# Patient Record
Sex: Female | Born: 1935 | Race: White | Hispanic: No | Smoking: Former smoker
Health system: Southern US, Community
[De-identification: ages and names within clinical notes are randomized; demographics above are authoritative.]

## PROBLEM LIST (undated history)

## (undated) DIAGNOSIS — I5032 Chronic diastolic (congestive) heart failure: Secondary | ICD-10-CM

## (undated) DIAGNOSIS — I10 Essential (primary) hypertension: Secondary | ICD-10-CM

## (undated) DIAGNOSIS — I6529 Occlusion and stenosis of unspecified carotid artery: Secondary | ICD-10-CM

## (undated) DIAGNOSIS — N200 Calculus of kidney: Secondary | ICD-10-CM

## (undated) DIAGNOSIS — E119 Type 2 diabetes mellitus without complications: Secondary | ICD-10-CM

## (undated) DIAGNOSIS — E785 Hyperlipidemia, unspecified: Secondary | ICD-10-CM

## (undated) DIAGNOSIS — T7840XA Allergy, unspecified, initial encounter: Secondary | ICD-10-CM

## (undated) DIAGNOSIS — Z8619 Personal history of other infectious and parasitic diseases: Secondary | ICD-10-CM

## (undated) DIAGNOSIS — D179 Benign lipomatous neoplasm, unspecified: Secondary | ICD-10-CM

## (undated) DIAGNOSIS — M47812 Spondylosis without myelopathy or radiculopathy, cervical region: Secondary | ICD-10-CM

## (undated) DIAGNOSIS — I639 Cerebral infarction, unspecified: Secondary | ICD-10-CM

## (undated) HISTORY — PX: LUMBAR EPIDURAL INJECTION: SHX1980

## (undated) HISTORY — DX: Personal history of other infectious and parasitic diseases: Z86.19

## (undated) HISTORY — DX: Cerebral infarction, unspecified: I63.9

## (undated) HISTORY — DX: Allergy, unspecified, initial encounter: T78.40XA

## (undated) HISTORY — PX: OTHER SURGICAL HISTORY: SHX169

## (undated) HISTORY — DX: Benign lipomatous neoplasm, unspecified: D17.9

## (undated) HISTORY — DX: Spondylosis without myelopathy or radiculopathy, cervical region: M47.812

## (undated) HISTORY — DX: Occlusion and stenosis of unspecified carotid artery: I65.29

## (undated) HISTORY — DX: Essential (primary) hypertension: I10

## (undated) HISTORY — DX: Hyperlipidemia, unspecified: E78.5

## (undated) HISTORY — DX: Calculus of kidney: N20.0

---
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
## 1961-12-27 HISTORY — PX: HERNIA REPAIR: SHX51

## 1971-12-28 HISTORY — PX: ABDOMINAL HYSTERECTOMY: SHX81

## 1973-12-27 HISTORY — PX: LUMBAR DISC SURGERY: SHX700

## 1986-12-27 HISTORY — PX: CHOLECYSTECTOMY: SHX55

## 1986-12-27 HISTORY — PX: GALLBLADDER SURGERY: SHX652

## 1988-12-27 HISTORY — PX: ANKLE RECONSTRUCTION: SHX1151

## 1992-12-27 HISTORY — PX: ROTATOR CUFF REPAIR: SHX139

## 1998-06-12 ENCOUNTER — Encounter: Admission: RE | Admit: 1998-06-12 | Discharge: 1998-09-10 | Payer: Self-pay | Admitting: Family Medicine

## 1998-12-27 HISTORY — PX: OTHER SURGICAL HISTORY: SHX169

## 1998-12-27 HISTORY — PX: SPINE SURGERY: SHX786

## 1999-10-14 ENCOUNTER — Encounter: Admission: RE | Admit: 1999-10-14 | Discharge: 1999-10-14 | Payer: Self-pay | Admitting: Family Medicine

## 1999-10-14 ENCOUNTER — Encounter: Payer: Self-pay | Admitting: Neurological Surgery

## 1999-11-06 ENCOUNTER — Encounter: Payer: Self-pay | Admitting: Neurological Surgery

## 1999-11-10 ENCOUNTER — Inpatient Hospital Stay (HOSPITAL_COMMUNITY): Admission: RE | Admit: 1999-11-10 | Discharge: 1999-11-11 | Payer: Self-pay | Admitting: Neurological Surgery

## 1999-11-10 ENCOUNTER — Encounter: Payer: Self-pay | Admitting: Neurological Surgery

## 1999-12-03 ENCOUNTER — Encounter: Admission: RE | Admit: 1999-12-03 | Discharge: 1999-12-03 | Payer: Self-pay | Admitting: Neurological Surgery

## 1999-12-03 ENCOUNTER — Encounter: Payer: Self-pay | Admitting: Neurological Surgery

## 2000-01-11 ENCOUNTER — Encounter: Admission: RE | Admit: 2000-01-11 | Discharge: 2000-01-11 | Payer: Self-pay | Admitting: Neurological Surgery

## 2000-01-11 ENCOUNTER — Encounter: Payer: Self-pay | Admitting: Neurological Surgery

## 2000-08-11 ENCOUNTER — Encounter: Payer: Self-pay | Admitting: Orthopedic Surgery

## 2000-08-11 ENCOUNTER — Encounter: Admission: RE | Admit: 2000-08-11 | Discharge: 2000-08-11 | Payer: Self-pay | Admitting: Orthopedic Surgery

## 2000-12-27 HISTORY — PX: STOMACH SURGERY: SHX791

## 2001-08-18 ENCOUNTER — Encounter: Admission: RE | Admit: 2001-08-18 | Discharge: 2001-08-18 | Payer: Self-pay | Admitting: Orthopedic Surgery

## 2001-08-18 ENCOUNTER — Encounter: Payer: Self-pay | Admitting: Orthopedic Surgery

## 2001-09-27 ENCOUNTER — Ambulatory Visit: Admission: RE | Admit: 2001-09-27 | Discharge: 2001-09-27 | Payer: Self-pay | Admitting: Gynecology

## 2001-10-11 ENCOUNTER — Encounter: Payer: Self-pay | Admitting: Gynecology

## 2001-10-17 ENCOUNTER — Encounter (INDEPENDENT_AMBULATORY_CARE_PROVIDER_SITE_OTHER): Payer: Self-pay

## 2001-10-17 ENCOUNTER — Inpatient Hospital Stay (HOSPITAL_COMMUNITY): Admission: RE | Admit: 2001-10-17 | Discharge: 2001-10-20 | Payer: Self-pay | Admitting: Gynecology

## 2001-10-17 ENCOUNTER — Encounter (INDEPENDENT_AMBULATORY_CARE_PROVIDER_SITE_OTHER): Payer: Self-pay | Admitting: Specialist

## 2001-12-27 HISTORY — PX: SPINE SURGERY: SHX786

## 2002-05-07 ENCOUNTER — Inpatient Hospital Stay (HOSPITAL_COMMUNITY): Admission: RE | Admit: 2002-05-07 | Discharge: 2002-05-08 | Payer: Self-pay | Admitting: Neurological Surgery

## 2002-05-07 ENCOUNTER — Encounter: Payer: Self-pay | Admitting: Neurological Surgery

## 2002-05-18 ENCOUNTER — Ambulatory Visit (HOSPITAL_COMMUNITY): Admission: RE | Admit: 2002-05-18 | Discharge: 2002-05-18 | Payer: Self-pay | Admitting: Family Medicine

## 2002-05-18 ENCOUNTER — Encounter: Payer: Self-pay | Admitting: Family Medicine

## 2003-10-28 ENCOUNTER — Ambulatory Visit (HOSPITAL_COMMUNITY): Admission: RE | Admit: 2003-10-28 | Discharge: 2003-10-28 | Payer: Self-pay | Admitting: Gastroenterology

## 2003-12-28 HISTORY — PX: FOOT FUSION: SHX956

## 2004-01-14 ENCOUNTER — Ambulatory Visit (HOSPITAL_BASED_OUTPATIENT_CLINIC_OR_DEPARTMENT_OTHER): Admission: RE | Admit: 2004-01-14 | Discharge: 2004-01-14 | Payer: Self-pay | Admitting: Orthopedic Surgery

## 2004-01-14 ENCOUNTER — Ambulatory Visit (HOSPITAL_COMMUNITY): Admission: RE | Admit: 2004-01-14 | Discharge: 2004-01-14 | Payer: Self-pay | Admitting: Orthopedic Surgery

## 2004-11-29 ENCOUNTER — Inpatient Hospital Stay (HOSPITAL_COMMUNITY): Admission: EM | Admit: 2004-11-29 | Discharge: 2004-11-30 | Payer: Self-pay | Admitting: Emergency Medicine

## 2004-12-14 ENCOUNTER — Ambulatory Visit: Payer: Self-pay

## 2004-12-14 ENCOUNTER — Ambulatory Visit: Payer: Self-pay | Admitting: Cardiology

## 2004-12-24 ENCOUNTER — Ambulatory Visit: Payer: Self-pay

## 2006-06-02 ENCOUNTER — Encounter: Admission: RE | Admit: 2006-06-02 | Discharge: 2006-06-02 | Payer: Self-pay | Admitting: Neurological Surgery

## 2007-02-03 ENCOUNTER — Ambulatory Visit: Payer: Self-pay | Admitting: Cardiology

## 2007-02-03 ENCOUNTER — Ambulatory Visit: Payer: Self-pay

## 2007-06-21 ENCOUNTER — Encounter (INDEPENDENT_AMBULATORY_CARE_PROVIDER_SITE_OTHER): Payer: Self-pay | Admitting: Family Medicine

## 2007-06-21 ENCOUNTER — Ambulatory Visit: Payer: Self-pay | Admitting: Vascular Surgery

## 2007-06-21 ENCOUNTER — Ambulatory Visit (HOSPITAL_COMMUNITY): Admission: RE | Admit: 2007-06-21 | Discharge: 2007-06-22 | Payer: Self-pay | Admitting: Family Medicine

## 2007-10-26 ENCOUNTER — Ambulatory Visit: Payer: Self-pay

## 2008-02-21 ENCOUNTER — Ambulatory Visit: Payer: Self-pay | Admitting: Cardiology

## 2008-10-16 ENCOUNTER — Encounter: Admission: RE | Admit: 2008-10-16 | Discharge: 2008-10-16 | Payer: Self-pay | Admitting: Orthopedic Surgery

## 2008-10-29 ENCOUNTER — Ambulatory Visit: Payer: Self-pay

## 2008-11-26 DIAGNOSIS — Z8619 Personal history of other infectious and parasitic diseases: Secondary | ICD-10-CM

## 2008-11-26 HISTORY — DX: Personal history of other infectious and parasitic diseases: Z86.19

## 2008-12-11 ENCOUNTER — Ambulatory Visit: Payer: Self-pay

## 2008-12-11 ENCOUNTER — Encounter (INDEPENDENT_AMBULATORY_CARE_PROVIDER_SITE_OTHER): Payer: Self-pay | Admitting: Family Medicine

## 2008-12-27 HISTORY — PX: SPINE SURGERY: SHX786

## 2009-02-21 ENCOUNTER — Ambulatory Visit: Payer: Self-pay | Admitting: Cardiology

## 2009-02-28 ENCOUNTER — Ambulatory Visit: Payer: Self-pay | Admitting: Cardiology

## 2009-02-28 LAB — CONVERTED CEMR LAB
Calcium: 10.1 mg/dL (ref 8.4–10.5)
GFR calc Af Amer: 63 mL/min
GFR calc non Af Amer: 52 mL/min
Potassium: 3.6 meq/L (ref 3.5–5.1)
Sodium: 143 meq/L (ref 135–145)

## 2009-06-11 ENCOUNTER — Encounter: Admission: RE | Admit: 2009-06-11 | Discharge: 2009-06-11 | Payer: Self-pay | Admitting: Neurological Surgery

## 2009-08-21 ENCOUNTER — Inpatient Hospital Stay (HOSPITAL_COMMUNITY): Admission: AD | Admit: 2009-08-21 | Discharge: 2009-08-22 | Payer: Self-pay | Admitting: Neurological Surgery

## 2009-11-05 ENCOUNTER — Ambulatory Visit: Payer: Self-pay

## 2009-11-05 ENCOUNTER — Encounter: Payer: Self-pay | Admitting: Cardiology

## 2009-12-03 ENCOUNTER — Encounter: Admission: RE | Admit: 2009-12-03 | Discharge: 2009-12-03 | Payer: Self-pay | Admitting: Family Medicine

## 2009-12-04 ENCOUNTER — Encounter: Admission: RE | Admit: 2009-12-04 | Discharge: 2009-12-04 | Payer: Self-pay | Admitting: Family Medicine

## 2009-12-27 DIAGNOSIS — I639 Cerebral infarction, unspecified: Secondary | ICD-10-CM

## 2009-12-27 DIAGNOSIS — I5032 Chronic diastolic (congestive) heart failure: Secondary | ICD-10-CM

## 2009-12-27 HISTORY — DX: Cerebral infarction, unspecified: I63.9

## 2009-12-27 HISTORY — DX: Chronic diastolic (congestive) heart failure: I50.32

## 2010-01-08 ENCOUNTER — Inpatient Hospital Stay (HOSPITAL_COMMUNITY): Admission: EM | Admit: 2010-01-08 | Discharge: 2010-01-16 | Payer: Self-pay | Admitting: Emergency Medicine

## 2010-01-08 ENCOUNTER — Ambulatory Visit: Payer: Self-pay | Admitting: Cardiovascular Disease

## 2010-01-09 ENCOUNTER — Ambulatory Visit: Payer: Self-pay | Admitting: Infectious Disease

## 2010-01-14 ENCOUNTER — Ambulatory Visit: Payer: Self-pay | Admitting: Vascular Surgery

## 2010-01-15 ENCOUNTER — Encounter: Payer: Self-pay | Admitting: Infectious Disease

## 2010-01-15 ENCOUNTER — Ambulatory Visit: Payer: Self-pay | Admitting: Internal Medicine

## 2010-01-15 ENCOUNTER — Encounter (INDEPENDENT_AMBULATORY_CARE_PROVIDER_SITE_OTHER): Payer: Self-pay | Admitting: Pediatrics

## 2010-01-15 ENCOUNTER — Ambulatory Visit: Payer: Self-pay | Admitting: Physical Medicine & Rehabilitation

## 2010-01-16 ENCOUNTER — Ambulatory Visit: Payer: Self-pay | Admitting: Physical Medicine & Rehabilitation

## 2010-01-16 ENCOUNTER — Inpatient Hospital Stay (HOSPITAL_COMMUNITY)
Admission: EM | Admit: 2010-01-16 | Discharge: 2010-02-06 | Payer: Self-pay | Admitting: Physical Medicine & Rehabilitation

## 2010-01-19 ENCOUNTER — Encounter: Payer: Self-pay | Admitting: Physical Medicine & Rehabilitation

## 2010-01-31 ENCOUNTER — Encounter: Payer: Self-pay | Admitting: Internal Medicine

## 2010-02-03 ENCOUNTER — Ambulatory Visit: Payer: Self-pay | Admitting: Infectious Disease

## 2010-02-08 ENCOUNTER — Telehealth: Payer: Self-pay | Admitting: Infectious Disease

## 2010-02-08 DIAGNOSIS — M519 Unspecified thoracic, thoracolumbar and lumbosacral intervertebral disc disorder: Secondary | ICD-10-CM

## 2010-02-08 DIAGNOSIS — B029 Zoster without complications: Secondary | ICD-10-CM | POA: Insufficient documentation

## 2010-02-18 ENCOUNTER — Encounter: Payer: Self-pay | Admitting: Infectious Disease

## 2010-02-23 ENCOUNTER — Encounter: Payer: Self-pay | Admitting: Infectious Diseases

## 2010-02-26 ENCOUNTER — Encounter: Payer: Self-pay | Admitting: Infectious Disease

## 2010-03-02 ENCOUNTER — Ambulatory Visit: Payer: Self-pay | Admitting: Infectious Disease

## 2010-03-02 DIAGNOSIS — M5030 Other cervical disc degeneration, unspecified cervical region: Secondary | ICD-10-CM | POA: Insufficient documentation

## 2010-03-02 DIAGNOSIS — I6350 Cerebral infarction due to unspecified occlusion or stenosis of unspecified cerebral artery: Secondary | ICD-10-CM | POA: Insufficient documentation

## 2010-03-02 DIAGNOSIS — I82409 Acute embolism and thrombosis of unspecified deep veins of unspecified lower extremity: Secondary | ICD-10-CM | POA: Insufficient documentation

## 2010-03-02 DIAGNOSIS — M5137 Other intervertebral disc degeneration, lumbosacral region: Secondary | ICD-10-CM | POA: Insufficient documentation

## 2010-03-20 ENCOUNTER — Encounter
Admission: RE | Admit: 2010-03-20 | Discharge: 2010-03-25 | Payer: Self-pay | Admitting: Physical Medicine & Rehabilitation

## 2010-03-25 ENCOUNTER — Ambulatory Visit: Payer: Self-pay | Admitting: Physical Medicine & Rehabilitation

## 2010-05-27 ENCOUNTER — Ambulatory Visit (HOSPITAL_BASED_OUTPATIENT_CLINIC_OR_DEPARTMENT_OTHER): Admission: RE | Admit: 2010-05-27 | Discharge: 2010-05-27 | Payer: Self-pay | Admitting: Urology

## 2010-06-05 ENCOUNTER — Encounter: Payer: Self-pay | Admitting: Cardiology

## 2010-06-15 ENCOUNTER — Ambulatory Visit (HOSPITAL_COMMUNITY): Admission: RE | Admit: 2010-06-15 | Discharge: 2010-06-15 | Payer: Self-pay | Admitting: Urology

## 2010-08-18 ENCOUNTER — Encounter: Payer: Self-pay | Admitting: Cardiology

## 2010-08-20 ENCOUNTER — Ambulatory Visit: Payer: Self-pay

## 2010-08-20 ENCOUNTER — Encounter: Payer: Self-pay | Admitting: Cardiovascular Disease

## 2010-09-01 ENCOUNTER — Encounter: Admission: RE | Admit: 2010-09-01 | Discharge: 2010-09-01 | Payer: Self-pay | Admitting: Emergency Medicine

## 2010-10-29 ENCOUNTER — Encounter: Admission: RE | Admit: 2010-10-29 | Discharge: 2010-10-29 | Payer: Self-pay | Admitting: Family Medicine

## 2010-12-27 DIAGNOSIS — N200 Calculus of kidney: Secondary | ICD-10-CM

## 2010-12-27 HISTORY — DX: Calculus of kidney: N20.0

## 2011-01-26 NOTE — Assessment & Plan Note (Signed)
Summary: [facility] need chart inf in spine/kam   Primary Provider:  Blomgren  CC:  follow up from hospital infection in spine.  History of Present Illness: 75 year old lady with PMHX  significant for carotid stenosis, cerebrovascular  disease, hypertension, hyperlipidemia, cervical spondylosis,  radiculopathy, and myelopathy who developed a zoster rash in mid  December.  She was treated with Depo-Medrol injection, Valtrex, Lidoderm   patches and prednisone.  Neuropathic pain worsened.  In addition to  this, she developed progressive weakness and was unable to walk. She was admitted to the hospitalist service and given IV acyclovir as welll as systemic unasyn for what was believed to be superinfection of her VZV. I  took her offf antibacterial therapy and continued acyclovir and obtained MRI of spine to exclude evidence of VZV related myelitis. Her MRI instead was suggestive of L3-4 diskitis wit " a change in appearance of the degenerated L3-4 disc which now has a fluidsignal intensity.  The irregularity of the surrounding endplate  does not appear to have progressed since prior exam however,  diskitis is of concern given the additional finding of new edema within the medial aspect of the adjacent left psoas muscle. SHe underwent aspiration of this disk space twice off antibiotics and both aspirates failed to yiedl an organims on bacterial culture. Her AFB and fungal cultures from the 2nd aspirate also faield to yield an organism. I ultimately decided to treate her for possible diskitis with cirpofloxacin and vancomycin (there had been concern for beta lactams possibly having caused thrombocytopenia, though with retrospect I thimnk ie may have been her acyclovir. She also completed at 21 day course of acyclovir and valtrex for possible VZV myelitis though on MRI there really was no good evidence for this. She went to the rehab floor and was ultimately dc to home.  IN the interim she was also found to have DVT and  is on coumadin for this at present. She has regained majority of her strenght and can actually walk witout a walker now though she does not actually do this regularly --she demonstrates this for me. She has  had no fevers, chills, nauasea or maliase. She still does have back pain with prolonged standing and inquires if she could ask Dr. Danielle Dess to inject her back with steroids again. I explained that this would entail an increased risk of infection due to insertion of needle and injection of immunosuppressant but that Iwould pass on my note to him and would welcome his review of this case as well.  Preventive Screening-Counseling & Management  Alcohol-Tobacco     Alcohol drinks/day: 0     Smoking Status: quit > 6 months  Caffeine-Diet-Exercise     Caffeine use/day: coffee one day  Past History:  Past Medical History:  1. Bilateral carotid stenosis.   2. Cerebrovascular disease.   3. Hypertension.   4. Hyperlipidemia.   5. DVT.   6. Cervical spondylosis with radiculopathy and myelopathy.  Follows up       with Dr. Danielle Dess.   7. Endometriosis with endometrioma.   8. Migraine headaches.   9. History of vasovagal syncope 10. Zoster 11. Possible diskitis as described above.      Past Surgical History:  1. Back surgery.   2. History of cholecystectomy  Family History: noncontributory  Social History: no alcohol, quite smoking 6 months ago  Review of Systems       The patient complains of difficulty walking.  The patient denies anorexia, fever, weight loss,  weight gain, vision loss, decreased hearing, hoarseness, chest pain, syncope, dyspnea on exertion, peripheral edema, prolonged cough, headaches, hemoptysis, abdominal pain, melena, hematochezia, severe indigestion/heartburn, hematuria, incontinence, genital sores, muscle weakness, suspicious skin lesions, transient blindness, depression, unusual weight change, abnormal bleeding, and enlarged lymph nodes.    Vital  Signs:  Patient profile:   74 year old female Height:      59.5 inches (151.13 cm) Weight:      146.1 pounds (66.41 kg) BMI:     29.12 Temp:     96.8 degrees F (36 degrees C) oral Pulse rate:   72 / minute BP sitting:   126 / 62  (left arm)  Vitals Entered By: Wendall Mola CMA Duncan Dull) (March 02, 2010 3:54 PM) CC: follow up from hospital infection in spine Is Patient Diabetic? Yes Pain Assessment Patient in pain? no      Nutritional Status BMI of 25 - 29 = overweight Nutritional Status Detail appetite pretty good  Does patient need assistance? Functional Status Cook/clean, Shopping, Social activities Ambulation Impaired:Risk for fall Comments pt uses walker   Physical Exam  General:  alert, well-developed, well-nourished, and well-hydrated.   Head:  normocephalic, atraumatic, and no abnormalities observed.   Eyes:  vision grossly intact, pupils equal, pupils round, and pupils reactive to light.   Ears:  no external deformities and ear piercing(s) noted.   Nose:  no external deformity and no external erythema.   Mouth:  pharynx pink and moist, no erythema, and no exudates.   Neck:  supple and full ROM.   Lungs:  normal respiratory effort, no crackles, and no wheezes.   Heart:  normal rate, regular rhythm, no murmur, and no gallop.   Abdomen:  soft, non-tender, no masses, and no splenomegaly.   Extremities:  bilateral 2+ pretibial edema worse on the left Neurologic:  alert & oriented X3 and strength normal in all extremities.  abe to walk without the walker Skin:  she has darker pigmented areas in region of healed VZV Psych:  Oriented X3, memory intact for recent and remote, normally interactive, and good eye contact.     Impression & Recommendations:  Problem # 1:  DISCITIS (ICD-722.90)  Diagnosis made based on MRI in context of her new onset weakness but complicated byt the fact that she had painful zoster involving her left  thigh the pain of which may have  contributed to her disuse weakenss and difficulty walking. Aspirates never yileded an organims x2 Will recheck her esr an crp. NO further antibiotics  Orders: Est. Patient Level IV (04540)  Problem # 2:  SHINGLES (ICD-053.9)  resolved. No MRI evidence for myelitis but she received a protracted course of IV acyclovir changed to valtrex  Orders: Est. Patient Level IV (98119)  Problem # 3:  DEGENERATIVE DISC DISEASE, CERVICAL SPINE (ICD-722.4)  She is wishing for corticosteroid injection by Dr, Danielle Dess. I did explain that there is risk to corticosteroid injection from an ID standpoint but said I would pass on her request to Dr. Danielle Dess  Orders: Est. Patient Level IV (14782)  Problem # 4:  DEGENERATIVE DISC DISEASE, LUMBAR SPINE (ICD-722.52)  see above discussion. She would also benefit from review of her films by Dr. Danielle Dess  Orders: Est. Patient Level IV (95621)  Problem # 5:  CVA (ICD-434.91)  MRI of brain showed some new lesions, I did not suspect VZV per se of being involved with these mR detected lesions Her updated medication list for this problem  includes:    Warfarin Sodium 1 Mg Tabs (Warfarin sodium) .Marland Kitchen... Per md  Orders: Est. Patient Level IV (16109)  Problem # 6:  DEEP VENOUS THROMBOPHLEBITIS, LEG, LEFT (ICD-453.40)  she is on coumadin for this and her PCP is going to need to assume care of this from Dr. Hermelinda Medicus and consider hypercoagulable workup  Orders: Est. Patient Level IV (60454)  Medications Added to Medication List This Visit: 1)  Warfarin Sodium 1 Mg Tabs (Warfarin sodium) .... Per md 2)  Atenolol 50 Mg Tabs (Atenolol) .... Take 1 tablet by mouth once a day per pcp 3)  Cozaar 100 Mg Tabs (Losartan potassium) .... Take 1 tablet by mouth once a day 4)  Lipitor 80 Mg Tabs (Atorvastatin calcium) .... .qdtab 5)  Diltiazem Hcl Cr 240 Mg Xr24h-cap (Diltiazem hcl) .... Take 1 tablet by mouth once a day 6)  Omeprazole 20 Mg Tbec (Omeprazole) .... Take 1 tablet by  mouth once a day 7)  Lorazepam 1 Mg Tabs (Lorazepam) .... Take 1 tablet by mouth at bedtime 8)  Gabapentin 600 Mg Tabs (Gabapentin) .... Take 1/2 tablet by mouth at bedtime

## 2011-01-26 NOTE — Progress Notes (Signed)
Summary: [facility]  [facility]   Imported By: Debby Freiberg 06/20/2010 11:02:13  _____________________________________________________________________  External Attachment:    Type:   Image     Comment:   External Document

## 2011-01-26 NOTE — Progress Notes (Signed)
Summary: Care Plan Oversight  Phone Note Outgoing Call   Call placed by: Acey Lav MD,  February 08, 2010 11:13 PM Details for Reason: Care Plan Oversight Summary of Call: [phone] mins) [phone] (30 or more mins) [phone](> 60 mins) I have supervised home care and/or infusion therapy for this pt, including providing orders for care, review of labs and/or home health care plans, communicating with the home health care professionals and/or patient/caregivers to integrate current information into the medical treatment plan and/or adjust the medical therapy. This supervision has been provided for 32__minutes during the calendar month. Dates for this oversight  02/03/10 thru 3/8/11__.   Initial call taken by: Acey Lav MD,  February 08, 2010 11:14 PM  New Problems: SHINGLES (ICD-053.9) DISCITIS (ICD-722.90)   New Problems: SHINGLES (ICD-053.9) DISCITIS (ICD-722.90) New/Updated Medications: VANCOMYCIN HCL IN DEXTROSE 1 GM/200ML SOLN (VANCOMYCIN HCL IN DEXTROSE) dosed per pharmaacy CIPRO 500 MG TABS (CIPROFLOXACIN HCL) Take 1 tablet by mouth two times a day

## 2011-01-26 NOTE — Miscellaneous (Signed)
Summary: [facility]: Orders  [facility]: Orders   Imported By: Florinda Marker 03/17/2010 14:30:57  _____________________________________________________________________  External Attachment:    Type:   Image     Comment:   External Document

## 2011-01-26 NOTE — Letter (Signed)
Summary: [facility] Order  [facility] Order   Imported By: Roderic Ovens 09/18/2010 15:24:44  _____________________________________________________________________  External Attachment:    Type:   Image     Comment:   External Document

## 2011-02-19 ENCOUNTER — Encounter (INDEPENDENT_AMBULATORY_CARE_PROVIDER_SITE_OTHER): Payer: Medicare Other

## 2011-02-19 ENCOUNTER — Encounter: Payer: Self-pay | Admitting: Cardiovascular Disease

## 2011-02-19 DIAGNOSIS — I6529 Occlusion and stenosis of unspecified carotid artery: Secondary | ICD-10-CM

## 2011-02-23 NOTE — Miscellaneous (Signed)
Summary: Orders Update  Clinical Lists Changes  Problems: Added new problem of CAROTID ARTERY DISEASE (ICD-433.10) Orders: Added new Test order of Carotid Duplex (Carotid Duplex) - Signed 

## 2011-03-14 LAB — COMPREHENSIVE METABOLIC PANEL
ALT: 24 U/L (ref 0–35)
ALT: 32 U/L (ref 0–35)
ALT: 33 U/L (ref 0–35)
ALT: 36 U/L — ABNORMAL HIGH (ref 0–35)
AST: 20 U/L (ref 0–37)
Albumin: 2.2 g/dL — ABNORMAL LOW (ref 3.5–5.2)
Albumin: 2.3 g/dL — ABNORMAL LOW (ref 3.5–5.2)
Alkaline Phosphatase: 49 U/L (ref 39–117)
Alkaline Phosphatase: 51 U/L (ref 39–117)
BUN: 40 mg/dL — ABNORMAL HIGH (ref 6–23)
BUN: 14 mg/dL (ref 6–23)
BUN: 20 mg/dL (ref 6–23)
BUN: 8 mg/dL (ref 6–23)
CO2: 23 mEq/L (ref 19–32)
CO2: 26 mEq/L (ref 19–32)
CO2: 31 mEq/L (ref 19–32)
Calcium: 7.4 mg/dL — ABNORMAL LOW (ref 8.4–10.5)
Calcium: 8.2 mg/dL — ABNORMAL LOW (ref 8.4–10.5)
Calcium: 7.3 mg/dL — ABNORMAL LOW (ref 8.4–10.5)
Calcium: 8 mg/dL — ABNORMAL LOW (ref 8.4–10.5)
Chloride: 103 mEq/L (ref 96–112)
Chloride: 99 mEq/L (ref 96–112)
Creatinine, Ser: 1.36 mg/dL — ABNORMAL HIGH (ref 0.4–1.2)
Creatinine, Ser: 1.78 mg/dL — ABNORMAL HIGH (ref 0.4–1.2)
Creatinine, Ser: 1.02 mg/dL (ref 0.4–1.2)
GFR calc Af Amer: 34 mL/min — ABNORMAL LOW (ref >60)
GFR calc non Af Amer: 28 mL/min — ABNORMAL LOW (ref >60)
GFR calc non Af Amer: 38 mL/min — ABNORMAL LOW (ref >60)
Glucose, Bld: 100 mg/dL — ABNORMAL HIGH (ref 70–99)
Glucose, Bld: 113 mg/dL — ABNORMAL HIGH (ref 70–99)
Glucose, Bld: 127 mg/dL — ABNORMAL HIGH (ref 70–99)
Glucose, Bld: 88 mg/dL (ref 70–99)
Glucose, Bld: 97 mg/dL (ref 70–99)
Potassium: 3.2 mEq/L — ABNORMAL LOW (ref 3.5–5.1)
Potassium: 3.4 mEq/L — ABNORMAL LOW (ref 3.5–5.1)
Potassium: 4 mEq/L (ref 3.5–5.1)
Sodium: 133 mEq/L — ABNORMAL LOW (ref 135–145)
Sodium: 134 mEq/L — ABNORMAL LOW (ref 135–145)
Sodium: 136 mEq/L (ref 135–145)
Sodium: 140 mEq/L (ref 135–145)
Total Bilirubin: 0.7 mg/dL (ref 0.3–1.2)
Total Bilirubin: 0.7 mg/dL (ref 0.3–1.2)
Total Protein: 4.3 g/dL — ABNORMAL LOW (ref 6.0–8.3)
Total Protein: 4.5 g/dL — ABNORMAL LOW (ref 6.0–8.3)
Total Protein: 4 g/dL — ABNORMAL LOW (ref 6.0–8.3)
Total Protein: 4.2 g/dL — ABNORMAL LOW (ref 6.0–8.3)
Total Protein: 4.6 g/dL — ABNORMAL LOW (ref 6.0–8.3)

## 2011-03-14 LAB — BASIC METABOLIC PANEL
BUN: 11 mg/dL (ref 6–23)
BUN: 7 mg/dL (ref 6–23)
BUN: 8 mg/dL (ref 6–23)
BUN: 9 mg/dL (ref 6–23)
CO2: 23 mEq/L (ref 19–32)
CO2: 24 mEq/L (ref 19–32)
CO2: 26 mEq/L (ref 19–32)
CO2: 27 mEq/L (ref 19–32)
CO2: 27 mEq/L (ref 19–32)
CO2: 28 mEq/L (ref 19–32)
CO2: 29 mEq/L (ref 19–32)
Calcium: 9.1 mg/dL (ref 8.4–10.5)
Calcium: 7.3 mg/dL — ABNORMAL LOW (ref 8.4–10.5)
Calcium: 7.8 mg/dL — ABNORMAL LOW (ref 8.4–10.5)
Calcium: 8 mg/dL — ABNORMAL LOW (ref 8.4–10.5)
Calcium: 8 mg/dL — ABNORMAL LOW (ref 8.4–10.5)
Calcium: 8.1 mg/dL — ABNORMAL LOW (ref 8.4–10.5)
Calcium: 8.3 mg/dL — ABNORMAL LOW (ref 8.4–10.5)
Calcium: 8.3 mg/dL — ABNORMAL LOW (ref 8.4–10.5)
Chloride: 101 mEq/L (ref 96–112)
Chloride: 102 mEq/L (ref 96–112)
Chloride: 108 mEq/L (ref 96–112)
Chloride: 111 mEq/L (ref 96–112)
Chloride: 111 mEq/L (ref 96–112)
Chloride: 114 mEq/L — ABNORMAL HIGH (ref 96–112)
Creatinine, Ser: 0.69 mg/dL (ref 0.4–1.2)
Creatinine, Ser: 0.71 mg/dL (ref 0.4–1.2)
Creatinine, Ser: 0.72 mg/dL (ref 0.4–1.2)
Creatinine, Ser: 0.84 mg/dL (ref 0.4–1.2)
Creatinine, Ser: 0.84 mg/dL (ref 0.4–1.2)
Creatinine, Ser: 0.97 mg/dL (ref 0.4–1.2)
GFR calc Af Amer: >60 mL/min (ref >60)
GFR calc Af Amer: >60 mL/min (ref >60)
GFR calc Af Amer: >60 mL/min (ref >60)
GFR calc Af Amer: >60 mL/min (ref >60)
GFR calc Af Amer: >60 mL/min (ref >60)
GFR calc Af Amer: >60 mL/min (ref >60)
GFR calc non Af Amer: 32 mL/min — ABNORMAL LOW (ref >60)
GFR calc non Af Amer: 53 mL/min — ABNORMAL LOW (ref >60)
GFR calc non Af Amer: >60 mL/min (ref >60)
GFR calc non Af Amer: >60 mL/min (ref >60)
GFR calc non Af Amer: >60 mL/min (ref >60)
GFR calc non Af Amer: >60 mL/min (ref >60)
GFR calc non Af Amer: >60 mL/min (ref >60)
Glucose, Bld: 395 mg/dL — ABNORMAL HIGH (ref 70–99)
Glucose, Bld: 100 mg/dL — ABNORMAL HIGH (ref 70–99)
Glucose, Bld: 101 mg/dL — ABNORMAL HIGH (ref 70–99)
Glucose, Bld: 111 mg/dL — ABNORMAL HIGH (ref 70–99)
Glucose, Bld: 95 mg/dL (ref 70–99)
Glucose, Bld: 98 mg/dL (ref 70–99)
Glucose, Bld: 99 mg/dL (ref 70–99)
Potassium: 3.6 mEq/L (ref 3.5–5.1)
Potassium: 3.2 mEq/L — ABNORMAL LOW (ref 3.5–5.1)
Potassium: 3.7 mEq/L (ref 3.5–5.1)
Potassium: 3.9 mEq/L (ref 3.5–5.1)
Potassium: 4 mEq/L (ref 3.5–5.1)
Potassium: 4.2 mEq/L (ref 3.5–5.1)
Potassium: 4.5 mEq/L (ref 3.5–5.1)
Sodium: 131 mEq/L — ABNORMAL LOW (ref 135–145)
Sodium: 134 mEq/L — ABNORMAL LOW (ref 135–145)
Sodium: 135 mEq/L (ref 135–145)
Sodium: 140 mEq/L (ref 135–145)
Sodium: 140 mEq/L (ref 135–145)
Sodium: 140 mEq/L (ref 135–145)
Sodium: 142 mEq/L (ref 135–145)

## 2011-03-14 LAB — CBC
HCT: 34.4 % — ABNORMAL LOW (ref 36.0–46.0)
HCT: 43.4 % (ref 36.0–46.0)
HCT: 25.7 % — ABNORMAL LOW (ref 36.0–46.0)
HCT: 27.4 % — ABNORMAL LOW (ref 36.0–46.0)
HCT: 28 % — ABNORMAL LOW (ref 36.0–46.0)
HCT: 28.4 % — ABNORMAL LOW (ref 36.0–46.0)
HCT: 30.7 % — ABNORMAL LOW (ref 36.0–46.0)
HCT: 31.6 % — ABNORMAL LOW (ref 36.0–46.0)
HCT: 33.2 % — ABNORMAL LOW (ref 36.0–46.0)
HCT: 34.2 % — ABNORMAL LOW (ref 36.0–46.0)
HCT: 34.9 % — ABNORMAL LOW (ref 36.0–46.0)
Hemoglobin: 12.1 g/dL (ref 12.0–15.0)
Hemoglobin: 15 g/dL (ref 12.0–15.0)
Hemoglobin: 10.6 g/dL — ABNORMAL LOW (ref 12.0–15.0)
Hemoglobin: 10.7 g/dL — ABNORMAL LOW (ref 12.0–15.0)
Hemoglobin: 10.8 g/dL — ABNORMAL LOW (ref 12.0–15.0)
Hemoglobin: 11.5 g/dL — ABNORMAL LOW (ref 12.0–15.0)
Hemoglobin: 11.6 g/dL — ABNORMAL LOW (ref 12.0–15.0)
Hemoglobin: 11.8 g/dL — ABNORMAL LOW (ref 12.0–15.0)
Hemoglobin: 11.9 g/dL — ABNORMAL LOW (ref 12.0–15.0)
Hemoglobin: 9.4 g/dL — ABNORMAL LOW (ref 12.0–15.0)
Hemoglobin: 9.5 g/dL — ABNORMAL LOW (ref 12.0–15.0)
Hemoglobin: 9.9 g/dL — ABNORMAL LOW (ref 12.0–15.0)
MCHC: 35.1 g/dL (ref 30.0–36.0)
MCHC: 35.3 g/dL (ref 30.0–36.0)
MCHC: 33.9 g/dL (ref 30.0–36.0)
MCHC: 34 g/dL (ref 30.0–36.0)
MCHC: 34.6 g/dL (ref 30.0–36.0)
MCHC: 34.6 g/dL (ref 30.0–36.0)
MCHC: 34.7 g/dL (ref 30.0–36.0)
MCHC: 34.7 g/dL (ref 30.0–36.0)
MCHC: 34.8 g/dL (ref 30.0–36.0)
MCHC: 35 g/dL (ref 30.0–36.0)
MCHC: 35.1 g/dL (ref 30.0–36.0)
MCHC: 35.5 g/dL (ref 30.0–36.0)
MCV: 94.2 fL (ref 78.0–100.0)
MCV: 95 fL (ref 78.0–100.0)
MCV: 100.4 fL — ABNORMAL HIGH (ref 78.0–100.0)
MCV: 93.8 fL (ref 78.0–100.0)
MCV: 95.4 fL (ref 78.0–100.0)
MCV: 95.4 fL (ref 78.0–100.0)
MCV: 96.7 fL (ref 78.0–100.0)
MCV: 96.8 fL (ref 78.0–100.0)
MCV: 98.4 fL (ref 78.0–100.0)
MCV: 99.1 fL (ref 78.0–100.0)
Platelets: 117 10*3/uL — ABNORMAL LOW (ref 150–400)
Platelets: 108 10*3/uL — ABNORMAL LOW (ref 150–400)
Platelets: 128 10*3/uL — ABNORMAL LOW (ref 150–400)
Platelets: 183 10*3/uL (ref 150–400)
Platelets: 71 10*3/uL — ABNORMAL LOW (ref 150–400)
Platelets: 79 10*3/uL — ABNORMAL LOW (ref 150–400)
Platelets: 85 10*3/uL — ABNORMAL LOW (ref 150–400)
RBC: 3.62 MIL/uL — ABNORMAL LOW (ref 3.87–5.11)
RBC: 3.62 MIL/uL — ABNORMAL LOW (ref 3.87–5.11)
RBC: 2.44 MIL/uL — ABNORMAL LOW (ref 3.87–5.11)
RBC: 2.74 MIL/uL — ABNORMAL LOW (ref 3.87–5.11)
RBC: 2.79 MIL/uL — ABNORMAL LOW (ref 3.87–5.11)
RBC: 3.2 MIL/uL — ABNORMAL LOW (ref 3.87–5.11)
RBC: 3.26 MIL/uL — ABNORMAL LOW (ref 3.87–5.11)
RBC: 3.29 MIL/uL — ABNORMAL LOW (ref 3.87–5.11)
RBC: 3.58 MIL/uL — ABNORMAL LOW (ref 3.87–5.11)
RBC: 3.63 MIL/uL — ABNORMAL LOW (ref 3.87–5.11)
RDW: 14.7 % (ref 11.5–15.5)
RDW: 15.1 % (ref 11.5–15.5)
RDW: 15.5 % (ref 11.5–15.5)
RDW: 15.6 % — ABNORMAL HIGH (ref 11.5–15.5)
RDW: 15.8 % — ABNORMAL HIGH (ref 11.5–15.5)
RDW: 15.8 % — ABNORMAL HIGH (ref 11.5–15.5)
RDW: 16.1 % — ABNORMAL HIGH (ref 11.5–15.5)
RDW: 16.2 % — ABNORMAL HIGH (ref 11.5–15.5)
RDW: 16.4 % — ABNORMAL HIGH (ref 11.5–15.5)
RDW: 16.6 % — ABNORMAL HIGH (ref 11.5–15.5)
RDW: 16.6 % — ABNORMAL HIGH (ref 11.5–15.5)
RDW: 18.5 % — ABNORMAL HIGH (ref 11.5–15.5)
RDW: 20.3 % — ABNORMAL HIGH (ref 11.5–15.5)
WBC: 14.9 10*3/uL — ABNORMAL HIGH (ref 4.0–10.5)
WBC: 11.3 10*3/uL — ABNORMAL HIGH (ref 4.0–10.5)
WBC: 4.6 10*3/uL (ref 4.0–10.5)
WBC: 5.2 10*3/uL (ref 4.0–10.5)
WBC: 5.9 10*3/uL (ref 4.0–10.5)
WBC: 8 10*3/uL (ref 4.0–10.5)
WBC: 9 10*3/uL (ref 4.0–10.5)
WBC: 9.2 10*3/uL (ref 4.0–10.5)

## 2011-03-14 LAB — AFB CULTURE WITH SMEAR (NOT AT ARMC): Acid Fast Smear: NONE SEEN

## 2011-03-14 LAB — GLUCOSE, CAPILLARY
Glucose-Capillary: 163 mg/dL — ABNORMAL HIGH (ref 70–99)
Glucose-Capillary: 185 mg/dL — ABNORMAL HIGH (ref 70–99)
Glucose-Capillary: 193 mg/dL — ABNORMAL HIGH (ref 70–99)
Glucose-Capillary: 201 mg/dL — ABNORMAL HIGH (ref 70–99)
Glucose-Capillary: 202 mg/dL — ABNORMAL HIGH (ref 70–99)
Glucose-Capillary: 234 mg/dL — ABNORMAL HIGH (ref 70–99)
Glucose-Capillary: 263 mg/dL — ABNORMAL HIGH (ref 70–99)
Glucose-Capillary: 279 mg/dL — ABNORMAL HIGH (ref 70–99)
Glucose-Capillary: 429 mg/dL — ABNORMAL HIGH (ref 70–99)
Glucose-Capillary: 69 mg/dL — ABNORMAL LOW (ref 70–99)
Glucose-Capillary: 87 mg/dL (ref 70–99)
Glucose-Capillary: 103 mg/dL — ABNORMAL HIGH (ref 70–99)
Glucose-Capillary: 106 mg/dL — ABNORMAL HIGH (ref 70–99)
Glucose-Capillary: 106 mg/dL — ABNORMAL HIGH (ref 70–99)
Glucose-Capillary: 106 mg/dL — ABNORMAL HIGH (ref 70–99)
Glucose-Capillary: 107 mg/dL — ABNORMAL HIGH (ref 70–99)
Glucose-Capillary: 107 mg/dL — ABNORMAL HIGH (ref 70–99)
Glucose-Capillary: 110 mg/dL — ABNORMAL HIGH (ref 70–99)
Glucose-Capillary: 114 mg/dL — ABNORMAL HIGH (ref 70–99)
Glucose-Capillary: 114 mg/dL — ABNORMAL HIGH (ref 70–99)
Glucose-Capillary: 114 mg/dL — ABNORMAL HIGH (ref 70–99)
Glucose-Capillary: 114 mg/dL — ABNORMAL HIGH (ref 70–99)
Glucose-Capillary: 114 mg/dL — ABNORMAL HIGH (ref 70–99)
Glucose-Capillary: 115 mg/dL — ABNORMAL HIGH (ref 70–99)
Glucose-Capillary: 115 mg/dL — ABNORMAL HIGH (ref 70–99)
Glucose-Capillary: 120 mg/dL — ABNORMAL HIGH (ref 70–99)
Glucose-Capillary: 125 mg/dL — ABNORMAL HIGH (ref 70–99)
Glucose-Capillary: 127 mg/dL — ABNORMAL HIGH (ref 70–99)
Glucose-Capillary: 129 mg/dL — ABNORMAL HIGH (ref 70–99)
Glucose-Capillary: 131 mg/dL — ABNORMAL HIGH (ref 70–99)
Glucose-Capillary: 133 mg/dL — ABNORMAL HIGH (ref 70–99)
Glucose-Capillary: 135 mg/dL — ABNORMAL HIGH (ref 70–99)
Glucose-Capillary: 136 mg/dL — ABNORMAL HIGH (ref 70–99)
Glucose-Capillary: 140 mg/dL — ABNORMAL HIGH (ref 70–99)
Glucose-Capillary: 145 mg/dL — ABNORMAL HIGH (ref 70–99)
Glucose-Capillary: 147 mg/dL — ABNORMAL HIGH (ref 70–99)
Glucose-Capillary: 148 mg/dL — ABNORMAL HIGH (ref 70–99)
Glucose-Capillary: 150 mg/dL — ABNORMAL HIGH (ref 70–99)
Glucose-Capillary: 150 mg/dL — ABNORMAL HIGH (ref 70–99)
Glucose-Capillary: 155 mg/dL — ABNORMAL HIGH (ref 70–99)
Glucose-Capillary: 158 mg/dL — ABNORMAL HIGH (ref 70–99)
Glucose-Capillary: 164 mg/dL — ABNORMAL HIGH (ref 70–99)
Glucose-Capillary: 167 mg/dL — ABNORMAL HIGH (ref 70–99)
Glucose-Capillary: 170 mg/dL — ABNORMAL HIGH (ref 70–99)
Glucose-Capillary: 176 mg/dL — ABNORMAL HIGH (ref 70–99)
Glucose-Capillary: 179 mg/dL — ABNORMAL HIGH (ref 70–99)
Glucose-Capillary: 82 mg/dL (ref 70–99)
Glucose-Capillary: 83 mg/dL (ref 70–99)
Glucose-Capillary: 90 mg/dL (ref 70–99)
Glucose-Capillary: 96 mg/dL (ref 70–99)
Glucose-Capillary: 99 mg/dL (ref 70–99)
Glucose-Capillary: 99 mg/dL (ref 70–99)
Glucose-Capillary: 124 mg/dL — ABNORMAL HIGH (ref 70–99)

## 2011-03-14 LAB — CULTURE, ROUTINE-ABSCESS: Culture: NO GROWTH

## 2011-03-14 LAB — DIFFERENTIAL
Basophils Absolute: 0 10*3/uL (ref 0.0–0.1)
Basophils Absolute: 0 10*3/uL (ref 0.0–0.1)
Basophils Relative: 0 % (ref 0–1)
Basophils Relative: 0 % (ref 0–1)
Eosinophils Absolute: 0.1 10*3/uL (ref 0.0–0.7)
Eosinophils Absolute: 0.1 10*3/uL (ref 0.0–0.7)
Eosinophils Relative: 0 % (ref 0–5)
Eosinophils Relative: 0 % (ref 0–5)
Eosinophils Relative: 1 % (ref 0–5)
Eosinophils Relative: 2 % (ref 0–5)
Lymphocytes Relative: 6 % — ABNORMAL LOW (ref 12–46)
Lymphocytes Relative: 12 % (ref 12–46)
Lymphocytes Relative: 9 % — ABNORMAL LOW (ref 12–46)
Lymphs Abs: 0.8 10*3/uL (ref 0.7–4.0)
Lymphs Abs: 1.2 10*3/uL (ref 0.7–4.0)
Lymphs Abs: 1 10*3/uL (ref 0.7–4.0)
Lymphs Abs: 1.1 10*3/uL (ref 0.7–4.0)
Lymphs Abs: 1.6 10*3/uL (ref 0.7–4.0)
Monocytes Absolute: 0.3 10*3/uL (ref 0.1–1.0)
Monocytes Absolute: 0.5 10*3/uL (ref 0.1–1.0)
Monocytes Absolute: 0.5 10*3/uL (ref 0.1–1.0)
Monocytes Relative: 5 % (ref 3–12)
Monocytes Relative: 5 % (ref 3–12)
Monocytes Relative: 7 % (ref 3–12)
Neutro Abs: 13.5 10*3/uL — ABNORMAL HIGH (ref 1.7–7.7)
Neutro Abs: 7.2 10*3/uL (ref 1.7–7.7)
Neutro Abs: 5.4 10*3/uL (ref 1.7–7.7)
Neutro Abs: 8.1 10*3/uL — ABNORMAL HIGH (ref 1.7–7.7)
Neutrophils Relative %: 82 % — ABNORMAL HIGH (ref 43–77)
Neutrophils Relative %: 70 % (ref 43–77)
Neutrophils Relative %: 78 % — ABNORMAL HIGH (ref 43–77)
Neutrophils Relative %: 85 % — ABNORMAL HIGH (ref 43–77)

## 2011-03-14 LAB — URINE CULTURE
Colony Count: >=100000
Colony Count: NO GROWTH

## 2011-03-14 LAB — PHOSPHORUS: Phosphorus: 1.9 mg/dL — ABNORMAL LOW (ref 2.3–4.6)

## 2011-03-14 LAB — URINALYSIS, ROUTINE W REFLEX MICROSCOPIC
Nitrite: NEGATIVE
Protein, ur: NEGATIVE mg/dL
Specific Gravity, Urine: 1.018 (ref 1.005–1.030)
Urobilinogen, UA: 0.2 mg/dL (ref 0.0–1.0)

## 2011-03-14 LAB — HEPATIC FUNCTION PANEL
ALT: 27 U/L (ref 0–35)
AST: 26 U/L (ref 0–37)
Alkaline Phosphatase: 53 U/L (ref 39–117)
Total Protein: 5.8 g/dL — ABNORMAL LOW (ref 6.0–8.3)

## 2011-03-14 LAB — FUNGUS CULTURE W SMEAR

## 2011-03-14 LAB — PROTIME-INR
INR: 1.02 (ref 0.00–1.49)
Prothrombin Time: 13.2 seconds (ref 11.6–15.2)

## 2011-03-14 LAB — MAGNESIUM
Magnesium: 2.1 mg/dL (ref 1.5–2.5)
Magnesium: 2 mg/dL (ref 1.5–2.5)

## 2011-03-14 LAB — LIPID PANEL
LDL Cholesterol: 69 mg/dL (ref 0–99)
Triglycerides: 192 mg/dL — ABNORMAL HIGH (ref <150)

## 2011-03-14 LAB — C-REACTIVE PROTEIN: CRP: 0.6 mg/dL — ABNORMAL HIGH (ref <0.6)

## 2011-03-14 LAB — LACTIC ACID, PLASMA: Lactic Acid, Venous: 2.7 mmol/L — ABNORMAL HIGH (ref 0.5–2.2)

## 2011-03-14 LAB — DIC (DISSEMINATED INTRAVASCULAR COAGULATION)PANEL
INR: 1 (ref 0.00–1.49)
Platelets: 73 10*3/uL — ABNORMAL LOW (ref 150–400)
Smear Review: NONE SEEN

## 2011-03-14 LAB — HEMOGLOBIN A1C
Hgb A1c MFr Bld: 9.8 % — ABNORMAL HIGH (ref 4.6–6.1)
Hgb A1c MFr Bld: 10.5 % — ABNORMAL HIGH (ref 4.6–6.1)
Mean Plasma Glucose: 255 mg/dL

## 2011-03-14 LAB — URINALYSIS, MICROSCOPIC ONLY
Bilirubin Urine: NEGATIVE
Ketones, ur: NEGATIVE mg/dL
Nitrite: NEGATIVE
Specific Gravity, Urine: 1.016 (ref 1.005–1.030)
Urobilinogen, UA: 0.2 mg/dL (ref 0.0–1.0)

## 2011-03-14 LAB — SODIUM, URINE, RANDOM: Sodium, Ur: 60 mEq/L

## 2011-03-14 LAB — POCT CARDIAC MARKERS
CKMB, poc: 2.7 ng/mL (ref 1.0–8.0)
Myoglobin, poc: >500 ng/mL (ref 12–200)
Troponin i, poc: <0.05 ng/mL (ref 0.00–0.09)

## 2011-03-14 LAB — HIV ANTIBODY (ROUTINE TESTING W REFLEX)
HIV: NONREACTIVE
HIV: NONREACTIVE

## 2011-03-14 LAB — CARBAMAZEPINE LEVEL, TOTAL: Carbamazepine Lvl: 15.3 ug/mL (ref 4.0–12.0)

## 2011-03-14 LAB — VANCOMYCIN, TROUGH: Vancomycin Tr: 21.9 ug/mL — ABNORMAL HIGH (ref 10.0–20.0)

## 2011-03-14 LAB — URINE MICROSCOPIC-ADD ON

## 2011-03-14 LAB — WOUND CULTURE
Culture: NO GROWTH
Gram Stain: NONE SEEN

## 2011-03-14 LAB — CREATININE, URINE, RANDOM: Creatinine, Urine: 27 mg/dL

## 2011-03-15 LAB — BASIC METABOLIC PANEL
Chloride: 109 mEq/L (ref 96–112)
GFR calc non Af Amer: 41 mL/min — ABNORMAL LOW (ref >60)
Potassium: 3.8 mEq/L (ref 3.5–5.1)
Sodium: 144 mEq/L (ref 135–145)

## 2011-03-15 LAB — APTT: aPTT: 70 seconds — ABNORMAL HIGH (ref 24–37)

## 2011-03-15 LAB — POCT HEMOGLOBIN-HEMACUE: Hemoglobin: 13.8 g/dL (ref 12.0–15.0)

## 2011-03-15 LAB — PROTIME-INR
INR: 2.44 — ABNORMAL HIGH (ref 0.00–1.49)
Prothrombin Time: 26.3 seconds — ABNORMAL HIGH (ref 11.6–15.2)

## 2011-03-15 LAB — GLUCOSE, CAPILLARY: Glucose-Capillary: 125 mg/dL — ABNORMAL HIGH (ref 70–99)

## 2011-03-17 LAB — GLUCOSE, CAPILLARY
Glucose-Capillary: 104 mg/dL — ABNORMAL HIGH (ref 70–99)
Glucose-Capillary: 105 mg/dL — ABNORMAL HIGH (ref 70–99)
Glucose-Capillary: 106 mg/dL — ABNORMAL HIGH (ref 70–99)
Glucose-Capillary: 107 mg/dL — ABNORMAL HIGH (ref 70–99)
Glucose-Capillary: 107 mg/dL — ABNORMAL HIGH (ref 70–99)
Glucose-Capillary: 109 mg/dL — ABNORMAL HIGH (ref 70–99)
Glucose-Capillary: 110 mg/dL — ABNORMAL HIGH (ref 70–99)
Glucose-Capillary: 111 mg/dL — ABNORMAL HIGH (ref 70–99)
Glucose-Capillary: 112 mg/dL — ABNORMAL HIGH (ref 70–99)
Glucose-Capillary: 117 mg/dL — ABNORMAL HIGH (ref 70–99)
Glucose-Capillary: 117 mg/dL — ABNORMAL HIGH (ref 70–99)
Glucose-Capillary: 118 mg/dL — ABNORMAL HIGH (ref 70–99)
Glucose-Capillary: 121 mg/dL — ABNORMAL HIGH (ref 70–99)
Glucose-Capillary: 124 mg/dL — ABNORMAL HIGH (ref 70–99)
Glucose-Capillary: 127 mg/dL — ABNORMAL HIGH (ref 70–99)
Glucose-Capillary: 130 mg/dL — ABNORMAL HIGH (ref 70–99)
Glucose-Capillary: 137 mg/dL — ABNORMAL HIGH (ref 70–99)
Glucose-Capillary: 170 mg/dL — ABNORMAL HIGH (ref 70–99)
Glucose-Capillary: 177 mg/dL — ABNORMAL HIGH (ref 70–99)
Glucose-Capillary: 89 mg/dL (ref 70–99)
Glucose-Capillary: 90 mg/dL (ref 70–99)

## 2011-03-17 LAB — CBC
HCT: 23.8 % — ABNORMAL LOW (ref 36.0–46.0)
HCT: 24.1 % — ABNORMAL LOW (ref 36.0–46.0)
HCT: 24.2 % — ABNORMAL LOW (ref 36.0–46.0)
HCT: 24.3 % — ABNORMAL LOW (ref 36.0–46.0)
HCT: 24.7 % — ABNORMAL LOW (ref 36.0–46.0)
HCT: 25.1 % — ABNORMAL LOW (ref 36.0–46.0)
Hemoglobin: 8.2 g/dL — ABNORMAL LOW (ref 12.0–15.0)
Hemoglobin: 8.3 g/dL — ABNORMAL LOW (ref 12.0–15.0)
Hemoglobin: 8.4 g/dL — ABNORMAL LOW (ref 12.0–15.0)
Hemoglobin: 8.4 g/dL — ABNORMAL LOW (ref 12.0–15.0)
Hemoglobin: 8.5 g/dL — ABNORMAL LOW (ref 12.0–15.0)
Hemoglobin: 8.6 g/dL — ABNORMAL LOW (ref 12.0–15.0)
MCHC: 33.8 g/dL (ref 30.0–36.0)
MCHC: 34 g/dL (ref 30.0–36.0)
MCHC: 34.4 g/dL (ref 30.0–36.0)
MCHC: 34.5 g/dL (ref 30.0–36.0)
MCHC: 34.8 g/dL (ref 30.0–36.0)
MCHC: 34.9 g/dL (ref 30.0–36.0)
MCV: 100.1 fL — ABNORMAL HIGH (ref 78.0–100.0)
MCV: 100.2 fL — ABNORMAL HIGH (ref 78.0–100.0)
MCV: 101.7 fL — ABNORMAL HIGH (ref 78.0–100.0)
MCV: 102.4 fL — ABNORMAL HIGH (ref 78.0–100.0)
Platelets: 214 10*3/uL (ref 150–400)
Platelets: 217 10*3/uL (ref 150–400)
Platelets: 225 10*3/uL (ref 150–400)
Platelets: 248 10*3/uL (ref 150–400)
Platelets: 265 10*3/uL (ref 150–400)
RBC: 2.39 MIL/uL — ABNORMAL LOW (ref 3.87–5.11)
RBC: 2.39 MIL/uL — ABNORMAL LOW (ref 3.87–5.11)
RBC: 2.45 MIL/uL — ABNORMAL LOW (ref 3.87–5.11)
RBC: 2.51 MIL/uL — ABNORMAL LOW (ref 3.87–5.11)
RDW: 22.2 % — ABNORMAL HIGH (ref 11.5–15.5)
RDW: 23.3 % — ABNORMAL HIGH (ref 11.5–15.5)
RDW: 24 % — ABNORMAL HIGH (ref 11.5–15.5)
RDW: 24.3 % — ABNORMAL HIGH (ref 11.5–15.5)
RDW: 24.5 % — ABNORMAL HIGH (ref 11.5–15.5)
RDW: 24.7 % — ABNORMAL HIGH (ref 11.5–15.5)
RDW: 24.8 % — ABNORMAL HIGH (ref 11.5–15.5)
WBC: 5.2 10*3/uL (ref 4.0–10.5)
WBC: 5.2 10*3/uL (ref 4.0–10.5)
WBC: 5.4 10*3/uL (ref 4.0–10.5)
WBC: 6.1 10*3/uL (ref 4.0–10.5)
WBC: 6.6 10*3/uL (ref 4.0–10.5)

## 2011-03-17 LAB — BASIC METABOLIC PANEL
BUN: 10 mg/dL (ref 6–23)
BUN: 10 mg/dL (ref 6–23)
BUN: 11 mg/dL (ref 6–23)
BUN: 12 mg/dL (ref 6–23)
BUN: 12 mg/dL (ref 6–23)
BUN: 12 mg/dL (ref 6–23)
CO2: 20 mEq/L (ref 19–32)
CO2: 22 mEq/L (ref 19–32)
CO2: 23 mEq/L (ref 19–32)
CO2: 23 mEq/L (ref 19–32)
CO2: 23 mEq/L (ref 19–32)
Calcium: 8.1 mg/dL — ABNORMAL LOW (ref 8.4–10.5)
Calcium: 8.2 mg/dL — ABNORMAL LOW (ref 8.4–10.5)
Calcium: 8.2 mg/dL — ABNORMAL LOW (ref 8.4–10.5)
Calcium: 8.4 mg/dL (ref 8.4–10.5)
Calcium: 8.6 mg/dL (ref 8.4–10.5)
Calcium: 8.7 mg/dL (ref 8.4–10.5)
Chloride: 113 mEq/L — ABNORMAL HIGH (ref 96–112)
Chloride: 114 mEq/L — ABNORMAL HIGH (ref 96–112)
Chloride: 117 mEq/L — ABNORMAL HIGH (ref 96–112)
Creatinine, Ser: 1.01 mg/dL (ref 0.4–1.2)
Creatinine, Ser: 1.04 mg/dL (ref 0.4–1.2)
Creatinine, Ser: 1.07 mg/dL (ref 0.4–1.2)
Creatinine, Ser: 1.11 mg/dL (ref 0.4–1.2)
Creatinine, Ser: 1.17 mg/dL (ref 0.4–1.2)
Creatinine, Ser: 1.18 mg/dL (ref 0.4–1.2)
GFR calc Af Amer: 59 mL/min — ABNORMAL LOW (ref >60)
GFR calc Af Amer: 60 mL/min — ABNORMAL LOW (ref >60)
GFR calc Af Amer: >60 mL/min (ref >60)
GFR calc Af Amer: >60 mL/min (ref >60)
GFR calc Af Amer: >60 mL/min (ref >60)
GFR calc non Af Amer: 48 mL/min — ABNORMAL LOW (ref >60)
GFR calc non Af Amer: 49 mL/min — ABNORMAL LOW (ref >60)
GFR calc non Af Amer: 51 mL/min — ABNORMAL LOW (ref >60)
GFR calc non Af Amer: 51 mL/min — ABNORMAL LOW (ref >60)
GFR calc non Af Amer: 52 mL/min — ABNORMAL LOW (ref >60)
GFR calc non Af Amer: 54 mL/min — ABNORMAL LOW (ref >60)
Glucose, Bld: 101 mg/dL — ABNORMAL HIGH (ref 70–99)
Glucose, Bld: 101 mg/dL — ABNORMAL HIGH (ref 70–99)
Glucose, Bld: 106 mg/dL — ABNORMAL HIGH (ref 70–99)
Glucose, Bld: 108 mg/dL — ABNORMAL HIGH (ref 70–99)
Glucose, Bld: 108 mg/dL — ABNORMAL HIGH (ref 70–99)
Glucose, Bld: 98 mg/dL (ref 70–99)
Glucose, Bld: 99 mg/dL (ref 70–99)
Potassium: 3.9 mEq/L (ref 3.5–5.1)
Potassium: 3.9 mEq/L (ref 3.5–5.1)
Potassium: 4 mEq/L (ref 3.5–5.1)
Potassium: 4.1 mEq/L (ref 3.5–5.1)
Potassium: 4.2 mEq/L (ref 3.5–5.1)
Sodium: 139 mEq/L (ref 135–145)
Sodium: 142 mEq/L (ref 135–145)
Sodium: 142 mEq/L (ref 135–145)
Sodium: 142 mEq/L (ref 135–145)
Sodium: 145 mEq/L (ref 135–145)
Sodium: 145 mEq/L (ref 135–145)

## 2011-03-17 LAB — PROTIME-INR
INR: 2.02 — ABNORMAL HIGH (ref 0.00–1.49)
INR: 2.11 — ABNORMAL HIGH (ref 0.00–1.49)
INR: 3.17 — ABNORMAL HIGH (ref 0.00–1.49)
INR: 4.74 — ABNORMAL HIGH (ref 0.00–1.49)
Prothrombin Time: 18 seconds — ABNORMAL HIGH (ref 11.6–15.2)
Prothrombin Time: 36 seconds — ABNORMAL HIGH (ref 11.6–15.2)

## 2011-03-17 LAB — VANCOMYCIN, TROUGH
Vancomycin Tr: 19.7 ug/mL (ref 10.0–20.0)
Vancomycin Tr: 24.2 ug/mL — ABNORMAL HIGH (ref 10.0–20.0)

## 2011-04-03 LAB — BASIC METABOLIC PANEL
BUN: 20 mg/dL (ref 6–23)
CO2: 26 mEq/L (ref 19–32)
Chloride: 107 mEq/L (ref 96–112)
Creatinine, Ser: 1.14 mg/dL (ref 0.4–1.2)
Glucose, Bld: 94 mg/dL (ref 70–99)
Potassium: 3.9 mEq/L (ref 3.5–5.1)

## 2011-04-03 LAB — CBC
HCT: 42.5 % (ref 36.0–46.0)
MCHC: 34.3 g/dL (ref 30.0–36.0)
MCV: 96.6 fL (ref 78.0–100.0)
Platelets: 254 10*3/uL (ref 150–400)
WBC: 8.2 10*3/uL (ref 4.0–10.5)

## 2011-04-05 IMAGING — US US RENAL
1 series · 1 of 1 positions shown · non-contrast
Comparison: None

CLINICAL DATA: Elevated BUN and creatinine.

RENAL/URINARY TRACT ULTRASOUND COMPLETE

[Series 1: us renal · 0.30mm/px · 1 of 1 slices shown]
[im 1/1]
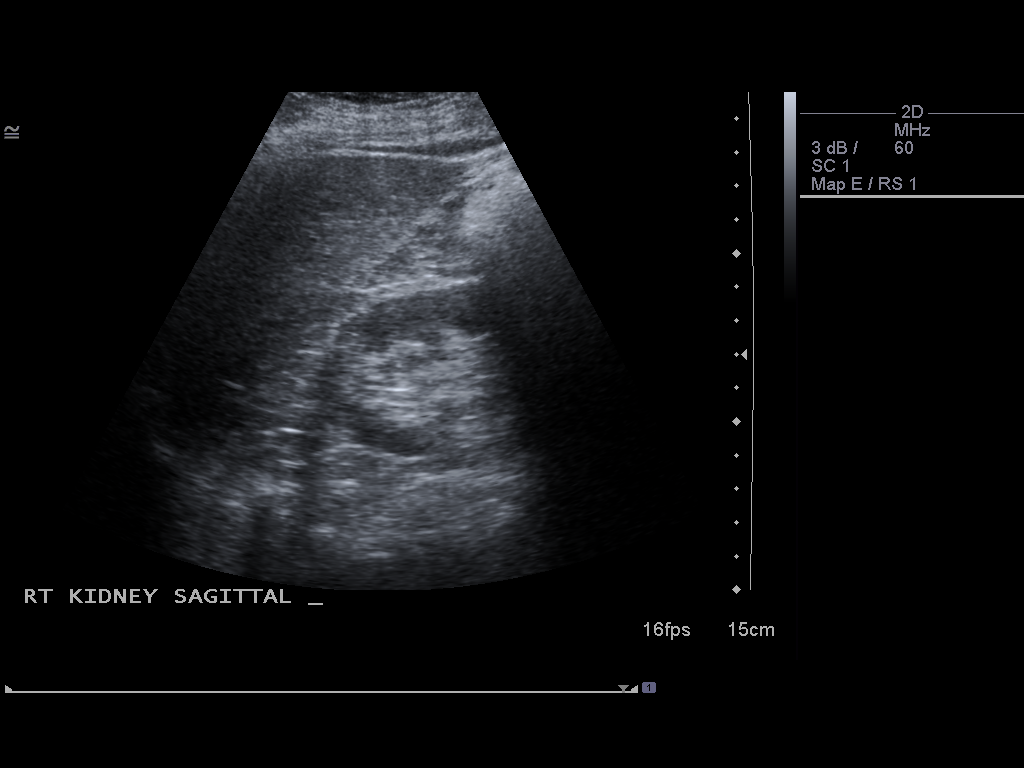

[1 of 1 positions shown; findings below may reference images not displayed]

FINDINGS: Right Kidney:  10.79 cm in length.  Normal renal cortical thickness
and echogenicity without hydronephrosis.  There is a 1.6 cm lower
pole renal calculus.

Left Kidney:  9.4 cm in length.  Normal renal cortical thickness
and echogenicity without hydronephrosis.  There is a 1.2 cm lower
pole renal calculus and a 8 mm lower pole cyst.

Bladder:  Layering debris is noted the bladder which is well
distended.
IMPRESSION: 1.  Bilateral renal calculi.
2.  No hydronephrosis.
3.  Layering debris in the bladder may be due to hemorrhage or
infection.

## 2011-04-06 IMAGING — XA IR FLUORO GUIDE SPINAL/SI JT INJ*L*
1 series · 4 of 4 positions shown · non-contrast
Comparison: MRI scan of the lumbosacral spine of 01/10/2010.

CLINICAL DATA: Severe low back pain.  Abnormal MRI of the
lumbosacral spine at L3-L4 suggestive of discitis/osteomyelitis.

SPINAL INJECTION UNDER FLUOROSCOPY (LEFT)

[Series 300: sp us guide bx asp/drain · 4 of 4 slices shown]
[im 1/4]
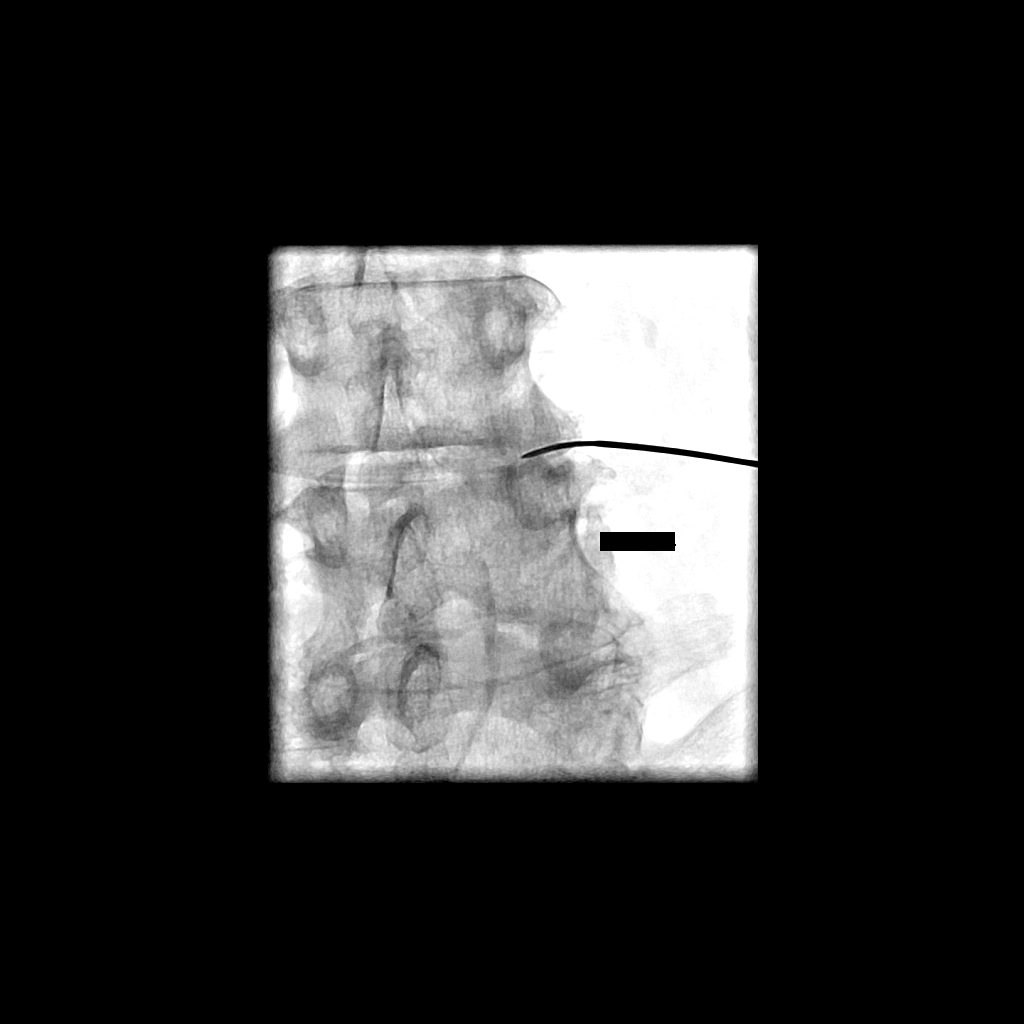
[im 2/4]
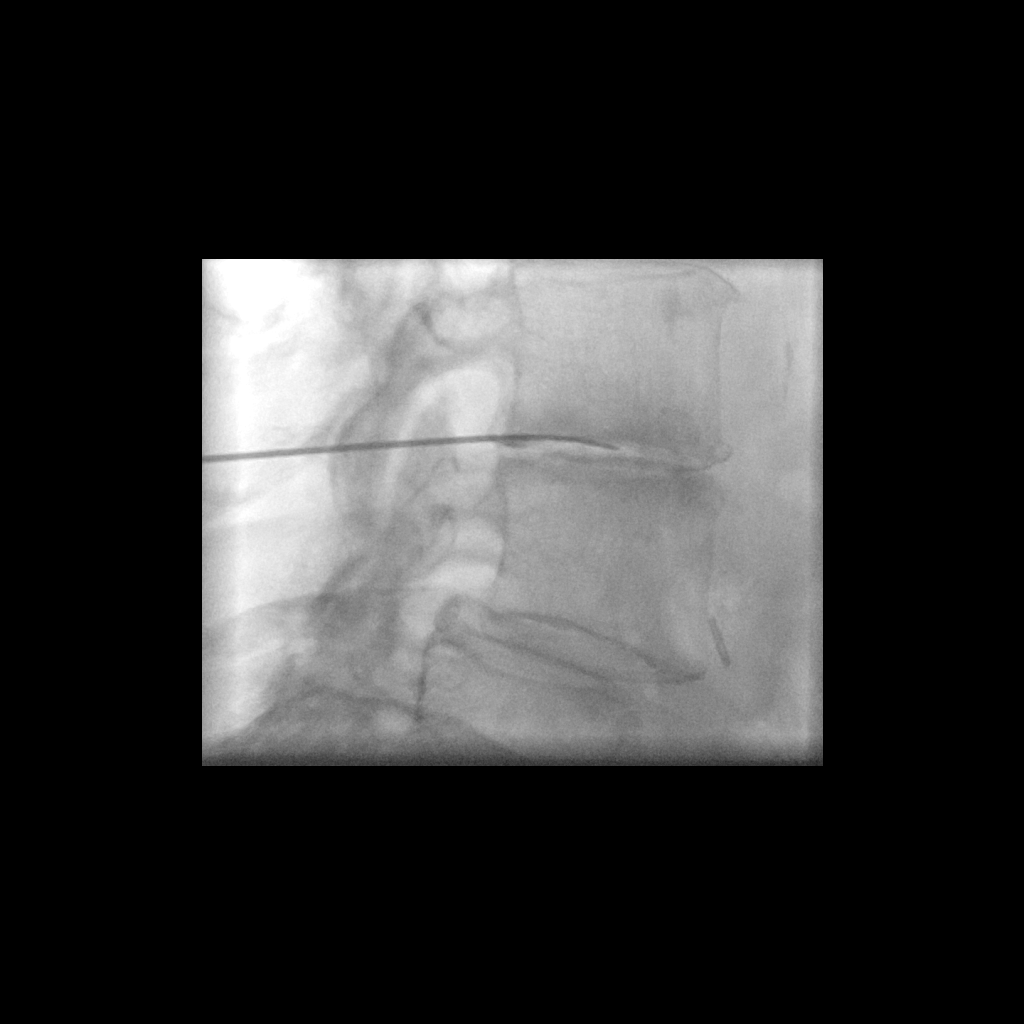
[im 3/4]
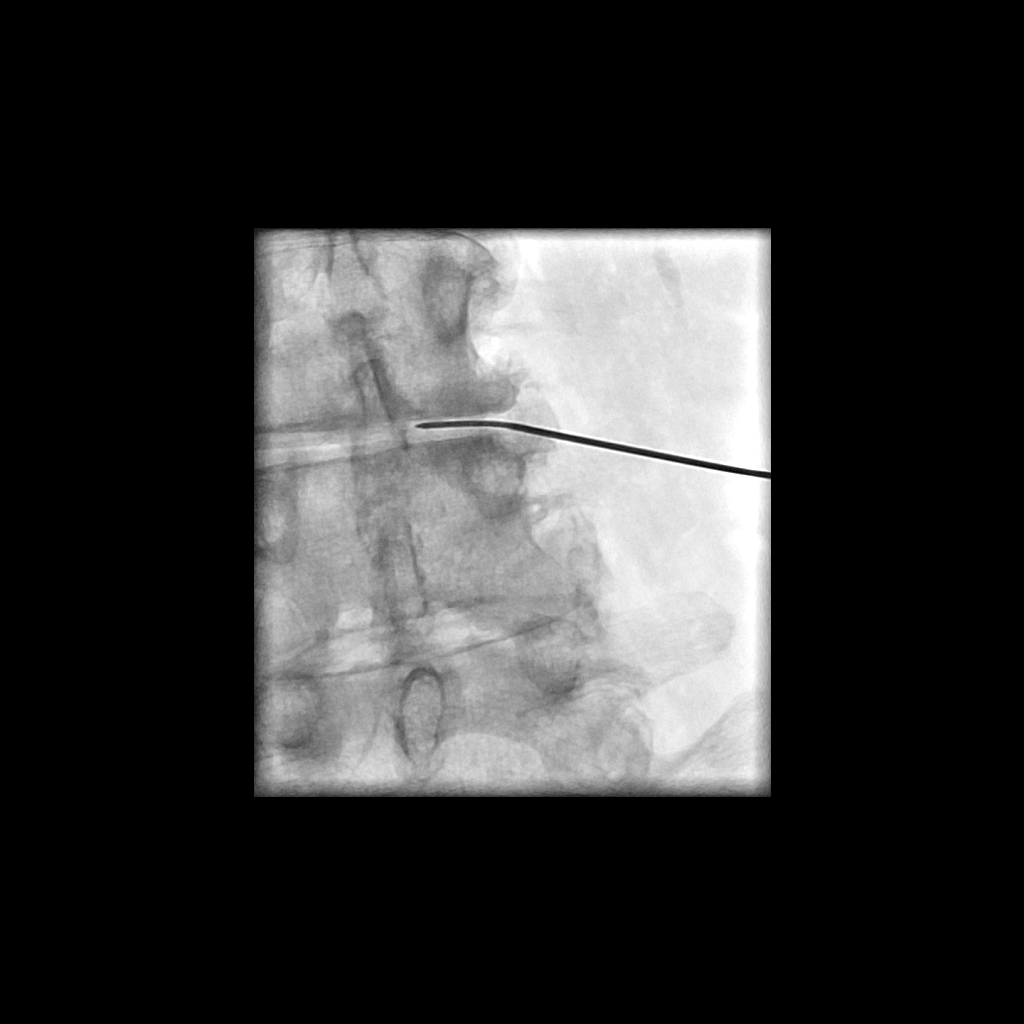
[im 4/4]
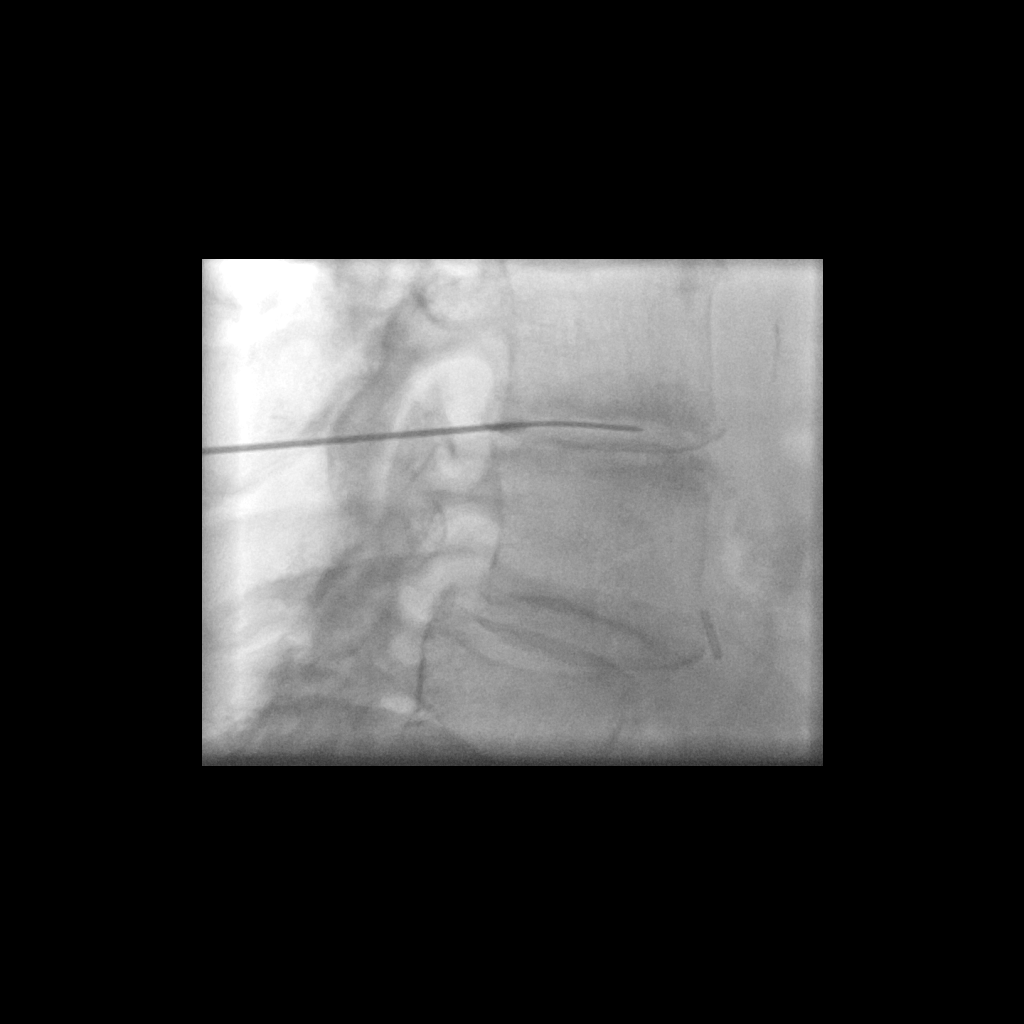

[4 of 4 positions shown; findings below may reference images not displayed]

Following a full explanation of the procedure along with the
potential associated complications, an informed witnessed consent
obtained.

The patient was placed prone on the fluoroscopic table.  The skin
overlying the lumbosacral region was then prepped and draped in the
usual sterile fashion.

The skin entry site at L3-L4 was then infiltrated with 0.25%
bupivacaine and extended into the right paraspinous soft tissues.

Using biplane intermittent fluoroscopy, an 18-gauge cannula/a 20-
gauge Rimbertas coaxial biopsy system, three passes were made using
a 20-gauge Rimbertas needle into the L3-L4 disc space.  A total of
approximately 2.5 ml of bloody stained fluid was aspirated and sent
for microbiological analysis.

The combination was then removed.  Hemostasis was achieved at the
skin entry site.

The patient tolerated the procedure well.

Medications utilized: Versed 2 mg IV.  Fentanyl 50 mcg IV.
IMPRESSION: 1.  Status post fluoroscopic-guided needle placement for aspiration
of the L3-L4 disc space via the right posterolateral paraspinal
approach, for suspected discitis .

## 2011-04-07 IMAGING — CT CT HEAD W/O CM
1 of 2 series · 13 of 30 positions shown, 17 images · non-contrast
Comparison: Head CT 01/08/2010.

CLINICAL DATA: Altered level of consciousness and hyperglycemia.
Acute shingles.

CT HEAD WITHOUT CONTRAST
TECHNIQUE: Contiguous axial images were obtained from the base of
the skull through the vertex without contrast.

[Series 2: brain · axial · 0.47mm/px · z∈[-90,+42]mm · 13 of 28 slices shown, 17 images]
[im 2/28  brain]
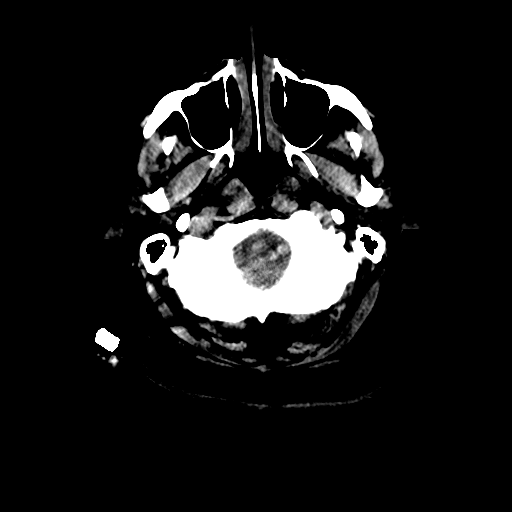
[im 2/28  bone]
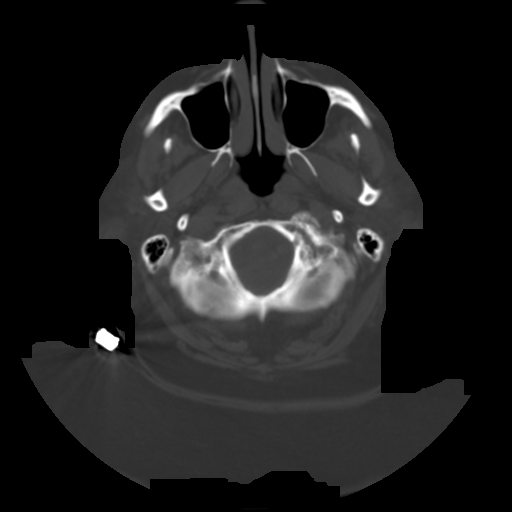
[im 4/28  brain]
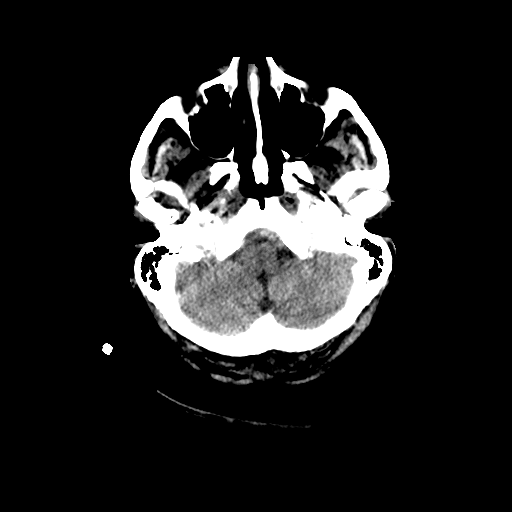
[im 6/28  brain]
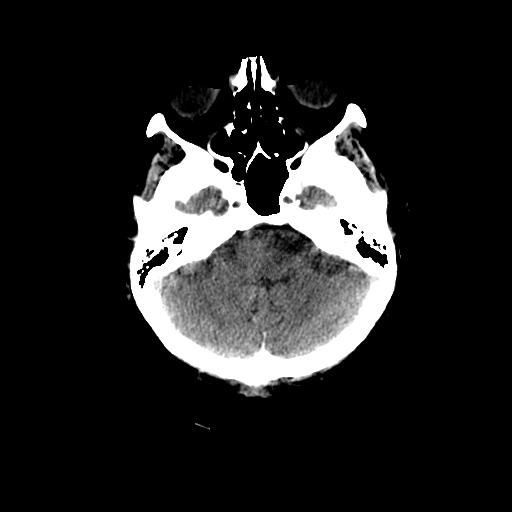
[im 8/28  brain]
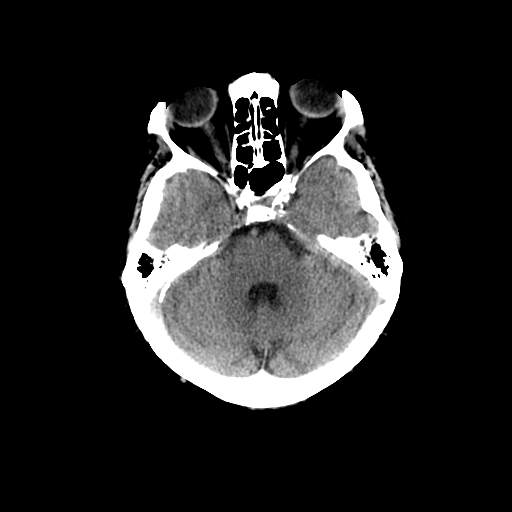
[im 10/28  brain]
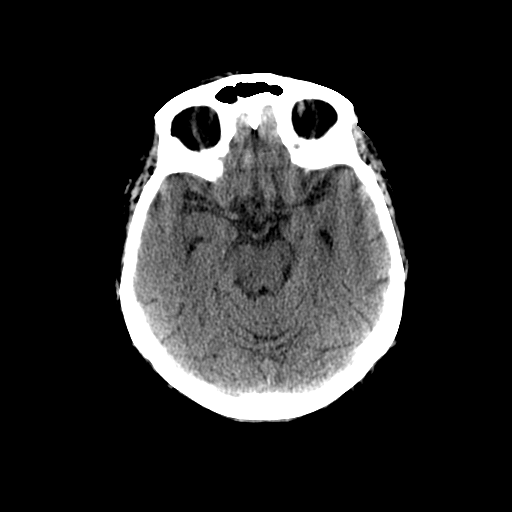
[im 10/28  bone]
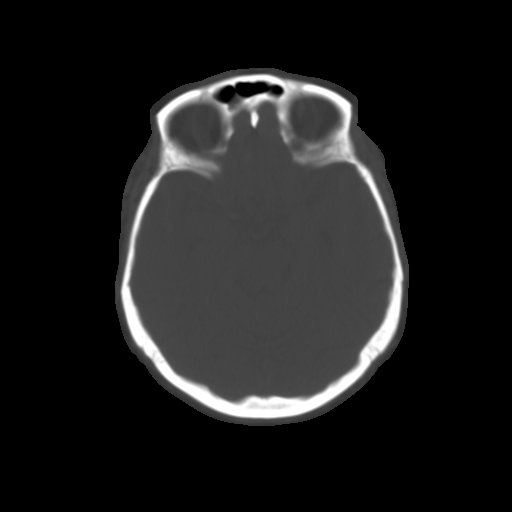
[im 12/28  brain]
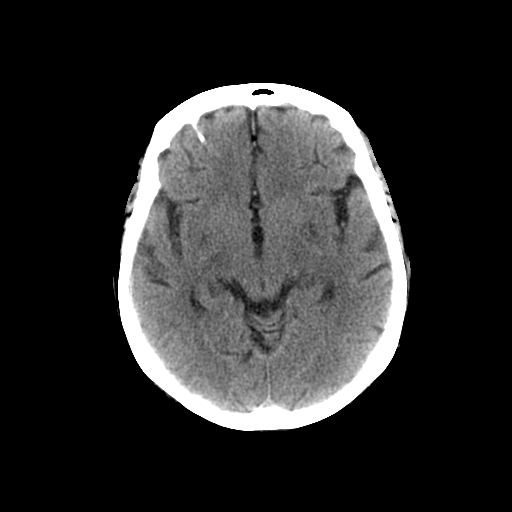
[im 14/28  brain]
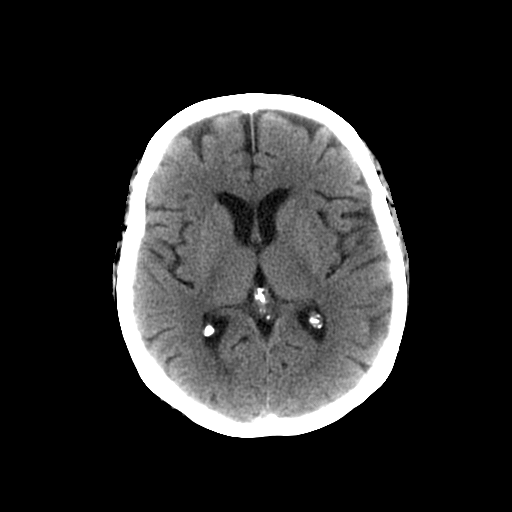
[im 16/28  brain]
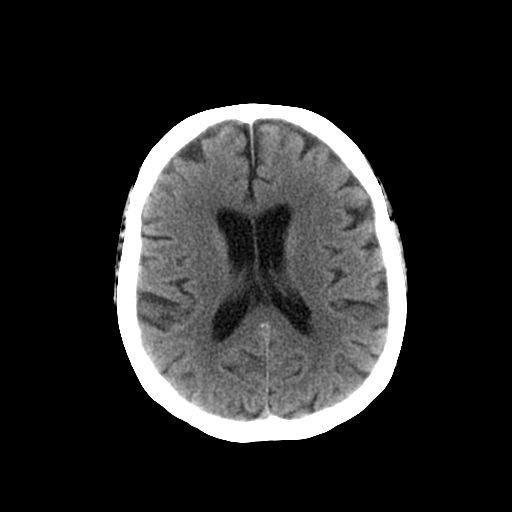
[im 18/28  brain]
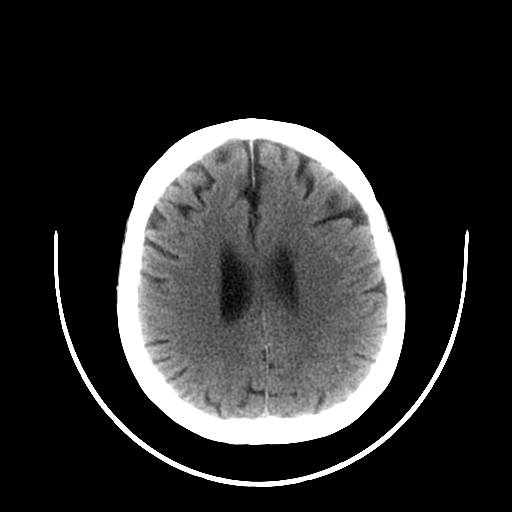
[im 18/28  bone]
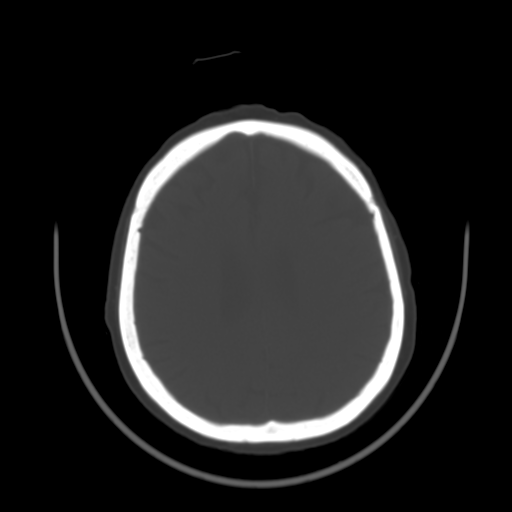
[im 20/28  brain]
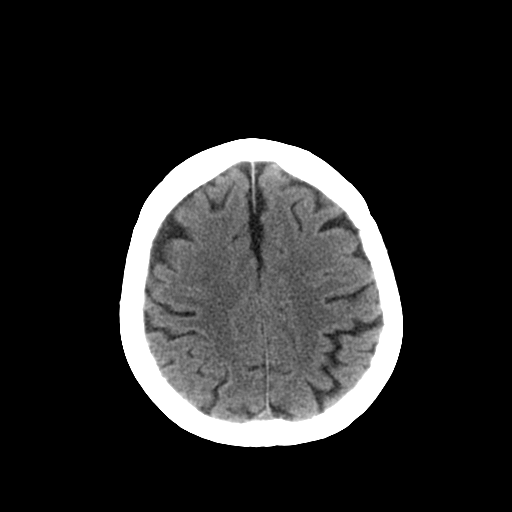
[im 22/28  brain]
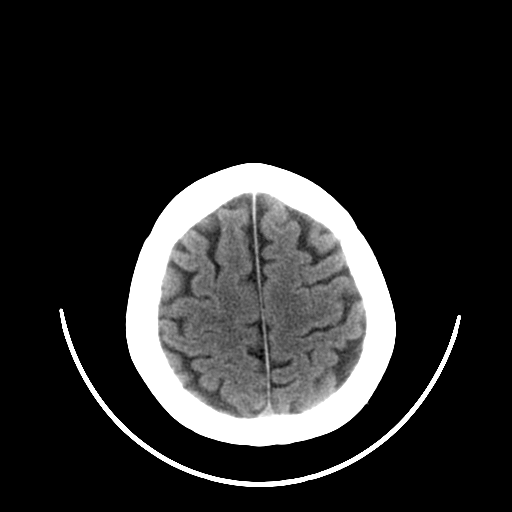
[im 24/28  brain]
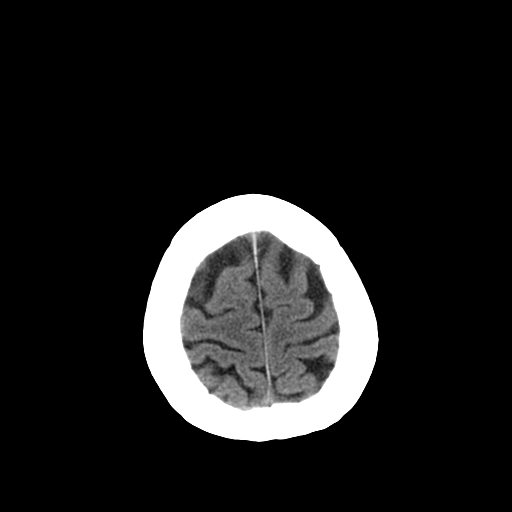
[im 26/28  brain]
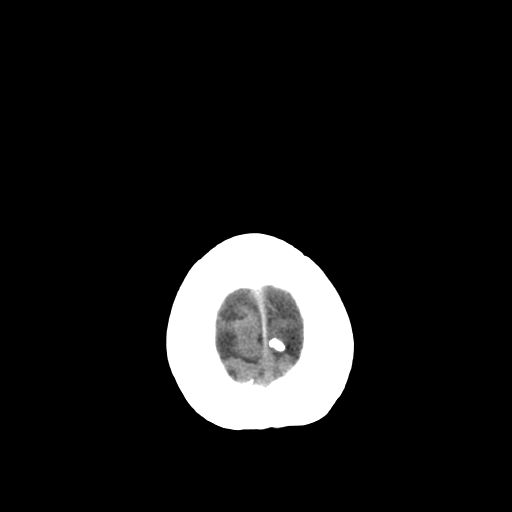
[im 26/28  bone]
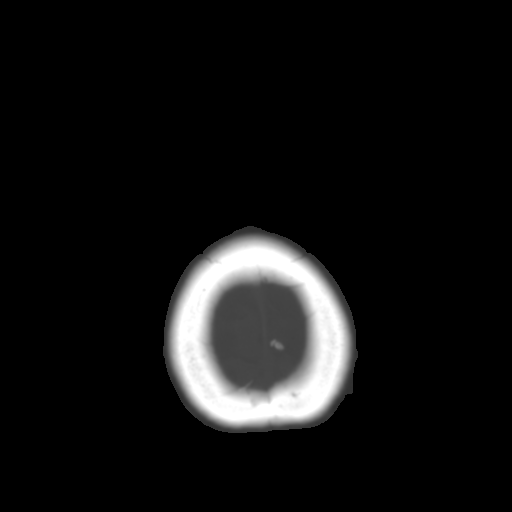

[13 of 30 positions shown; findings below may reference images not displayed]

FINDINGS: There is no evidence of acute intracranial hemorrhage,
mass lesion, brain edema or extra-axial fluid collection.  The
ventricles and subarachnoid spaces are appropriately sized for age.
There is no CT evidence of acute cortical infarction.

The visualized paranasal sinuses are clear. The calvarium is
intact.
IMPRESSION: Stable examination.  No acute intracranial findings.

## 2011-05-11 NOTE — Op Note (Signed)
Julia, [name]               ACCOUNT NO.:  [id number]   MEDICAL RECORD NO.:  [id number]          PATIENT TYPE:  OIB   LOCATION:  3537                         FACILITY:  [facility]   PHYSICIAN:  Stefani Dama, M.D.  DATE OF BIRTH:  [birth year]   DATE OF PROCEDURE:  08/21/2009  DATE OF DISCHARGE:                               OPERATIVE REPORT   PREOPERATIVE DIAGNOSES:  1. Cervical spondylosis with radiculopathy and myelopathy, C3-4.  2. Pseudoarthrosis, C5-C6.  3. Status post arthrodesis, C4 through C7.   POSTOPERATIVE DIAGNOSES:  1. Cervical spondylosis with radiculopathy and myelopathy, C3-4.  2. Pseudoarthrosis, C5-C6.  3. Status post arthrodesis, C4 through C7.   OPERATION:  1. Anterior cervical decompression, C3-C4 arthrodesis with structural      allograft and Alphatec plate fixation, C3-C4.  2. Removal of hardware from C5 through C7, evaluation of arthrodesis      C5-6 with note of pseudoarthrosis.  3. Decompression, C5-C6 and arthrodesis with structural allograft and      Synthes plate fixation, C5-C6.   SURGEON:  Stefani Dama, MD   FIRST ASSISTANT:  [name], MD   ANESTHESIA:  General endotracheal.   INDICATIONS:  [name] is a 75 year old individual, who has had  significant problems with neck, shoulder, and arm pain.  She has chronic  neck pain, possibly secondary to the pseudoarthrosis, which is suspected  at C5-6 level where she has a broken previously placed anterior plate  from C5 through C7.  She also has cord compression at the level of C3-4,  and she has been advised regarding surgical decompression and  stabilization of this procedure.   The patient was brought to the operating room, placed on the table in  supine position.  After smooth induction of general endotracheal  anesthesia, her head was placed in 5 pounds Holter traction.  Neck was  prepped with alcohol and DuraPrep and draped in sterile fashion.  A  transverse incision was made  in the upper portion.  Previously made  cervical incision was dissected down through the platysma.  Plane  between sternocleidomastoid and strap muscle was then dissected bluntly  until the prevertebral space was reached.  There was noted to be a  significant amount of scar in this dissection from her previous surgery.  The C3-4 disk space was identified with radiographic confirmation.  Then, the ventral aspects of the disk space were opened carefully  separating the longus coli muscle off either side of midline.  The disk  space was evacuated with significant quantity of severely degenerated  and desiccated disk material, the region of posterior longitudinal  ligament was reached.  There was noted to be substantial overgrowth and  redundancy in the ligament posteriorly.  This was causing considerable  spinal cord compression, and was carefully dissected and drilled away  removing large osteophytes from the inferior margin of body of C3,  superior margin of body of C4.  Once this area was decompressed, lateral  recesses were decompressed, nerve roots were decompressed, and the area  was checked for hemostasis.  A 6-mm transgraft was shaved in  size to  appropriate configuration and placed in the interspace.  A 14-mm  standard size Alphatec plate was then fixed to the ventral aspect of the  vertebral bodies with 4 variable angled 14-mm screws.   Attention was then turned to her previously placed plate and by  dissecting over it, the superior screws were removed.  The upper portion  of plate was removed.  The area at C5-C6 was then carefully inspected,  and was noted to be a pseudoarthrosis with some very modest ever-present  gross motion at the level of disk space.  Inferior portion of plate was  then removed carefully drilling out the screw heads, which were imbedded  into the inferior portion of the plate.  Once this was removed, the area  was inspected more carefully at the C5-6 space.   A 2.3-mm dissecting  tool was then used to drill out the area of the pseudoarthrosis and  created the interspace at C5-C6 level at the region of posterior  ligament.  This was carefully dissected.  There was some adherence to  the ventral aspect of the dura.  In any event, once the decompression  was performed, a 6-mm standard size transgraft was fitted with  demineralized bone matrix and tamped carefully into the interspace.  At  this point, a 16-mm Synthes plate was fixed at a ventral aspect of the  vertebral bodies with locking 14-mm screws.  With this being placed, the  area was checked for hemostasis.  Final radiographic verification of the  surgical procedure was obtained, and then the platysma was closed with 3-  0 Vicryl in interrupted fashion, 3-0 Vicryl was used in the subcuticular  tissues, and Dermabond was used to close the skin.  The patient  tolerated the procedure well and was returned to the recovery room in  stable condition.      Stefani Dama, M.D.  Electronically Signed     HJE/MEDQ  D:  08/21/2009  T:  08/22/2009  Job:  [id number]

## 2011-05-11 NOTE — Discharge Summary (Signed)
[name], [name]               ACCOUNT NO.:  [id number]   MEDICAL RECORD NO.:  [id number]          PATIENT TYPE:  INP   LOCATION:  6703                         FACILITY:  [facility]   PHYSICIAN:  Hillery Aldo, M.D.   DATE OF BIRTH:  Sep 05, 1936   DATE OF ADMISSION:  06/21/2007  DATE OF DISCHARGE:  06/22/2007                               DISCHARGE SUMMARY   PRIMARY CARE PHYSICIAN:  Dr. Mosetta Putt.   DISCHARGE DIAGNOSES:  1. Right lower extremity deep venous thrombosis.  2. Hyperlipidemia.  3. Hypertension.  4. History of endometriosis.  5. Chronic back pain secondary to degenerative disk disease.  6. Osteoarthritis.  7. Transient pre-syncope approximately 4 days prior to admission.  8. Cerebrovascular disease.  9. Mild renal insufficiency.   DISCHARGE MEDICATIONS:  1. Lipitor 80 mg daily.  2. Diltiazem XT 240 mg daily.  3. Cozaar 100 mg daily.  4. Ultram 50 mg b.i.d.  5. Mobic 15 mg daily.  6. Ativan 1 mg q.h.s.  7. Neurontin 100 to 300 mg q.h.s. p.r.n.  8. Midrin 2 tablets p.r.n. migraine.  9. Penicillin VK 2 grams prior to dental appointment.  10.Lovenox 80 mg subcutaneously q.12 h. until told to stop.  11.Coumadin 5 mg daily or as directed.   CONSULTATIONS:  None.   BRIEF ADMISSION HISTORY OF PRESENT ILLNESS:  The patient is a 75-year-  old female who was sent to the hospital as a direct admission by her  primary care physician, secondary to findings of A deep venous  thrombosis on Doppler ultrasonography, done as an outpatient.  For the  full details of her HPI, please see my dictated report.   PROCEDURES AND DIAGNOSTIC STUDIES:  None here, but the patient did have  an outpatient lower extremity Doppler studies done, which confirmed a  deep venous thrombosis in the right lower extremity.   DISCHARGE LABORATORY VALUES:  PT/INR was 13.3/1.0.  CBC showed a white  blood cell count of 8.4, hemoglobin 14.3, hematocrit 42.7, platelets  216.  Comprehensive metabolic  profile was within normal limits, except  for a slightly elevated glucose at 101.  There was also some mild renal  insufficiency noted, with a estimated GFR of 58 and a creatinine of  0.96.   HOSPITAL COURSE:   PROBLEM:  1. DVT:  The patient's DVT is likely precipitated by her being on      estrogen therapy.  She was instructed to discontinue this      medication.  She was put on full-dose Lovenox and Coumadin.  She      was taught how to self-inject the Lovenox, and a follow-up      appointment was made with her at Dr. Geoffery Lyons office for PT and      INR testing and to see whether or not she can discontinue the      Lovenox on Monday.  She also watched the teaching videos on      Coumadin and Lovenox, prior to her discharge.  Since this DVT was      most likely precipitated by estrogen use, no further workup  was      done, with regard to seeing if she had a coagulopathy.  2. Hypertension:  The patient's blood pressure was controlled on her      usual home medications.  She was also put on a low-salt diet.  3. Pre-syncope:  The patient had a presyncopal event, approximately 4      days prior to admission.  She had no further events.  She had no      evidence of pulmonary embolism, in that she was not hypoxic,      tachycardiac, tachypneic and showed no abnormalities on 12-lead EKG      monitoring.  She was also monitored on telemetry during the course      of her stay here, which did not show any abnormalities.  4. Hyperlipidemia:  The patient was continued on her usual statin      therapy.  5. Osteoarthritis:  The patient was continued on her usual      medications.   DISPOSITION:  The patient is stable for discharge home.  She has a  follow-up appointment scheduled with Dr. Duaine Dredge on Monday, June 26, 2007 at 3:30 p.m.  A PT and INR will be checked at that time, and the  patient may be discontinued off Lovenox, if she is therapeutic on her  Coumadin.      Hillery Aldo,  M.D.  Electronically Signed     CR/MEDQ  D:  06/22/2007  T:  06/22/2007  Job:  [id number]   cc:   Mosetta Putt, M.D.

## 2011-05-11 NOTE — Assessment & Plan Note (Signed)
[facility]                            CARDIOLOGY OFFICE NOTE   NAME:Waight, [name]                      MRN:          [id number]  DATE:02/21/2008                            DOB:          January 04, 1936    [name] is a very pleasant 75 year old female who I have seen in the  past for cerebrovascular disease.  I initially saw her in December 2005  secondary to atypical chest pain and syncopal episode.  A Myoview showed  no ischemia or infarction and normal LV function and echocardiogram also  showed normal LV function.  Her most recent carotid Dopplers were  performed on October 26, 2007 and showed a 40-59% right internal carotid  artery stenosis and 0 to 39% left.  Since I last saw her, she denies any  dyspnea, chest pain, palpitations or syncope.  There is no pedal edema.  We had increased her Cardizem to 360 mg p.o. daily previously for  improved blood pressure control, but it was decreased back in 240 by Dr.  Duaine [name].  SHE ALSO WAS NOT ABLE TO TOLERATE ASPIRIN AS IT CAUSED NAUSEA  BY HER REPORT.  SHE HAS AN INTOLERANCE TO PLAVIX.   MEDICATIONS:  1. Lipitor 80 mg daily,  2. Diltiazem XT 240 mg daily.  3. Cozaar mg daily.  4. Ultram 50 mg p.o. b.i.d.  5. Mobic.  6. Lorazepam.  7. Neurontin.  8. Midrin.  9. Calcium.  10.Multivitamin.   PHYSICAL EXAM:  Today shows a blood pressure of 177/73 and pulse 78.  She weighs 160 pounds.  HEENT:  Normal.  Neck is supple with no bruits.  CHEST:  Clear.  CARDIOVASCULAR: Regular rate and rhythm. There is 2/6 systolic ejection  murmur at the left sternal border.  ABDOMEN:  exam shows no tenderness.  EXTREMITIES:  Show no edema.   Electrocardiogram today shows sinus rhythm at a rate of 68.  There is  left ventricle hypertrophy, but there are no ST changes noted.   DIAGNOSES:  1. Hypertension - Her blood pressure is elevated today.  However, she      states it runs in the 140/60 range at home.  She  will continue      present medications and track this and she will follow up Dr.      Duaine [name] for further adjustment of her regimen.  2. Cerebrovascular disease - She will need follow-up carotid Dopplers      in October 2009.  She will continue on her statin and her goal LDL      will be less than 70.  She did not tolerate Plavix or aspirin.  3. History of Hyperlipidemia - She will continue her statin and this      is being followed by Dr. Duaine [name].  4. Recent deep vein thrombosis.  Now off of hormone replacement      therapy.   We will see [name] back in 12 months.  Note, she has had no TIA  symptoms including no dysarthria, no weakness or loss of sensation in  her extremities, etc..Marland Kitchen  Madolyn Frieze Jens Som, MD, [facility]  Electronically Signed    BSC/MedQ  DD: 02/21/2008  DT: 02/21/2008  Job #: [id number]   cc:   Mosetta Putt, M.D.

## 2011-05-11 NOTE — H&P (Signed)
[name], [name]               ACCOUNT NO.:  [id number]   MEDICAL RECORD NO.:  [id number]          PATIENT TYPE:  INP   LOCATION:  6703                         FACILITY:  [facility]   PHYSICIAN:  Hillery Aldo, M.D.   DATE OF BIRTH:  [birth year]   DATE OF ADMISSION:  06/21/2007  DATE OF DISCHARGE:                              HISTORY & PHYSICAL   PRIMARY CARE PHYSICIAN:  Mosetta Putt, M.D.   CHIEF COMPLAINT:  2 weeks of swelling and heat sensation in the right  lower extremity.   HISTORY OF PRESENT ILLNESS:  The patient is a 75 year old female who  presented to her primary care physician's office today for evaluation of  her right lower extremity.  The patient states that for the past 2  weeks, she has noticed increased swelling and a sensation of heat.  No  specific pain.  She denies any recent travel.  There is no family  history of blood clotting disorders.  Nevertheless, Dr. Duaine Dredge sent  the patient for a Doppler study which did confirm deep venous thrombosis  in the right lower extremity.  The patient also had an episode 4 days  ago where she became presyncopal which was followed by a mild headache.  She denies any associated shortness of breath or cough.  No sensation of  racing heart.  She is being admitted for further evaluation and  initiation of anticoagulation therapy.   PAST MEDICAL HISTORY:  1. Hyperlipidemia.  2. Hypertension.  3. History of endometriosis.  4. Chronic back pain with degenerative disk disease, status post      multiple epidural injections.  5. Osteoarthritis.  6. History of syncope in the past with a negative cardiac workup.  7. Cerebrovascular disease.   PAST SURGICAL HISTORY:  1. Left hernia repair.  2. Hysterectomy.  3. Cholecystectomy.  4. Right rotator cuff repair.  5. Appendectomy.  6. Bilateral oophorectomies.  7. Laparoscopy.  8. Right ankle surgery secondary to fracture with hardware.  9. Left foot surgery with hardware.  10.Two neck surgeries.  11.Lower back surgery.   ALLERGIES:  SULFA CAUSES HIVES.  CODEINE CAUSES NAUSEA AND VOMITING; A  DERIVATIVE OF CODEINE CAUSES SWELLING OF THE THROAT.  PRINVIL CAUSES  ANGIOEDEMA.  PLAVIX CAUSES TEETH GRINDING AND STRANGE SENSATIONS ABOUT  THE MOUTH.   CURRENT MEDICATIONS:  1. Lipitor 80 mg daily.  2. Estradiol 1 mg daily.  3. Diltiazem XT 240 mg daily.  4. Cozaar 100 mg daily.  5. Ultram 50 mg b.i.d.  6. Mobic 15 mg daily.  7. Ativan 1 mg q.h.s.  8. Neurontin 100 to 300 mg q.h.s. p.r.n.  9. Midrin 2 tablets p.r.n. migraine.  10.Penicillin VK 2 grams prior to dental appointments.   SOCIAL HISTORY:  The patient is married and lives with her spouse in  [place].  She has a remote history of light smoking but quit over 20  years ago.  She denies any alcohol use.  She is a retired [employer] worker.  She finished high school.  She has three offspring.   FAMILY HISTORY:  The patient's mother died at  87 from dementia and  stroke.  She also had hypertension.  The patient's father died at 3  from an acute MI.  He also had hypertension.  There is no family history  of blood clots.   REVIEW OF SYSTEMS:  The patient denies any fever or chills.  Appetite  has been normal.  She denies chest pain.  Again, there was an episode of  presyncope which resolved when she placed her head between her knees for  several minutes.  This happened 4 days ago and was followed by mild  headache.  There was no associated shortness of breath or cough.  She  has a mild sensation of dizziness if she turns her head too rapidly.  She has not had any changes in her bowel habits.  There has not been any  melena or hematochezia.  There has not been any dysuria.  Her energy  level is good.   PHYSICAL EXAMINATION:  VITAL SIGNS:  Temperature 98.7, blood pressure  182/87, pulse 73, respirations 18, O2 saturation 97% on room air.  GENERAL:  A well-developed, well-nourished female who is mildly  obese  but in no acute distress.  HEENT:  Normocephalic, atraumatic.  PERRL.  EOMI.  Oropharynx is clear.  NECK:  Supple, no thyromegaly, no lymphadenopathy.  CHEST:  Lungs clear to auscultation bilaterally with good air movement.  HEART:  Regular rate and rhythm.  No murmurs, rubs, gallops.  ABDOMEN:  Soft, nontender, nondistended with normoactive bowel sounds.  EXTREMITIES:  Asymmetric edema on the right with increased warmth.   LABORATORY DATA:  Pending.  A CBC with differential, comprehensive  metabolic panel, and coagulation studies have been ordered.   ASSESSMENT AND PLAN:  1. Deep venous thrombosis of the right lower extremity.  This is      likely precipitated by the patient's estrogen therapy.  Will      discontinue this and place her on full anticoagulation with Lovenox      and Coumadin.  We will initiate Coumadin teaching.  She will need 3      months of Coumadin.  I would not initiate any further workup as she      has been on estrogen therapy, and this was likely the precipitant.      The patient has been advised that she will not be able to take any      further estrogen treatment.  2. Hyperlipidemia.  Continue the patient's usual dose of Lipitor.  3. Hypertension.  The patient is hypertensive but has not yet had her      diltiazem dose for the day.  Will continue her usual      antihypertensive medications and monitor her blood pressure and      adjust them if needed.  4. Near syncope.  The patient has had this before.  At this time, I      doubt this represents an episode of pulmonary embolism.  She is not      hypoxic nor is she tachycardiac.  Nevertheless, we will check a 12-      lead electrocardiogram, and if this shows any abnormalities, we      will proceed with a CT angiogram of the chest.  5. Chronic back pain/degenerative disk disease.  Continue the      patient's usual anti-inflammatory and pain medications. 6. Prophylaxis.  The patient will be on full  anticoagulation.  We will      hold off on a proton pump  inhibitor for now as her anticipated      length of stay is 24-48 hours.      Hillery Aldo, M.D.  Electronically Signed     CR/MEDQ  D:  06/21/2007  T:  06/22/2007  Job:  [id number]   cc:   Mosetta Putt, M.D.

## 2011-05-11 NOTE — Assessment & Plan Note (Signed)
[facility]                            CARDIOLOGY OFFICE NOTE   NAME:[name], [name]                      MRN:          [id number]  DATE:02/21/2009                            DOB:          02-04-1936    [name] is a very pleasant 75 year old female who has a history of  cerebrovascular disease.  Her most recent carotid Dopplers were  performed on October 29, 2008.  She had a 40-59% right internal carotid  artery stenosis and 0-39% left.  Her most recent echocardiogram on  December 01, 2008, showed normal LV function, trivial aortic  insufficiency, and mild left atrial enlargement.  Finally, she had a  Myoview performed last on December 24, 2004, that was normal with an  ejection fraction of 87%.  Since I last saw her, she denies any  increased dyspnea, chest pain, syncope, or pedal edema.  Her blood  pressure is running high in the 140-150 range.   MEDICATIONS:  1. Lipitor 80 mg p.o. daily.  2. Cardizem 240 mg p.o. daily.  3. Cozaar 100 mg p.o. daily.  4. Ultram.  5. Mobic.  6. Lorazepam.  7. Neurontin.  8. Midrin.  9. Calcium.  10.Multivitamin.  11.Meloxicam.   PHYSICAL EXAMINATION:  VITAL SIGNS:  Today shows a blood pressure of  162/78 and pulse is 70.  HEENT:  Normal.  NECK:  Supple.  CHEST:  Clear.  CARDIOVASCULAR:  Regular rate and rhythm.  ABDOMEN:  No tenderness.  EXTREMITIES:  No edema.   Her electrocardiogram shows sinus rhythm at a rate of 70.  There are no  significant ST changes noted.   DIAGNOSES:  1. Cerebrovascular disease - [name] will need followup carotid      Dopplers in November of this year.  She will continue her statin.      Note, she has not tolerated aspirin or Plavix in the past.  2. Hypertension - blood pressure is elevated today.  I have asked her      to begin HCTZ 12.5 mg p.o. daily.  We will check a BMET to follow      her potassium and renal function in approximately 1 week.  3. History of  hyperlipidemia - she will continue on her statin and Dr.      Duaine Dredge following this.  4. History of deep venous thrombosis.   We will see her back in 12 months or sooner if necessary.     Madolyn Frieze Jens Som, MD, [facility]  Electronically Signed    BSC/MedQ  DD: 02/21/2009  DT: 02/22/2009  Job #: [id number]   cc:   Mosetta Putt, M.D.

## 2011-05-14 NOTE — H&P (Signed)
[name], ISLAM NO.:  [id number]   MEDICAL RECORD NO.:  [id number]          PATIENT TYPE:  EMS   LOCATION:  MAJO                         FACILITY:  [facility]   PHYSICIAN:  Isidor Holts, M.D.  DATE OF BIRTH:  1936-02-11   DATE OF ADMISSION:  11/28/2004  DATE OF DISCHARGE:                                HISTORY & PHYSICAL   PRIMARY CARE PHYSICIAN:  Mosetta Putt, M.D.   NEUROSURGEON:  Stefani Dama, M.D.   CHIEF COMPLAINT:  Black out.   HISTORY OF PRESENT ILLNESS:  This is a 75 year old female with a known  history of hypertension, dyslipidemia, cervical spine degenerative joint  disease. She has had two previous surgeries for cervical spine DJD.  According to patient, pre-dating  these surgeries, were symptoms mainly of  numbness and pain in the arms.  Over the past week she had a recurrence of  these, and as a matter of fact had actually called her neurosurgeon about  them and has an appointment to see him on December 16, 2004. On November 26, 2004 while watching a play at [employer] she had extreme chest  tightness and left hand pain/numbness. She felt quite uncomfortable and at  about 9.00pm, during the intermission as she tried to get up, she felt a bit  unsteady. When her daughter tried to help her into her coat, she suddenly  slumped. She denies antecedent palpitations. According to patient's  daughter who accompanied her to the emergency department, the daughter had  placed her gently on the ground. She was out for 1 minute and by the time  an M.D. who was nearby, rushed over, she had come to. There were no  involuntary movements while she was unconscious.  She came to and tried to  get up and her left arm started jerking. EMS was called.   PAST MEDICAL HISTORY:  1.  Hypertension.  2.  Dyslipidemia.  3.  Status post right rotator cuff repair in 1994.  4.  Status post left inguinal hernia repair.  5.  Status post hysterectomy with right  oophorectomy, followed later by      removal of left ovary.  6.  Gallbladder surgery.  7.  Appendectomy.  8.  Orthopedic surgery left foot in December 29, 2003 with left first, second      and third metatarsal joint fusion and Achilles tendon lengthening in      1999.  9.  Fractured right ankle in 1990.  10. Status post cervical spine surgery in 2000 and 2003.   MEDICATIONS:  1.  Mobic 7.5 mg p.o. daily.  2.  Diltiazem.  3.  Welchol 3 pills p.o. b.i.d.  4.  Estrace 1 mg p.o. daily.  5.  Lipitor 80 mg p.o. daily.  6.  Cozaar 50 mg p.o. daily.  7.  Ultram 50 mg 2 pills daily.  8.  Lorazepam 1 mg p.o. q.h.s.  9.  Neurontin 300 mg p.o. q.h.s.   ALLERGIES:  SULFA, PREMARIN, ASPIRIN, CODEINE, PRINIVIL, PERCOCET, MORPHINE,  DEMEROL.   FAMILY HISTORY/SOCIAL HISTORY:  The patient is married, nonsmoker,  non  drinker. No drug abuse. No siblings. Her mother is 78 years old with  hypertension.  Her father died or prostate cancer at age 45, he had coronary  artery disease and was status post MI.   PHYSICAL EXAMINATION:  VITAL SIGNS:  Temperature 97.7, pulse 70 per minute  and regular, respiratory rate 23, blood pressure 169/80 mmHg.  Pulse  oximetry 99%.  GENERAL:  She appears alert, comfortable, not short of breath at rest.  HEENT:  No pallor, no jaundice, no conjunctival injection or corneal  opacity. Throat is quite unremarkable.  NECK:  Supple, JVP not seen. No palpable lymphadenopathy, no palpable  goiter. No carotid bruits.  CHEST:  Clear to auscultation, no wheezes, no crackles.  CARDIOVASCULAR:  Heart sounds 1 and 2 normal and regular, no murmurs.  ABDOMEN:  Obese, soft and nontender, no palpable organomegaly. No palpable  masses. Normal bowel sounds. Old surgical scars are noted.  EXTREMITIES:  Lower extremity exam shows no pitting edema. Palpable  peripheral pulses.  MUSCULOSKELETAL SYSTEM:  Not fully examined. There are surgical scars noted  in the lumbosacral region and also  in the left side of anterior portion of  the neck.  NEUROVASCULAR:  No neurologic deficit on gross examination.   LABORATORY DATA:  CBC:  White blood cell count 7,400, hemoglobin 15.7,  hematocrit 45.7, platelets 605,000.  Electrolytes - sodium 143, potassium  3.9, chloride 116, CO2 25.1, BUN 17, creatinine 1.0, glucose 107. Troponin I  less than 0.05. Liver function tests are normal.  Head CT scan dated November 28, 2004: Was normal. Chest x-ray dated November 28, 2004 shows no evidence of acute disease.  EKG showed sinus rhythm,  regular, 61/minute, no acute ischemic changes.   ASSESSMENT AND PLAN:  1.  Syncopal episode. Given circumstances, i.e. crowded environment, etc.,      where this occurred, this was likely vasovagal, however will complete      work up including brain MRI, carotid and vertebral duplex, 2-D      echocardiogram, fasting lipid profile. Meanwhile, place patient on      Plavix as she is allergic to aspirin.  Will also carry out neuro checks      periodically.   1.  Chest pain. This sounds atypical. However patient does have risk factors      for coronary artery disease.  We shall cycle cardiac enzymes and      monitor. Also do 12-lead EKG's. She may require a stress-test prior to      discharge.   1.  History of hypertension. Blood pressure is somewhat elevated at the time      of this evaluation. Will monitor and treat appropriately.  The patient      is on unknown dose of Cardizem, will treat with ACE inhibitor for now.   1.  History of dyslipidemia. Will continue her preadmission medications,      i.e., Lipitor and Welchol and also check fasting lipid profile.   1.  History of cervical spine degenerative joint disease. Will continue      Neurontin and Tramadol.  Further management will depend upon clinic course.      Chri   CO/MEDQ  D:  11/29/2004  T:  11/29/2004  Job:  [id number]   cc:   Stefani Dama, M.D. [address]  [place] [address]   Fax: [phone]   Mosetta Putt, M.D.  [address] [place]  [place] [address]  Fax: [phone]

## 2011-05-14 NOTE — Assessment & Plan Note (Signed)
[facility]                            CARDIOLOGY OFFICE NOTE   NAME:[name], [name]                      MRN:          [id number]  DATE:02/03/2007                            DOB:          11-Jan-1936    [name] is a pleasant female, whom I initially evaluated in December  of 2005, secondary to atypical chest pain and a syncopal episode.  At  that time, she had a nuclear study performed on December 24, 2004.  Her  ejection fraction was noted to be 87% and there was no ischemia or  infarction.  Her echocardiogram at that time showed normal LV function  with no significant valvular abnormalities.  She had followup carotid  Dopplers today.  It showed moderate disease.  She now returns for  followup.  Note:  She does not have any dyspnea on exertion, orthopnea,  PND, pedal edema, palpitations, presyncope, syncope or exertional chest  pain.  She has had no neurological symptoms.   Her medications include:  1. Lipitor 80 mg p.o. daily.  2. Estrace 1 mg daily.  3. Diltiazem XT 240 mg p.o. daily.  4. Cozaar 100 mg p.o. daily.  5. Ultram 50 mg p.o. b.i.d.  6. Mobic 15 mg p.o. daily.  7. Lorazepam 1 mg p.o. daily.  8. Neurontin 300 mg p.o. daily.  9. Calcium.  10.Multivitamin.  11.Midrin.   PHYSICAL EXAM:  Physical exam today shows a blood pressure of 174/89 and  a pulse of 75.  She weighs 183 pounds.  NECK:  Her neck is supple.  CHEST:  Her chest is clear.  CARDIOVASCULAR EXAM:  Reveals a regular rate and rhythm.  There is a  soft 1/6 systolic ejection murmur.  EXTREMITIES: Show no edema.   She had an electrocardiogram today that shows a sinus rhythm at 73 and  there are no ST changes noted.   DIAGNOSES:  1. Hypertension.  2. Cerebrovascular disease.  3. History of hyperlipidemia.   PLAN:  [name] is doing well from a symptomatic standpoint and her  previous echocardiogram and Myoview showed normal LV function and there  were no  perfusion abnormalities.  We will continue with medical therapy.  Her blood pressure is elevated today and we will increase her diltiazem  XT to 360 mg p.o. daily.  She will need followup carotid Dopplers in one  year.  Dr. Duaine Dredge is following her lipids and liver and our goal LDL  should be less than 70, given her documented cerebrovascular disease.  She apparently has not tolerated aspirin in the past, secondary to  stomach upset, but I have recommended that she proceed with a trial of  enteric coated aspirin at 81 mg p.o. daily.  If she would tolerate, then  she would benefit from this long-term.  She is scheduled to see Dr.  Duaine Dredge in one month and he will follow up on her blood pressure and  cholesterol.  I will see her back in one year.     Madolyn Frieze Jens Som, MD, [facility]  Electronically Signed    BSC/MedQ  DD: 02/03/2007  DT: 02/03/2007  Job #: [id number]   cc:   Mosetta Putt, M.D.

## 2011-05-14 NOTE — Discharge Summary (Signed)
[facility]  Patient:    [name], [name] Visit Number: [id number] MRN: [id number]          Service Type: GYN Location: 4W 0471 01 Attending Physician:  Lendon Colonel Dictated by:   Kathie Rhodes. Kyra Manges, M.D. Admit Date:  10/17/2001 Discharge Date: 10/20/2001   CC:         Reuel Boom L. Clarke-Pearson, M.D.  Carolyne Fiscal, M.D.   Discharge Summary  ADMITTING DIAGNOSIS;  Pelvic mass.  DISCHARGE DIAGNOSIS:  Pelvic endometriosis.  OPERATION PERFORMED:  Exploratory laparotomy, removal of right-sided pelvic mass, left salpingo-oophorectomy, lysis of adhesions, ureterolysis.  HISTORY OF PRESENT ILLNESS:  Ms. Haddox is a 75 year old female who was found to have a complex mass in the left pelvis and she was complaining of pelvic pain.  CA-125 was elevated.  She had a history of endometriosis.  She also had comorbidities of hypertension and elevated cholesterol and triglycerides.  She also had osteoarthritis.  She was seen in consultation by Dr. De Blanch who agreed that this pelvic mass needed to be removed and he agreed to assist in the surgical procedure.  If it were an ovarian cancer, ______ dissection would be performed.  LABORATORY STUDIES:  Pathology report revealed endometriosis of right pelvic mass and also of left tube and ovary.  Peritoneal washings were benign.  Patient was A positive.  Her metabolic profile was normal.  Her white count on admission was 8000 and a hemoglobin was 14.  Her CA-125 was done on the 16th and was repeated at 876.  Chest x-ray showed no active disease.  She had had a Cardiolite stress test done by Dr. Olga Millers.  This was normal.  HOSPITAL COURSE:  The patient was admitted to the hospital, underwent an uneventful exploratory laparotomy with the aforementioned surgical procedures. The left tube and ovary were retroperitoneal and the colon had to be removed and dissected free along with the  left ureter.  This was accomplished by Dr. De Blanch.  The patient did very well postoperatively, ambulated, and received her appropriate pain medications.  She was afebrile during her stay and, on the day of discharge, was taking fluids nicely and was without nausea or vomiting.  She was asked to return to the office in approximately five days for incision check and to call for fever or bleeding, or nausea or vomiting.  CONDITION ON DISCHARGE:  Improved. Dictated by:   S. Kyra Manges, M.D. Attending Physician:  Lendon Colonel DD:  10/20/01 TD:  10/22/01 Job: [id number]

## 2011-05-14 NOTE — H&P (Signed)
[place]. [facility]  Patient:    [name]                       MRN: [id number] Adm. Date:  [id number] Attending:  Jonne Ply                         History and Physical  ADMITTING DIAGNOSIS:  Cervical spondylosis with radiculopathy and myelopathy, C4-5.  HISTORY OF PRESENT ILLNESS:  The patient is a 75 year old right-handed individual who has had pain radiating down her left arm, with cramp and spasm in the neck,  going on for a years time.  She was treated extensively by Dr. Mosetta Putt and was referred for further consideration of treatment via his courtesy.  She has ad extensive physical therapy modalities with traction for a period of three months which did not seem to help any.  She notes that the pain radiates down into the arm and on occasion will get numbness and tingling into the thumb and index finger n the left hand.  She has no pain in the right upper extremity.  Coughing and sneezing do not seem to aggravate her pain significantly.  Bowel and bladder control has not been affected.  She finds that she cannot sleep lying on her left side at all, and she notes that for the past three months time the pain has been getting worse despite a multitude of medications.  The patient notes that when he pain first started it was rather abrupt.  She was on a cruise in the [place] nd then on the third or fourth night she was awoken with severe spasms in the neck and left shoulder.  She was given shots of Toradol and was treated with heat alternating with cold which seemed to help somewhat.  Nonetheless, the pain persisted.  An MRI of the cervical spine in August 1999 demonstrated that she had spondylitic disease at multiple levels including C4-5, C5-6, and to a lesser extent C6-7.  No overt nerve root compression was demonstrated.  She apparently had seen Dr. Aliene Beams last year and no surgical intervention was  suggested.  Plain x-rays were also noted in her jacket, that demonstrated spondylitic disease at C4-5, 5-6, and C6-7.  At C4-5 she had a 2-3 mm anterolisthesis.  PAST MEDICAL HISTORY:  Her general health has been fair.  She has had significant hypertension and has been under Dr. Polly Cobia care for this process.  MEDICATIONS:  Medications have recently had some switches and she notes that she is on Lipitor, estradiol, triamterene, hydrochlorothiazide, Dilacor, nortriptyline, Cozaar, diclofenac, Ultram, ___________, and currently is taking decreasing doses of prednisone.  She has been on alprazolam to allow her to sleep and Flexeril and Vioxx.  ALLERGIES:  SULFA, CODEINE, and derivatives of MORPHINE including DEMEROL.  She is allergic to PRINIVIL and PERCOCET also.  REVIEW OF SYSTEMS:  Notable that she has night sweats.  She has had significant  history of high blood pressure, high cholesterol, swelling in the feet and hands, endometriosis, broken right ankle in 1990, arm weakness, arm pain, joint pain and swelling, arthritis, and neck pain.  Her height and weight have been stable at 5 feet, 155 pounds.  HABITS:  Habits do not including smoking, or drinking alcohol.  FAMILY HISTORY:  Her mother is age 21 and in good health.  There is no history related to her fathers side of the family.  PHYSICAL EXAMINATION:  GENERAL:  She is an alert, oriented and cooperative individual in no overt distress.  NEUROLOGIC:  Range of motion in her neck is limited, turning only 45 degrees to the right and 60 degrees to the left.  She extends 30 degrees and flexes 20 degrees. Axial compression does not reproduce any significant pain.  Motor strength in the upper extremities reveals the deltoids, biceps, triceps, grips, and intrinsics ave good strength, tone, and bulk.  No masses are palpable in the neck.  No bruits re heard.  There is a 2/6 systolic murmur heard at the manubrial border  on the clavicle and this is best heard during expiration.  Sensation is intact to pin nd light touch in the upper and lower extremities.  Deep tendon reflexes are 2+ in the patellae and the Achilles as they are in the biceps and triceps.  Cranial nerve examination reveals that the pupils are 3 mm, briskly reactive to  light and accommodation.  The extraocular movements are full.  The face is symmetric to grimace.  Tongue and uvula are in the midline.  HEENT:  Sclerae and conjunctivae are clear.  LUNGS:  Clear to auscultation.  HEART:  Regular rate and rhythm.  Murmur as noted above.  ABDOMEN:  Soft.  Bowel sounds positive.  No masses are palpable.  EXTREMITIES:  No cyanosis, clubbing, or edema.  IMPRESSION:  The patient has evidence of cervical spondylosis with radiculopathic and myelopathic changes at C4-5 on a recent magnetic resonance imaging that demonstrates progressive spondylitic changes at C4-5.  She is now admitted to undergo single-level anterior diskectomy and arthrodesis. DD:  11/10/99 TD:  11/10/99 Job: [id number]

## 2011-05-14 NOTE — Op Note (Signed)
[place]. [facility]  Patient:    [name], [name] Visit Number: [id number] MRN: [id number]          Service Type: SUR Location: 3000 [id number] Attending Physician:  Jonne Ply Dictated by:   Stefani Dama, M.D. Proc. Date: 05/07/02 Admit Date:  05/07/2002 Discharge Date: 05/08/2002                             Operative Report  PREOPERATIVE DIAGNOSIS:  Cervical spondylosis, C5-6, C6-7.  Cervical radiculopathy.  POSTOPERATIVE DIAGNOSIS:  Cervical spondylosis, C5-6, C6-7.  Cervical radiculopathy.  OPERATION PERFORMED:  Anterior cervical diskectomy and arthrodesis. Structural allograft, C5-6, C6-7.  Synthes plate fixation, removal of previously placed Synthes hardware, C4-C5.  SURGEON:  Stefani Dama, M.D.  ASSISTANT:  Hewitt Shorts, M.D.  ANESTHESIA:  General endotracheal.  INDICATIONS FOR PROCEDURE:  The patient is a 75 year old individual who has had significant neck, shoulder and arm pain and has failed extensive efforts at conservative management.  In 2000 she underwent a single level anterior diskectomy and arthrodesis at C4-5.  Recent MRI showed that she has had progressive spondylitic changes at C5-6 and C6-7 with some significant left-sided foraminal stenosis at both levels.  She was advised regarding surgical decompression and stabilization.  DESCRIPTION OF PROCEDURE:  The patient was brought to the operating room and placed on the table in supine position.  After the smooth induction of general endotracheal anesthesia, she was placed in five pounds of halter traction. The neck was shaved, prepped with DuraPrep and draped in sterile fashion.  A transverse incision was made in the left side of the neck and carried down through the platysma.  The plane between the sternocleidomastoid and the strap muscles was dissected bluntly until the prevertebral space was reached.  This incision was located inferior to the previously  made incision.  As the prevertebral tissues were dissected free from the prevertebral space.  The previously placed plate was identified.  This was then skeletonized and removed by first removing the locking screws and then the plate.  The patient requested her hardware be saved for her.  The procedure was then continued by opening the C5-6 disk space immediately below the hardware and performing a diskectomy here.  The disk space was noted to be severely degenerated flattened with large lateral osteophytes from the uncinate processes.  These were drilled down with 2.3 mm dissecting tool and an uncinate spur was removed in a piecemeal fashion.  The posterior longitudinal ligament was then decompressed.  Both lateral recesses were decompressed.  Hemostasis from the soft tissues was achieved with bipolar cautery and Gelfoam placed pledgets in the prevertebral space.  A 7 mm tricortical graft was placed in the reverse fashion with cortical surface facing dorsally.  Attention was then turned to C6-7 where similar procedure was carried out and again the disk space was noted to be severely degenerated, markedly desiccated.  This space was drilled out with a 2.3 mm dissecting tool and uncinate process spurs were removed.  A 7 mm tricortical graft was placed in to the interspace and the ventral edges were trimmed smoothly. Hemostasis in the lateral recesses was then achieved meticulously.  A 37 mm standard sized Synthes plate was placed in the reverse fashion with the superior aspect of the plate facing inferiorly to the vertebral body of C7.  Six locking 4 x 14 mm screws were used to affix this. Once  this was achieved, hemostasis was achieved in the soft tissues. Localizing radiograph identified good position of the hardware and fusion. The prevertebral tissues were checked for hemostasis and then the cervical dorsal fascia was closed with 3-0 Vicryl in the platysma and 3-0 Vicryl in  the subcuticular tissues.  The patient tolerated the procedure well and was returned to recovery room in stable condition. Dictated by:   Stefani Dama, M.D. Attending Physician:  Jonne Ply DD:  05/07/02 TD:  05/08/02 Job: [id number]

## 2011-05-14 NOTE — Consult Note (Signed)
[facility]  Patient:    [name], [name] Visit Number: [id number] MRN: [id number]          Service Type: GON Location: GYN Attending Physician:  Jeannette Corpus Dictated by:   Rande Brunt. Clarke-Pearson, M.D. Proc. Date: 09/27/01 Admit Date:  09/27/2001   CC:         Katherine Roan, M.D.  Telford Nab, R.N.   Consultation Report  REASON FOR CONSULTATION:  [age]-year-old white married female referred by Dr. Katherine Roan for evaluation of a pelvic mass and elevated CA125.  The patient initially presented with left lower quadrant pain in mid-September.  She was evaluated further with an ultrasound which showed a left adnexal mass measuring 5.9 x 4.7 x 4.5 cm.  The mass has a homogeneously solid component and demonstrates flow at the periphery of the mass.  There is no evidence of ascites in the pelvis.  The patient subsequently had a CAT scan which shows a 6 x 5 x 5 solid adnexal mass with no evidence of adenopathy, ascites or ureteral obstruction.  She currently is not having any pain but does note some discomfort in the left lower quadrant.  She denies any GI or GU symptoms or any weight loss.  She does not note any abdominal distention.  PAST MEDICAL HISTORY:  The patient underwent a right salpingo-oophorectomy and hysterectomy for endometriosis in 1973.  She apparently had some reoccurrences in 1984 and 1986 and was treated with danazol with good resolution of her symptoms.  MEDICAL ILLNESSES:  Hypertension, hypercholesterolemia, osteoarthritis, fibromyalgia, and a "stroke" in her left eye which has resolved.  PAST SURGICAL HISTORY:  Left lower quadrant hernia repair in 1963, hysterectomy with right salpingo-oophorectomy in 1973 with appendectomy, back surgery in 1975, cholecystectomy in 1988, fracture of right ankle, right rotator cuff repair in 1994, cervical laminectomy, 2000.  CURRENT MEDICATIONS: 1. Lipitor 80 mg a  day. 2. Estradiol 2 mg a day. 3. Ambien 10 mg q.h.s. 4. Cozaar 50 mg b.i.d. 5. Tramadol 50 mg b.i.d. 6. Vioxx 25 mg daily. 7. Penicillin 500 mg one hour prior to appointments.  ALLERGIES:  SULFA, CODEINE, MORPHINE, DEMEROL.  SOCIAL HISTORY:  Patient does not drink or smoke.  She is married.  She has three living children.  Her husband is a Arts administrator.  FAMILY HISTORY:  Family history reveals three aunts who have had breast cancer but there is no ovarian cancer in the family.  REVIEW OF SYSTEMS:  Review of systems otherwise negative.  PHYSICAL EXAMINATION:  VITAL SIGNS:  Temperature 97.8.  Weight 157 pounds.  Blood pressure 150/100. Pulse 68.  Respiratory rate 16.  GENERAL:  Patient is a healthy white female in no acute distress.  HEENT:  Negative.  NECK:  Supple without thyromegaly.  NODES:  There is no supraclavicular or inguinal adenopathy.  MUSCULOSKELETAL:  She does have osteoarthritis in her left wrist with a brace.  ABDOMEN:  Soft and nontender.  No masses, organomegaly, ascites or herniae are noted.  EXTREMITIES:  Lower extremities without edema or varicosities.  PELVIC:  EGBUS normal.  Vagina is clean and well-supported.  Bimanual and rectovaginal exam reveal some vague fullness above the vagina but a discrete mass cannot be palpated.  Rectovaginal exam confirms.  IMPRESSION:  Complex pelvic mass with an elevated CA125, rule out ovarian cancer.  I recommend to the patient that she undergo surgery with intraoperative frozen section.  If this turns out to be a malignancy, I described to  the patient and her husband the extent of surgical staging including pelvic and para-aortic lymphadenectomy, omentectomy and multiple peritoneal biopsies.  The risks of surgery including hemorrhage, infection, injury to adjacent viscera, thromboembolic complications and anesthetic risks were also outlined and they accept these risks and are in agreement to proceed with  surgery.  They would like to defer surgery until October 22nd so that they can go on a planned vacation trip with their family to [place], [place].  We will coordinate the surgery with Dr. Kyra Manges.  Given her prior endometriosis and prior surgery, I think it is imperative we have adequate mechanical bowel preparation and prophylactic antibiotics on board.  All of their questions were answered.  They wish to proceed with surgery as outlined above. Dictated by:   Rande Brunt. Clarke-Pearson, M.D. Attending Physician:  Jeannette Corpus DD:  09/27/01 TD:  09/28/01 Job: [id number]

## 2011-05-14 NOTE — Discharge Summary (Signed)
[name], [name]               ACCOUNT NO.:  [id number]   MEDICAL RECORD NO.:  [id number]          PATIENT TYPE:  INP   LOCATION:  5507                         FACILITY:  [facility]   PHYSICIAN:  [name], D.O.    DATE OF BIRTH:  [birth year]   DATE OF ADMISSION:  11/28/2004  DATE OF DISCHARGE:  11/30/2004                           DISCHARGE SUMMARY - REFERRING   PRIMARY CARE PHYSICIAN:  [name], M.D.   ADMISSION DIAGNOSES:  1.  Near syncope.  2.  Chest discomfort and left arm numbness and tingling.   DISCHARGE DIAGNOSES:  1.  Syncopal event felt to be related to vasovagal episode. She underwent      MRI of the brain this admission that did not reveal evidence of      cerebrovascular accident.  In addition, she underwent cycling cardiac      markers which revealed no evidence of acute infarct and telemetry      monitoring which revealed no arrhythmias.  It was recommended that she      does undergo evaluation in the outpatient setting within the next couple      of weeks, for outpatient stress testing.  In addition, an outpatient  2-      D echocardiogram.  2.  Hypertension, stable throughout hospital course.  3.  Dyslipidemia.  4.  History of  rotator cuff repair.  5.  History of hysterectomy in the past.  6.  History of cholecystectomy and appendectomy.  7.  History of orthopedic surgery on January of this year in addition to      cervical spine surgery per [name].   DISPOSITION:  [name] is being discharged in improved and stable medical  condition. She is ambulatory without evidence of recurrent dizziness or  syncopal events or arrhythmia on monitor.  She is instructed to follow up  with [name] on December 03, 2004, at 11 a.m. on Thursday with  recommendations for outpatient stress testing and 2-D echocardiogram.   HISTORY OF PRESENT ILLNESS:  For full details please refer to H&P as  dictated by [name].  This is a 75 year old female with  a known  history of hypertension, hyperlipidemia, cervical spine degenerative disease  with repeated surgeries for DJD according to the patient part of the  surgeries were for symptoms mainly of numbness and tingling in the arms.  Over the past week she had recurrence of this as a matter of fact and  actually called her neurosurgeon and scheduled to see him on December 19, 2004.  The patient was watching a play and had some chest tightness and hand  pain and numbness.  She was quite uncomfortable.  During the intermission  she felt unsteady.  When her daughter tried to help her put her coat on she  suddenly slumped.  She denies palpitations.  Her daughter placed her gently  on the ground.  She was out for approximately a minute.  At the time, an  M.D. was nearby and rushed to assist.  There were no involuntary movements  when she was unconscious.  She came awake and tried to get up and her left  arm started to jerking.  EMS was dispatched.   HOSPITAL COURSE:  The patient's course was uneventful.  She was monitored on  telemetry without event.  The patient's course was unremarkable.  Cardiac  markers were cycled without evidence of acute ischemic event.  EKG revealed  normal sinus rhythm and she remained hemodynamically stable throughout her  course.   STUDIES PERFORMED:  MRI revealed no evidence of infarct.   Cardiac markers, as noted above were unremarkable.  CBC and basic metabolic  panel were ordered as well and within normal parameters. In her lipid panel,  she had LDL of 34, triglycerides 215.  Her thyroid function was normal.  As  noted above, her comprehensive metabolic panel in addition to her CBC were  unremarkable.   A 2-D echocardiogram  and carotid Dopplers are being recommended to take  place in the outpatient setting.      [name]   ESS/MEDQ  D:  11/30/2004  T:  11/30/2004  Job:  [id number]   cc:   [name], M.D.  [address] [place]  [place] [address]   Fax: [phone]   [name], M.D.  [address]  [place]  [place] [address]  Fax: [phone]

## 2011-05-14 NOTE — H&P (Signed)
[facility]  Patient:    [name], [name]. Visit Number: [id number] MRN: [id number]          Service Type: Attending:  Katherine Roan, M.D. Dictated by:   S. Kyra Manges, M.D.                           History and Physical  CHIEF COMPLAINT:  Abdominal pain.  HISTORY OF PRESENT ILLNESS:  Ms. Malak is a 75 year old gravida 3 female who presented to the office with pelvic pain.  At that time I clinically thought she probably had diverticulitis; however, a pelvic ultrasound was obtained and revealed a mass in her left ovary and her CA-125 is elevated.  She has a past history of endometriosis in the distant past.  Her CT of the abdomen has revealed a solid mass in the left side of the pelvis measuring 6 x 5 x 5 cm. In addition to that she has a small calculus in the right ureter.  Because of the pelvic mass and the elevated CA-125 of 990, laparotomy was suggested with Dr. Serita Kyle as an intraoperative and operative consultant.  CURRENT MEDICATIONS:  Lipitor, Vioxx, and Ultram.  She is on Cozaar, Esterase 2 mg daily, Dilacor XR, Migranal, and Ambien.  ALLERGIES:  She states she is allergic to CODEINE and DEMEROL, MORPHINE, and PERCOCET.  Her allergies are defined as nausea.  She stated she did take one oral pain pill that caused her face to swell.  PAST SURGICAL HISTORY:  She has a history of abdominal hysterectomy, right salpingo-oophorectomy in 1973, lumbar laminectomy in 1975, gallbladder in 1988, and right rotator cuff and anterior cervical fusion.  REVIEW OF SYSTEMS:  HEENT:  She wears glasses.  She has questionably had a stroke in the left eye, stroke on the left side which cleared up, apparently was a transient ischemic episode.  She describes her migraine headaches as mostly auras with some pressure in her head, but not much discomfort.  This is treated easily with Migranal, which has been given to her by Dr. Duaine Dredge. HEART:  She  has a history of a heart murmur and hypertension which is well-controlled with the above medication.  LUNGS:  No cough or night sweats. No hemoptysis.  GENITOURINARY:  No frequency or stress incontinence. GASTROINTESTINAL:  She denies bowel habit change or weight loss or gain.  No anorexia.  MUSCLES, BONES, JOINTS:  She has osteoarthritis.  She takes Vioxx daily for this.  FAMILY HISTORY:  Her mother is 12 and living.  Her father died with a myocardial infarction and had cancer of the prostate.  She has a maternal aunt with cancer of the breast.  PHYSICAL EXAMINATION:  GENERAL:  Reveals a well-developed, well-nourished female, somewhat apprehensive.  She appears to be her stated age.  VITAL SIGNS:  Weight 158 pounds.  Blood pressure 140/80.  HEENT:  Unremarkable.  Oropharynx is not injected.  NECK:  Normal.  Carotid pulses are equal.  Thyroid is not enlarged.  BREASTS:  No masses or tenderness.  Axilla free from adenopathy.  LUNGS:  Clear to P&A.  HEART:  Normal sinus rhythm, no murmurs, no heaves, thrills, rubs, or gallops.  ABDOMEN:  Soft, flat.  There is a low midline incision which is well-healed. No tenderness is noted.  EXTREMITIES:  Show good range of motion and equal pulses and reflexes.  Trace of edema.  PELVIC:  Reveals a well-supported vaginal vault.  There  is a little distinct fullness in the left adnexa, but I can outline the described mass on ultrasound and CT.  RECTAL:  Hemoccult is negative.  IMPRESSION:  Pelvic mass.  PLAN:  Laparotomy with oncology support.  Risks and benefits have been discussed with the patient.  She is on bowel prep. Dictated by:   S. Kyra Manges, M.D. Attending:  Katherine Roan, M.D. DD:  10/16/01 TD:  10/16/01 Job: [id number]

## 2011-05-14 NOTE — Op Note (Signed)
[facility]  Patient:    [name], [name] Visit Number: [id number] MRN: [id number]          Service Type: GYN Location: 4W 0471 01 Attending Physician:  Jeannette Corpus Dictated by:   Rande Brunt. Clarke-Pearson, M.D. Proc. Date: 10/17/01 Admit Date:  10/17/2001   CC:         Katherine Roan, M.D.             Telford Nab, M.D.                           Operative Report  PREOPERATIVE DIAGNOSES:  Complex pelvic mass with elevated CA-125.  POSTOPERATIVE DIAGNOSES:  1. Endometriosis with endometrioma. 2. Left ovarian fibroma. 3. Retroperitoneal fibrosis.  OPERATIVE PROCEDURES:  Exploratory laparotomy;  CO-SURGEONS:  Katherine Roan, M.D. and Rande Brunt. Clarke-Pearson, M.D.  ANESTHESIA:  General endotracheal.  ESTIMATED BLOOD LOSS:  250 cc.  INTRAOPERATIVE FINDINGS:  At exploratory laparotomy the upper abdomen was normal except for dense adhesions in the right upper quadrant from the patients prior cholecystectomy. The left diaphragm and left lobe of the liver were palpably normal as was the remainder of the upper abdomen. The omentum was adherent to the anterior abdominal wall but was otherwise normal. There was no evidence of ascites or retroperitoneal adenopathy. There was a 4 cm multicystic mass on the right pelvic sidewall which after resection was determined to be endometriosis. The left pelvic sidewall had a large, approximately 6 cm cystic mass which was adherent to the sidewall and sigmoid mesentery. On frozen section this was also determined to be endometriosis with endometrial hyperplasia and an ovarian fibroma. No evidence of malignancy was found.  DESCRIPTION OF PROCEDURE:  The patient was taken to the operating room and after satisfactory attainment of general anesthesia was placed in the modified lithotomy position in [place] stirrups. The anterior abdominal wall, perineum and vagina were prepped with Betadine, a Foley  catheter was placed and the patient was draped. The abdomen was entered through a previous low midline incision and peritoneal washings were obtained. Adhesions of the omentum to the anterior abdominal wall were lysed. The upper abdomen and pelvis were explored with the above noted findings. A Bookwalter retractor was assembled and bowel was packed out of the pelvis. Small bowel adhesions to the right pelvic sidewall mass were lysed with sharp dissection. In the right retroperitoneal space was opened identifying the external iliac artery and vein and ureter. The ureter was adherent to the lateral aspect of the mass and therefore ureterolysis was performed. Once the ureter was deflected laterally, the remainder of the peritoneum on the pelvis sidewall was incised using cautery in order to remove the mass. This was submitted for frozen section and returned as endometriosis. Hemostasis on this sidewall was achieved with hemoclips and cautery.  Attention was turned to the left side of the pelvis. The retroperitoneal space was again opened identifying the external iliac artery and vein, ureter and ovarian vessels. The ovarian vessels were skeletonized, clamped, cut and free tied and suture ligated. Using sharp and blunt dissection and cautery, the mass was circumscribed. In order to achieve this portion of the sigmoid mesentery was entered and dissected away from the mass in order to completely resect the mass and this wall. Throughout the dissection the ureter was further mobilized laterally achieving the ureterolysis on the left side as well. Once the ureter was protected, further dissection was made circumferentially  in order to mobilize the mass from the deeper portion of the pelvis. The vaginal angle was encountered and this was clamped, divided and suture ligated using 2-0 Vicryl. The mass was submitted as frozen section with the above noted findings. Hemostasis of the pelvis was again  achieved with cautery and hemoclips. The ureter was reinspected and appeared to be intact as was the colon. A defect in the sigmoid mesentery was closed with a running suture of 2-0 Vicryl. The pelvis was irrigated with copious amounts of warm saline. The packs and retractors and the anterior abdominal wall closed in layers, the first layer being a running mass closure using #1 PDS. The subcutaneous tissue was irrigated and cautery was used to achieve hemostasis and the skin was closed with skin staples. Dressing was applied. The patient was awakened from anesthesia and taken to the recovery room in satisfactory condition. Sponge, needle, lap and instrument counts were correct x2.Dictated by:   Rande Brunt. Clarke-Pearson, M.D. Attending Physician:  Jeannette Corpus DD:  10/17/01 TD:  10/18/01 Job: [id number]

## 2011-05-14 NOTE — Op Note (Signed)
NAME:  [name], [name]                         ACCOUNT NO.:  [id number]   MEDICAL RECORD NO.:  [id number]                   PATIENT TYPE:  AMB   LOCATION:  DSC                                  FACILITY:  [facility]   PHYSICIAN:  Leonides Grills, M.D.                  DATE OF BIRTH:  01-03-1936   DATE OF PROCEDURE:  01/14/2004  DATE OF DISCHARGE:                                 OPERATIVE REPORT   PREOPERATIVE DIAGNOSES:  1. Right first, second, third tarsometatarsal joint arthritis.  2. Left tight Achilles tendon.   POSTOPERATIVE DIAGNOSES:  1. Right first, second, third tarsometatarsal joint arthritis.  2. Left tight Achilles tendon.   OPERATION PERFORMED:  1. Left first, second and third tarsometatarsal joint fusion.  2. Left local bone graft.  3. Left intercuneiform fusion.  4. Left stress x-rays, foot.  5. Left percutaneous tendo-Achilles lengthening.   SURGEON:  Leonides Grills, M.D.   ASSISTANT:  Lianne Cure, P.A.   ANESTHESIA:  General endotracheal tube.   ESTIMATED BLOOD LOSS:  Minimal.   COMPLICATIONS:  None.   DISPOSITION:  Stable to PR.   INDICATIONS FOR PROCEDURE:  The patient is a 75 year old female who has had  longstanding medial and midfoot pain.  It is interfering with her life.  The  patient  has consented for the above procedure.  All risks which include  infection, neurovascular injury, nonunion, malunion, hardware irritation,  hardware failure, persistent pain, worsening pain, stiffness, arthritis of  neighboring joints were all explained, questions were encouraged and  answered.   DESCRIPTION OF PROCEDURE:  The patient was brought to the operating room and  placed in supine position after adequate general endotracheal tube  anesthesia was administered as well as Ancef 1 g IV piggyback.  The left  lower extremity was then prepped and draped in sterile manner over a  proximally placed thigh tourniquet.  The limb was gravity exsanguinated and  the  tourniquet was elevated to 290 mmHg.  Two medial and one lateral  hemisection of the Achilles tendon was then made and an excellent release of  the tight Achilles tendon made.  Longitudinal incision between the EHL and  EHB tendons dorsally.  Dissection was carried down through skin and soft  tissue meticulously dissected.  Soft tissue was elevated off the first and  second tarsometatarsal joints respectively and extended to the  intercuneiform joint area as well and elevating directly off the bone to  protect the dorsalis pedis artery as well as the deep peroneal nerve.  Once  this was elevated adequately and the joints were identified, the joints were  removed of any remaining cartilage with a curved quarter inch curet as well  as synovectomy rongeur.  Multiple 2 mm drill holes were placed on either  side of the joint respectively at the first and second intercuneiform joint  area.  Once this was done, two point reduction  clamps were used to reduce  each individual joint respectively and 3.5 mm fully threaded cortical set  screws were then placed through a stab wound medially for the intercuneiform  joint in the second tarsometatarsal joint fusion area. Clamps were removed.  C-arm views were obtained in AP lateral and oblique planes that showed  excellent position of hardware.  We then used the bur to bur a notch in the  base of the first metatarsal.  Clamp was then used to reduce and compress  the first tarsometatarsal joint.  A 2.5 mm drill hole was then placed and a  3.5 mm fully threaded cortical screw was then placed across the first  tarsometatarsal joint.  This had excellent maintenance of compression across  the first tarsometatarsal joint as well.  We then made a longitudinal  incision over the third tarsometatarsal joint and dissection was carried  down through skin.  Tendons were retracted both medially and laterally.  Approach was made directly to the third tarsometatarsal  joint.  Soft tissue  was elevated around the joint as well as toward the base of the third  metatarsal for fixation and placement.  The joint was entered and verified  under C-arm guidance in the oblique plain.  We then removed the remaining  cartilage from this joint with a curved quarter inch osteotome as well as  with synovectomy rongeur.  Multiple 2 mm drill holes were placed on either  side of this joint.  A two point reduction clamp was used to compress this  joint and a 3.5 mm fully threaded cortical set screw was then placed using a  1.5 mm drill hole respectively.  Stress x-rays were obtained in the AP  lateral and oblique plane and this showed stable reduction as well.  The  wounds were then copiously irrigated with normal saline.  Local bone graft  obtained throughout the procedure from spurs as well as from drills were  placed as stress-strain relieving bone graft over each individual fusion  site respectively using a bur as well.  The tourniquet was deflated  hemostasis was obtained.  There was palpable dorsalis pedis pulse.  The skin  was closed with 3-0 Vicryl subcu and skin was closed with 4-0 nylon sutures.  Sterile dressing was applied.  Modified Jones dressing was applied with the  ankle in neutral dorsiflexion.  The patient was stable to the PR.                                               Leonides Grills, M.D.    PB/MEDQ  D:  01/14/2004  T:  01/15/2004  Job:  [id number]

## 2011-05-14 NOTE — H&P (Signed)
[facility]  Patient:    [name], [name] Visit Number: [id number] MRN: [id number]          Service Type: SUR Location: 3000 3011 01 Attending Physician:  Jonne Ply Dictated by:   Stefani Dama, M.D. Admit Date:  05/07/2002                           History and Physical  ADMITTING DIAGNOSIS:  Cervical spondylosis, C5-6 and C6-7, with cervical radiculopathy.  HISTORY OF PRESENT ILLNESS:  The patient is a 75 year old individual who had anterior cervical diskectomy in 2000 at the C4-5 level, and she presented about a month ago with complaints of increasing neck pain.  She had a rotator cuff repair on the right side in 1994 and has had pain in the region of the right shoulder with radiation into her right arm.  She was having continued problems on that right side and was ultimately referred for an MRI, which demonstrated the presence of significant spondylitic disease at C5-6 and C6-7. The patient was seen in the office again and the films were reviewed and it was felt that because of the spondylitic changes with foraminal stenosis, she could be relieved by anterior cervical diskectomies at C5-6 and C6-7; she is now being admitted for this procedure.  PAST MEDICAL HISTORY:  Her past medical history reveals that her health has been fair.  She has had some hypertension in the past and has been under the care of Dr. Carolyne Fiscal.  MEDICATIONS:  Medications include Lipitor, Isordil, triamterene, hydrochlorothiazide, Dilacor, nortriptyline, Cozaar and diclofenac; she has also been on some Ultram for pain.  ALLERGIES:  Allergies include SULFA and CODEINE DERIVATIVES SUCH AS MORPHINE AND DEMEROL; she also notes allergies to PRINIVIL and PERCOCET.  REVIEW OF SYSTEMS:  Systems review is notable for a history of night sweats. She has some high blood pressure, high cholesterol, swelling in the feet and hands, history of endometriosis,  broken right ankle in 1990, arm weakness, arm pain, joint pain and swelling.  Height and weight have been stable at 5 feet and 150 pounds.  HABITS:  The patient does not smoke.  She does not drink alcohol.  FAMILY HISTORY:  Mother is age 70 and in good health.  There is no history related to her fathers side of the family.  PHYSICAL EXAMINATION:  GENERAL:  She is alert and cooperative individual in no overt distress.  MUSCULOSKELETAL:  Range of motion in her neck is limited, turning only 45 degrees to the right and 60 degrees to the left.  She extends 30 degrees and flexes 20 degrees.  Axial compression does not reproduce any significant pain.  Motor strength in the upper extremities reveals the deltoids, biceps, triceps, grips and intrinsics have good strength, tone and bulk to confrontation.  No masses are palpable in the neck and no bruits are heard.  CARDIAC:  There is a 2/6 systolic murmur heard at the manubrial border.  Heart has a regular rate and rhythm, murmur has noted above.  NEUROLOGIC:  Sensation is intact to pin and light touch in the upper and lower extremities.  Deep tendon reflexes are 2+ in the patellae and the Achilles, as they are in the biceps and the triceps.  Cranial nerve examination reveals her pupils are 3 mm and briskly reactive to light and accommodation.  The extraocular movements are full.  Face is symmetric to grimace.  Tongue and uvula are in the midline.  HEENT:  Sclerae and conjunctivae clear.  LUNGS:  Clear to auscultation.  ABDOMEN:  Soft.  Bowel sounds are positive.  No masses are palpable.  EXTREMITIES:  No cyanosis, clubbing or edema.  IMPRESSION:  The patient has evidence of spondylitis disease at C5-6 and C6-7. She has had a previous anterior diskectomy and arthrodesis at C4-5.  She is now being admitted to undergo surgical decompression and arthrodesis at the two levels most involved. Dictated by:   Stefani Dama, M.D. Attending  Physician:  Jonne Ply DD:  05/07/02 TD:  05/08/02 Job: [id number]

## 2011-08-19 ENCOUNTER — Encounter: Payer: Medicare Other | Admitting: Cardiology

## 2011-08-20 ENCOUNTER — Ambulatory Visit (HOSPITAL_BASED_OUTPATIENT_CLINIC_OR_DEPARTMENT_OTHER)
Admission: RE | Admit: 2011-08-20 | Discharge: 2011-08-20 | Disposition: A | Discharge status: Home / Self Care | Payer: Medicare Other | Source: Ambulatory Visit | Attending: Urology | Admitting: Urology

## 2011-08-20 DIAGNOSIS — Z01812 Encounter for preprocedural laboratory examination: Secondary | ICD-10-CM | POA: Insufficient documentation

## 2011-08-20 DIAGNOSIS — N200 Calculus of kidney: Secondary | ICD-10-CM | POA: Insufficient documentation

## 2011-08-20 DIAGNOSIS — R310 Gross hematuria: Secondary | ICD-10-CM | POA: Insufficient documentation

## 2011-08-20 DIAGNOSIS — R109 Unspecified abdominal pain: Secondary | ICD-10-CM | POA: Insufficient documentation

## 2011-08-20 DIAGNOSIS — Z01810 Encounter for preprocedural cardiovascular examination: Secondary | ICD-10-CM | POA: Insufficient documentation

## 2011-08-20 LAB — POCT I-STAT 4, (NA,K, GLUC, HGB,HCT): HCT: 43 % (ref 36.0–46.0)

## 2011-08-23 ENCOUNTER — Ambulatory Visit (HOSPITAL_COMMUNITY)
Admission: RE | Admit: 2011-08-23 | Discharge: 2011-08-23 | Disposition: A | Discharge status: Home / Self Care | Payer: Medicare Other | Source: Ambulatory Visit | Attending: Urology | Admitting: Urology

## 2011-08-23 DIAGNOSIS — Z79899 Other long term (current) drug therapy: Secondary | ICD-10-CM | POA: Insufficient documentation

## 2011-08-23 DIAGNOSIS — Z01812 Encounter for preprocedural laboratory examination: Secondary | ICD-10-CM | POA: Insufficient documentation

## 2011-08-23 DIAGNOSIS — Z01818 Encounter for other preprocedural examination: Secondary | ICD-10-CM | POA: Insufficient documentation

## 2011-08-23 DIAGNOSIS — Z86718 Personal history of other venous thrombosis and embolism: Secondary | ICD-10-CM | POA: Insufficient documentation

## 2011-08-23 DIAGNOSIS — M069 Rheumatoid arthritis, unspecified: Secondary | ICD-10-CM | POA: Insufficient documentation

## 2011-08-23 DIAGNOSIS — Z7982 Long term (current) use of aspirin: Secondary | ICD-10-CM | POA: Insufficient documentation

## 2011-08-23 DIAGNOSIS — E119 Type 2 diabetes mellitus without complications: Secondary | ICD-10-CM | POA: Insufficient documentation

## 2011-08-23 DIAGNOSIS — Z8673 Personal history of transient ischemic attack (TIA), and cerebral infarction without residual deficits: Secondary | ICD-10-CM | POA: Insufficient documentation

## 2011-08-23 DIAGNOSIS — N200 Calculus of kidney: Secondary | ICD-10-CM | POA: Insufficient documentation

## 2011-08-23 LAB — GLUCOSE, CAPILLARY: Glucose-Capillary: 120 mg/dL — ABNORMAL HIGH (ref 70–99)

## 2011-08-23 NOTE — Op Note (Signed)
  NAMENABA, SNEED NO.:  [id number]  MEDICAL RECORD NO.:  [id number]  LOCATION:  DAY                          FACILITY:  [facility]  PHYSICIAN:  Bertram Millard. Yates Weisgerber, M.D.DATE OF BIRTH:  10-Dec-1936  DATE OF PROCEDURE:  08/20/2011 DATE OF DISCHARGE:                              OPERATIVE REPORT   PREOPERATIVE DIAGNOSIS:  Large right renal calculus.  POSTOPERATIVE DIAGNOSIS:  Large right renal calculus.  PROCEDURE:  Cystoscopy, right double-J stent placement (6-French x 24 cm contour stent without string).  SURGEON:  Bertram Millard. German Manke, M.D.  ANESTHESIA:  General with LMA.  COMPLICATIONS:  None.  BRIEF HISTORY:  A 75 year old female who was treated in June 2011, for left distal ureteral stone.  She had pyelonephritis at that time.  She has been followed since that time, her infection has cleared.  She has had intermittent flank pain, but recently has had gross hematuria.  In October 2011, she was known to have a right renal pelvic stone, which was asymptomatic.  CT scan recently revealed an enlarging stone in her pelvis, which is most likely producing the patient's symptoms.  It is recommended, due to the size of this large stone, that she have a stent followed by lithotripsy.  Risks and complications of stent placement have been discussed with the patient and her husband.  They understand these and desire to proceed.  DESCRIPTION OF PROCEDURE:  The patient was identified in the holding area.  She received preoperative IV Cipro and her surgical site was correctly marked.  She was taken to the operating room where general anesthetic was administered using LMA.  She was placed in the dorsal lithotomy position.  Genitalia and perineum were prepped and draped. Time-out was then performed.  The procedure then commenced.  A 22-French panendoscope was advanced into her bladder.  The bladder was inspected circumferentially. Ureteral orifices were normal  configuration and location.  The bladder appeared normal without tumors, trabeculations or foreign bodies.  The right ureteral orifice was cannulated with a sensor tip guidewire, which was advanced fluoroscopically into her renal pelvis past the opaque area, which was  most likely the renal calculus.  Once a good curl was seen, a 24-cm 6-French contour stent without a string was placed over the top of the guidewire, with the guidewire removed, good curls were seen proximally and distally.  At this point, the bladder was drained, the scope removed, and the procedure terminated.  She was awakened and taken to PACU in stable condition.  She was sent home on cephalexin nightly for the next few nights, as well as prescription for Ditropan.  She will be followed up in 3 days for lithotripsy of the right renal stone.     Bertram Millard. Willistine Ferrall, M.D.     SMD/MEDQ  D:  08/20/2011  T:  08/20/2011  Job:  [id number]  cc:   Mosetta Putt, M.D. Fax: [phone]  Electronically Signed by Marcine Matar M.D. on 08/23/2011 11:11:15 AM

## 2011-09-17 ENCOUNTER — Other Ambulatory Visit: Payer: Self-pay | Admitting: Family Medicine

## 2011-09-17 DIAGNOSIS — I6529 Occlusion and stenosis of unspecified carotid artery: Secondary | ICD-10-CM

## 2011-09-20 ENCOUNTER — Encounter (INDEPENDENT_AMBULATORY_CARE_PROVIDER_SITE_OTHER): Payer: Medicare Other | Admitting: *Deleted

## 2011-09-20 DIAGNOSIS — I6529 Occlusion and stenosis of unspecified carotid artery: Secondary | ICD-10-CM

## 2011-10-13 LAB — DIFFERENTIAL
Basophils Absolute: 0.2 — ABNORMAL HIGH
Basophils Relative: 2 — ABNORMAL HIGH
Eosinophils Relative: 2
Monocytes Absolute: 0.6

## 2011-10-13 LAB — CBC
HCT: 42.7
HCT: 46.4 — ABNORMAL HIGH
MCHC: 33.6
MCV: 96.4
Platelets: 210
Platelets: 216
RBC: 4.81
RDW: 14.4 — ABNORMAL HIGH
WBC: 8.4

## 2011-10-13 LAB — COMPREHENSIVE METABOLIC PANEL
AST: 23
Albumin: 4.1
Alkaline Phosphatase: 79
BUN: 15
CO2: 25
Chloride: 107
GFR calc Af Amer: >60
Potassium: 3.8
Total Bilirubin: 0.7

## 2012-02-02 ENCOUNTER — Other Ambulatory Visit: Payer: Self-pay | Admitting: Family Medicine

## 2012-02-02 DIAGNOSIS — M5136 Other intervertebral disc degeneration, lumbar region: Secondary | ICD-10-CM

## 2012-02-09 ENCOUNTER — Ambulatory Visit
Admission: RE | Admit: 2012-02-09 | Discharge: 2012-02-09 | Disposition: A | Discharge status: Home / Self Care | Payer: Medicare Other | Source: Ambulatory Visit | Attending: Family Medicine | Admitting: Family Medicine

## 2012-02-09 DIAGNOSIS — M5136 Other intervertebral disc degeneration, lumbar region: Secondary | ICD-10-CM

## 2012-02-09 NOTE — Discharge Instructions (Signed)

## 2012-03-20 ENCOUNTER — Other Ambulatory Visit: Payer: Self-pay | Admitting: Cardiology

## 2012-03-20 DIAGNOSIS — I6529 Occlusion and stenosis of unspecified carotid artery: Secondary | ICD-10-CM

## 2012-03-23 ENCOUNTER — Encounter (INDEPENDENT_AMBULATORY_CARE_PROVIDER_SITE_OTHER): Payer: Medicare Other

## 2012-03-23 ENCOUNTER — Encounter: Payer: Medicare Other | Admitting: Cardiology

## 2012-03-23 DIAGNOSIS — I6529 Occlusion and stenosis of unspecified carotid artery: Secondary | ICD-10-CM

## 2012-04-12 ENCOUNTER — Ambulatory Visit
Admission: RE | Admit: 2012-04-12 | Discharge: 2012-04-12 | Disposition: A | Discharge status: Home / Self Care | Payer: Medicare Other | Source: Ambulatory Visit | Attending: Family Medicine | Admitting: Family Medicine

## 2012-04-12 ENCOUNTER — Other Ambulatory Visit: Payer: Self-pay | Admitting: Family Medicine

## 2012-04-12 DIAGNOSIS — M79605 Pain in left leg: Secondary | ICD-10-CM

## 2012-04-12 MED ORDER — IOHEXOL 180 MG/ML  SOLN
1.0000 mL | Freq: Once | INTRAMUSCULAR | Status: AC | PRN
  Administered 2012-04-12: 1 mL via EPIDURAL

## 2012-04-12 MED ORDER — METHYLPREDNISOLONE ACETATE 40 MG/ML INJ SUSP (RADIOLOG
120.0000 mg | Freq: Once | INTRAMUSCULAR | Status: AC
  Administered 2012-04-12: 120 mg via EPIDURAL

## 2012-04-25 ENCOUNTER — Other Ambulatory Visit: Payer: Self-pay | Admitting: Family Medicine

## 2012-04-25 DIAGNOSIS — M79605 Pain in left leg: Secondary | ICD-10-CM

## 2012-05-03 ENCOUNTER — Other Ambulatory Visit: Payer: Self-pay | Admitting: Family Medicine

## 2012-05-03 ENCOUNTER — Ambulatory Visit
Admission: RE | Admit: 2012-05-03 | Discharge: 2012-05-03 | Disposition: A | Discharge status: Home / Self Care | Payer: Medicare Other | Source: Ambulatory Visit | Attending: Family Medicine | Admitting: Family Medicine

## 2012-05-03 DIAGNOSIS — M79605 Pain in left leg: Secondary | ICD-10-CM

## 2012-05-03 MED ORDER — METHYLPREDNISOLONE ACETATE 40 MG/ML INJ SUSP (RADIOLOG
120.0000 mg | Freq: Once | INTRAMUSCULAR | Status: AC
  Administered 2012-05-03: 120 mg via EPIDURAL

## 2012-05-03 MED ORDER — IOHEXOL 180 MG/ML  SOLN
1.0000 mL | Freq: Once | INTRAMUSCULAR | Status: AC | PRN
  Administered 2012-05-03: 1 mL via INTRAVENOUS

## 2012-06-09 ENCOUNTER — Other Ambulatory Visit: Payer: Self-pay | Admitting: Family Medicine

## 2012-06-09 DIAGNOSIS — M545 Low back pain: Secondary | ICD-10-CM

## 2012-06-15 ENCOUNTER — Ambulatory Visit
Admission: RE | Admit: 2012-06-15 | Discharge: 2012-06-15 | Disposition: A | Discharge status: Home / Self Care | Payer: Medicare Other | Source: Ambulatory Visit | Attending: Family Medicine | Admitting: Family Medicine

## 2012-06-15 DIAGNOSIS — M545 Low back pain: Secondary | ICD-10-CM

## 2012-08-14 ENCOUNTER — Other Ambulatory Visit: Payer: Self-pay | Admitting: Family Medicine

## 2012-08-14 DIAGNOSIS — M5416 Radiculopathy, lumbar region: Secondary | ICD-10-CM

## 2012-08-15 ENCOUNTER — Other Ambulatory Visit: Payer: Self-pay | Admitting: Family Medicine

## 2012-08-15 ENCOUNTER — Ambulatory Visit
Admission: RE | Admit: 2012-08-15 | Discharge: 2012-08-15 | Disposition: A | Discharge status: Home / Self Care | Payer: Medicare Other | Source: Ambulatory Visit | Attending: Family Medicine | Admitting: Family Medicine

## 2012-08-15 DIAGNOSIS — M5416 Radiculopathy, lumbar region: Secondary | ICD-10-CM

## 2012-08-15 MED ORDER — METHYLPREDNISOLONE ACETATE 40 MG/ML INJ SUSP (RADIOLOG
120.0000 mg | Freq: Once | INTRAMUSCULAR | Status: AC
  Administered 2012-08-15: 120 mg via EPIDURAL

## 2012-08-15 MED ORDER — IOHEXOL 180 MG/ML  SOLN
1.0000 mL | Freq: Once | INTRAMUSCULAR | Status: AC | PRN
  Administered 2012-08-15: 1 mL via EPIDURAL

## 2012-09-19 ENCOUNTER — Ambulatory Visit (HOSPITAL_COMMUNITY): Payer: Medicare Other | Attending: Cardiology

## 2012-09-19 ENCOUNTER — Other Ambulatory Visit (HOSPITAL_COMMUNITY): Payer: Self-pay | Admitting: Family Medicine

## 2012-09-19 DIAGNOSIS — R55 Syncope and collapse: Secondary | ICD-10-CM

## 2012-09-19 DIAGNOSIS — I379 Nonrheumatic pulmonary valve disorder, unspecified: Secondary | ICD-10-CM | POA: Insufficient documentation

## 2012-09-19 DIAGNOSIS — I080 Rheumatic disorders of both mitral and aortic valves: Secondary | ICD-10-CM | POA: Insufficient documentation

## 2012-09-19 DIAGNOSIS — I369 Nonrheumatic tricuspid valve disorder, unspecified: Secondary | ICD-10-CM | POA: Insufficient documentation

## 2012-09-19 NOTE — Progress Notes (Signed)
Echocardiogram performed.  

## 2012-09-20 ENCOUNTER — Encounter (HOSPITAL_COMMUNITY): Payer: Self-pay | Admitting: Family Medicine

## 2012-09-21 ENCOUNTER — Other Ambulatory Visit: Payer: Self-pay | Admitting: Family Medicine

## 2012-09-21 DIAGNOSIS — M79602 Pain in left arm: Secondary | ICD-10-CM

## 2012-09-22 ENCOUNTER — Ambulatory Visit
Admission: RE | Admit: 2012-09-22 | Discharge: 2012-09-22 | Disposition: A | Discharge status: Home / Self Care | Payer: Medicare Other | Source: Ambulatory Visit | Attending: Family Medicine | Admitting: Family Medicine

## 2012-09-22 DIAGNOSIS — M79602 Pain in left arm: Secondary | ICD-10-CM

## 2012-10-02 ENCOUNTER — Encounter: Payer: Self-pay | Admitting: Vascular Surgery

## 2012-10-03 ENCOUNTER — Ambulatory Visit (INDEPENDENT_AMBULATORY_CARE_PROVIDER_SITE_OTHER): Payer: Medicare Other | Admitting: Vascular Surgery

## 2012-10-03 ENCOUNTER — Encounter: Payer: Self-pay | Admitting: Vascular Surgery

## 2012-10-03 ENCOUNTER — Ambulatory Visit (INDEPENDENT_AMBULATORY_CARE_PROVIDER_SITE_OTHER): Payer: Medicare Other

## 2012-10-03 VITALS — BP 150/62 | HR 116 | Resp 18 | Ht 58.5 in | Wt 155.0 lb

## 2012-10-03 DIAGNOSIS — I6529 Occlusion and stenosis of unspecified carotid artery: Secondary | ICD-10-CM

## 2012-10-03 DIAGNOSIS — I63239 Cerebral infarction due to unspecified occlusion or stenosis of unspecified carotid arteries: Secondary | ICD-10-CM

## 2012-10-03 NOTE — Progress Notes (Signed)
Subjective:     Patient ID: [name], female   DOB: [birth year], 76 y.o.   MRN: [id number]  HPI this 76 year old female was referred by Dr. Duaine Dredge for evaluation of right carotid occlusive disease. Patient has no active symptoms of TIAs or stroke including lateralizing weakness, amaurosis fugax, diplopia, blurred vision, or syncope. She did have an episode for 5 years ago with some weakness on the left side. She had severe shingles involving the left thigh at that time. She also had severe back problems which required outpatient vancomycin because of infection. She has had multiple operations on her lumbar and cervical spine and continues to have some discomfort in the left leg related to this. Medical record states she had a right subcortical small vessel CVA in 2011.  No past medical history on file.  History  Substance Use Topics  . Smoking status: Former Smoker -- 1.0 packs/day for 3 years    Types: Cigarettes    Quit date: 02/08/1979  . Smokeless tobacco: Never Used  . Alcohol Use: Not on file    No family history on file.  Allergies  Allergen Reactions  . Prinivil (Lisinopril) Swelling    Lip swelling  . Plavix (Clopidogrel Bisulfate) Other (See Comments)    Shakes and teeth chattering, "just didn't feel right"  . Sulfa Antibiotics Hives    Current outpatient prescriptions:aspirin 81 MG tablet, Take 81 mg by mouth daily., Disp: , Rfl: ;  cyclobenzaprine (FLEXERIL) 5 MG tablet, Take 5 mg by mouth daily., Disp: , Rfl: ;  HYDROcodone-acetaminophen (NORCO/VICODIN) 5-325 MG per tablet, Take 1 tablet by mouth as needed., Disp: , Rfl: ;  LORazepam (ATIVAN) 1 MG tablet, Take 1 mg by mouth daily., Disp: , Rfl: ;  losartan (COZAAR) 100 MG tablet, Take 100 mg by mouth daily., Disp: , Rfl:  LYRICA 50 MG capsule, Take 50 mg by mouth daily. Max 3 tabs q day, Disp: , Rfl: ;  meloxicam (MOBIC) 7.5 MG tablet, Take 7.5 mg by mouth daily., Disp: , Rfl: ;  Multiple Vitamin (MULTIVITAMIN) tablet,  Take 1 tablet by mouth daily., Disp: , Rfl: ;  Omega-3 Fatty Acids (FISH OIL PO), Take 3,000 mg by mouth daily., Disp: , Rfl: ;  omeprazole (PRILOSEC) 20 MG capsule, Take 20 mg by mouth daily., Disp: , Rfl:  simvastatin (ZOCOR) 40 MG tablet, Take 40 mg by mouth daily., Disp: , Rfl: ;  triamterene-hydrochlorothiazide (MAXZIDE-25) 37.5-25 MG per tablet, Take by mouth daily. 1/2 tablet, Disp: , Rfl: ;  VOLTAREN 1 % GEL, Apply 2 g topically as needed., Disp: , Rfl: ;  zolpidem (AMBIEN) 10 MG tablet, Take 10 mg by mouth at bedtime as needed., Disp: , Rfl:   BP 150/62  Pulse 116  Resp 18  Ht 4' 10.5" (1.486 m)  Wt 155 lb (70.308 kg)  BMI 31.84 kg/m2  Body mass index is 31.84 kg/(m^2).           Review of Systems complains of back and neck discomfort as well as pain down the left lower extremity. Also complains of weakness in the arms and legs recent onset of swelling in the left upper extremity with a negative Doppler exam revealing no DVT. Does have a history of lower extremity DVT in the past is not currently on Coumadin. Other systems negative and complete review of systems    Objective:   Physical Exam blood pressure 150/62 heart rate 116 respirations 18 Gen.-alert and oriented x3 in no apparent distress HEENT  normal for age Lungs no rhonchi or wheezing Cardiovascular regular rhythm no murmurs carotid pulses 3+ palpable -soft bruit on right Abdomen soft nontender no palpable masses Musculoskeletal free of  major deformities Skin clear -no rashes Neurologic normal Lower extremities 3+ femoral and dorsalis pedis pulses palpable bilaterally with no edema No obvious upper extremity edema on today's exam in the forearm or upper arm  Today I ordered a carotid duplex exam which I reviewed and interpreted. She has an approximate 60-70% right ICA stenosis and a minimal left ICA stenosis.      Assessment:     Asymptomatic moderate right ICA stenosis with remote history of right brain  CVA No evidence of upper extremity DVT on recent duplex scan at [facility] resolving    Plan:     Will follow her right carotid disease with annual duplex scanning. If she develops any neurologic symptoms she will be in touch with [place]. Otherwise we'll see in one year with followup duplex scan in our office

## 2012-10-03 NOTE — Addendum Note (Signed)
Addended by: Sharee Pimple on: 10/03/2012 03:08 PM   Modules accepted: Orders

## 2012-10-10 ENCOUNTER — Other Ambulatory Visit: Payer: Medicare Other

## 2012-10-10 ENCOUNTER — Encounter: Payer: Medicare Other | Admitting: Vascular Surgery

## 2012-12-05 ENCOUNTER — Ambulatory Visit
Admission: RE | Admit: 2012-12-05 | Discharge: 2012-12-05 | Disposition: A | Discharge status: Home / Self Care | Payer: Medicare Other | Source: Ambulatory Visit | Attending: Family Medicine | Admitting: Family Medicine

## 2012-12-05 ENCOUNTER — Other Ambulatory Visit: Payer: Self-pay | Admitting: Family Medicine

## 2012-12-05 DIAGNOSIS — M25519 Pain in unspecified shoulder: Secondary | ICD-10-CM

## 2013-03-29 ENCOUNTER — Other Ambulatory Visit: Payer: Self-pay | Admitting: Family Medicine

## 2013-04-04 ENCOUNTER — Ambulatory Visit
Admission: RE | Admit: 2013-04-04 | Discharge: 2013-04-04 | Disposition: A | Discharge status: Home / Self Care | Payer: Medicare Other | Source: Ambulatory Visit | Attending: Family Medicine | Admitting: Family Medicine

## 2013-04-04 ENCOUNTER — Other Ambulatory Visit: Payer: Self-pay | Admitting: Family Medicine

## 2013-04-04 MED ORDER — IOHEXOL 180 MG/ML  SOLN: 1.0000 mL | Freq: Once | INTRAMUSCULAR | Status: AC | PRN

## 2013-04-04 MED ORDER — METHYLPREDNISOLONE ACETATE 40 MG/ML INJ SUSP (RADIOLOG: 120.0000 mg | Freq: Once | INTRAMUSCULAR | Status: DC

## 2013-07-29 ENCOUNTER — Other Ambulatory Visit: Payer: Self-pay | Admitting: Family Medicine

## 2013-07-29 DIAGNOSIS — M5416 Radiculopathy, lumbar region: Secondary | ICD-10-CM

## 2013-08-14 ENCOUNTER — Ambulatory Visit
Admission: RE | Admit: 2013-08-14 | Discharge: 2013-08-14 | Disposition: A | Discharge status: Home / Self Care | Payer: Medicare Other | Source: Ambulatory Visit | Attending: Family Medicine | Admitting: Family Medicine

## 2013-08-14 DIAGNOSIS — M5416 Radiculopathy, lumbar region: Secondary | ICD-10-CM

## 2013-08-14 MED ORDER — METHYLPREDNISOLONE ACETATE 40 MG/ML INJ SUSP (RADIOLOG
120.0000 mg | Freq: Once | INTRAMUSCULAR | Status: AC
  Administered 2013-08-14: 120 mg via EPIDURAL

## 2013-08-14 MED ORDER — IOHEXOL 180 MG/ML  SOLN
1.0000 mL | Freq: Once | INTRAMUSCULAR | Status: AC | PRN
  Administered 2013-08-14: 1 mL via EPIDURAL

## 2013-09-14 ENCOUNTER — Other Ambulatory Visit: Payer: Self-pay | Admitting: Family Medicine

## 2013-09-14 DIAGNOSIS — M5416 Radiculopathy, lumbar region: Secondary | ICD-10-CM

## 2013-09-26 ENCOUNTER — Other Ambulatory Visit: Payer: Self-pay | Admitting: Family Medicine

## 2013-09-26 ENCOUNTER — Ambulatory Visit
Admission: RE | Admit: 2013-09-26 | Discharge: 2013-09-26 | Disposition: A | Discharge status: Home / Self Care | Payer: Medicare Other | Source: Ambulatory Visit | Attending: Family Medicine | Admitting: Family Medicine

## 2013-09-26 DIAGNOSIS — M5416 Radiculopathy, lumbar region: Secondary | ICD-10-CM

## 2013-09-26 MED ORDER — METHYLPREDNISOLONE ACETATE 40 MG/ML INJ SUSP (RADIOLOG
120.0000 mg | Freq: Once | INTRAMUSCULAR | Status: AC
  Administered 2013-09-26: 120 mg via EPIDURAL

## 2013-09-26 MED ORDER — IOHEXOL 180 MG/ML  SOLN
1.0000 mL | Freq: Once | INTRAMUSCULAR | Status: AC | PRN
  Administered 2013-09-26: 1 mL via EPIDURAL

## 2013-10-05 ENCOUNTER — Encounter: Payer: Self-pay | Admitting: Family

## 2013-10-08 ENCOUNTER — Encounter: Payer: Self-pay | Admitting: Family

## 2013-10-08 ENCOUNTER — Ambulatory Visit (HOSPITAL_COMMUNITY)
Admission: RE | Admit: 2013-10-08 | Discharge: 2013-10-08 | Disposition: A | Discharge status: Home / Self Care | Payer: Medicare Other | Source: Ambulatory Visit | Attending: Family | Admitting: Family

## 2013-10-08 ENCOUNTER — Encounter (INDEPENDENT_AMBULATORY_CARE_PROVIDER_SITE_OTHER): Payer: Self-pay

## 2013-10-08 ENCOUNTER — Ambulatory Visit (INDEPENDENT_AMBULATORY_CARE_PROVIDER_SITE_OTHER): Payer: Medicare Other | Admitting: Family

## 2013-10-08 VITALS — BP 138/82 | Ht <= 58 in | Wt 161.4 lb

## 2013-10-08 DIAGNOSIS — I6529 Occlusion and stenosis of unspecified carotid artery: Secondary | ICD-10-CM | POA: Insufficient documentation

## 2013-10-08 DIAGNOSIS — I658 Occlusion and stenosis of other precerebral arteries: Secondary | ICD-10-CM

## 2013-10-08 DIAGNOSIS — I6523 Occlusion and stenosis of bilateral carotid arteries: Secondary | ICD-10-CM

## 2013-10-08 DIAGNOSIS — Z48812 Encounter for surgical aftercare following surgery on the circulatory system: Secondary | ICD-10-CM

## 2013-10-08 DIAGNOSIS — I63239 Cerebral infarction due to unspecified occlusion or stenosis of unspecified carotid arteries: Secondary | ICD-10-CM

## 2013-10-08 NOTE — Progress Notes (Signed)
Established Carotid Patient  Previous Carotid surgery: No  History of Present Illness  [name] is a 77 y.o. female patient of Dr. Hart Rochester who has known carotid stenosis.She had an episode 6 years ago with some weakness on the left side. She had severe shingles involving the left thigh at that time. She also had severe back problems which required outpatient vancomycin because of infection. She has had multiple operations on her lumbar and cervical spine and continues to have some discomfort in the left leg related to this. Medical record states she had a right subcortical small vessel CVA in 2011. Patient recalls having a stroke in 2011 as manifested by bilateral legs weakness and CT of head results; during that hospitalization she also had severe shingle on the left side of her body and a DVT. Had physical therapy for leg strengthening and is walking independently with mild weakness in left leg and post-herpetic pain in left leg. Patient reports bilateral vision diplopia in 2011 but had exercises to improve this.  The patient  denies facial drooping.  The patient reports temporary expressive aphasia which has resolved.   Patient denies cardiac problems.   Patient denies New Medical or Surgical History.  Pt Diabetic: Yes, diet controlled. Pt smoker: non-smoker  Pt meds include: Statin : Yes ASA: Yes Other anticoagulants/antiplatelets: no   Past Medical History  Diagnosis Date  . Hyperlipidemia   . Osteoarthritis of cervical spine   . History of shingles 11-2008  . Stroke 12-2009  . Carotid artery occlusion   . Kidney stones 2012  . Hypertension   . Allergy     Social History History  Substance Use Topics  . Smoking status: Former Smoker -- 1.00 packs/day for 3 years    Types: Cigarettes    Quit date: 02/08/1979  . Smokeless tobacco: Never Used  . Alcohol Use: No    Family History History reviewed. No pertinent family history.  Surgical History Past Surgical  History  Procedure Laterality Date  . Hernia repair  1963    left side  . Abdominal hysterectomy  1973  . Lumbar disc surgery  1975  . Gallbladder surgery  1988    large stone blockage  . Ankle reconstruction  1990    right - 5 screws & plate  . Rotator cuff repair  1994    right  . Spine surgery  2000    upper fuse 4&5  . Stomach surgery  2002    mass rt & left  . Spine surgery  2003    upper fuse 6&7  . Foot fusion  2005    left - 4 screws  . Spine surgery  2010    replace broken plate  . Cholecystectomy    . Spinal fusion surgery  2000    C4 and C5  . Spinal fusion surgery  2003, 2010    C6 and C7    Allergies  Allergen Reactions  . Prinivil [Lisinopril] Swelling    Lip swelling  . Plavix [Clopidogrel Bisulfate] Other (See Comments)    Shakes and teeth chattering, "just didn't feel right"  . Sulfa Antibiotics Hives    Current Outpatient Prescriptions  Medication Sig Dispense Refill  . aspirin 81 MG tablet Take 81 mg by mouth daily. Per patient, takes one 81 mg tablet in the morning and then takes four 81 mg tablets 1 hour before bedtime.      . Coenzyme Q10 (CO Q 10) 100 MG CAPS Take 100  mg by mouth daily. Takes two 100 mg tablets daily.      Marland Kitchen LORazepam (ATIVAN) 1 MG tablet Take 1 mg by mouth daily.      Marland Kitchen losartan (COZAAR) 100 MG tablet Take 100 mg by mouth daily.      Marland Kitchen lovastatin (MEVACOR) 40 MG tablet Take 40 mg by mouth daily after supper.      Marland Kitchen LYRICA 50 MG capsule Take 50 mg by mouth daily. Max 3 tabs q day      . meloxicam (MOBIC) 7.5 MG tablet Take 7.5 mg by mouth daily.      . Multiple Vitamin (MULTIVITAMIN) tablet Take 1 tablet by mouth daily.      . niacin 500 MG tablet Take 500 mg by mouth at bedtime.      . Omega-3 Fatty Acids (FISH OIL PO) Take 3,000 mg by mouth daily.      Marland Kitchen omeprazole (PRILOSEC) 20 MG capsule Take 20 mg by mouth daily.      . VOLTAREN 1 % GEL Apply 2 g topically as needed.      . zolpidem (AMBIEN) 10 MG tablet Take 10 mg by  mouth at bedtime as needed.      . cyclobenzaprine (FLEXERIL) 5 MG tablet Take 5 mg by mouth daily.      Marland Kitchen HYDROcodone-acetaminophen (NORCO/VICODIN) 5-325 MG per tablet Take 1 tablet by mouth as needed.      . simvastatin (ZOCOR) 40 MG tablet Take 40 mg by mouth daily.      Marland Kitchen triamterene-hydrochlorothiazide (MAXZIDE-25) 37.5-25 MG per tablet Take by mouth daily. 1/2 tablet       No current facility-administered medications for this visit.    Review of Systems : [x]  Positive   [ ]  Denies  General:[ ]  Weight loss,  [ ]  Weight gain, [ ]  Loss of appetite, [ ]  Fever, [ ]  chills  Neurologic: [ ]  Dizziness, [ ]  Blackouts, [ ]  Headaches, [ ]  Seizure [ ]  Stroke, [ ]  "Mini stroke", [ ]  Slurred speech, [ ]  Temporary blindness;  [ ] weakness,  Ear/Nose/Throat: [ ]  Change in hearing, [ ]  Nose bleeds, [ ]  Hoarseness  Vascular:[ ]  Pain in legs with walking, [ ]  Pain in feet while lying flat , [ ]   Non-healing ulcer, [ ]  Blood clot in vein,    Pulmonary: [ ]  Home oxygen, [ ]   Productive cough, [ ]  Bronchitis, [ ]  Coughing up blood,  [ ]  Asthma, [ ]  Wheezing  Musculoskeletal:  [ ]  Arthritis, [ ]  Joint pain, [ ]  low back pain  Cardiac: [ ]  Chest pain, [ ]  Shortness of breath when lying flat, [ ]  Shortness of breath with exertion, [ ]  Palpitations, [ ]  Heart murmur, [ ]   Atrial fibrillation  Hematologic:[ ]  Easy Bruising, [ ]  Anemia; [ ]  Hepatitis  Psychiatric: [ ]   Depression, [ ]  Anxiety   Gastrointestinal: [ ]  Black stool, [ ]  Blood in stool, [ ]  Peptic ulcer disease,  [ ]  Gastroesophageal Reflux, [ ]  Trouble swallowing, [ ]  Diarrhea, [ ]  Constipation  Urinary: [ ]  chronic Kidney disease, [ ]  on HD, [ ]  Burning with urination, [ ]  Frequent urination, [ ]  Difficulty urinating;   Skin: [ ]  Rashes, [ ]  Wounds    Physical Examination  Filed Vitals:   10/08/13 1509  BP: 138/82   Filed Weights   10/08/13 1509  Weight: 161 lb 6.4 oz (73.211 kg)   Body mass index is 33.74 kg/(m^2).  General:  WDWN obese female in NAD GAIT: normal Eyes: PERRLA Pulmonary:  CTAB, Negative  Rales, Negative rhonchi, & Negative wheezing.  Cardiac: regular Rhythm ,  Negative Murmurs.  VASCULAR EXAM Carotid Bruits Left Right   Negative Negative    Aorta is not palpable. Radial pulses are 2+ palpable and  equal.                                                                                                                            LE Pulses LEFT RIGHT       POPLITEAL  not palpable   not palpable       POSTERIOR TIBIAL  not palpable   not palpable        DORSALIS PEDIS      ANTERIOR TIBIAL  palpable  palpable     Gastrointestinal: soft, nontender, BS WNL, no r/g,  negative masses.  Musculoskeletal: Negative muscle atrophy/wasting. M/S 5/5 throughout, Extremities without ischemic changes.  Neurologic: A&O X 3; Appropriate Affect ; SENSATION ;normal;  Speech is normal CN 2-12 intact except, Pain and light touch intact in extremities, Motor exam as listed above.   Non-Invasive Vascular Imaging CAROTID DUPLEX 10/08/2013   Right ICA: 40 - 59 % stenosis. Left ICA: <40% stenosis.  Previous carotid studies demonstrated: RICA 60 - 79 % stenosis, LICA <40% stenosis.  These findings are Improved from previous exam.  Assessment: AVA TANGNEY is a 77 y.o. female who presents with asymptomatic 40 - 59 % Right ICA  Stenosis and <40% left ICA stenosis. The  ICA stenosis is  Improved from previous exam.  Plan: Follow-up in 1 year with Carotid Duplex scan.   I discussed in depth with the patient the nature of atherosclerosis, and emphasized the importance of maximal medical management including strict control of blood pressure, blood glucose, and lipid levels, obtaining regular exercise, and continued cessation of smoking.  The patient is aware that without maximal medical management the underlying atherosclerotic disease process will progress, limiting the benefit of any interventions. The  patient was given information about stroke prevention and what symptoms should prompt the patient to seek immediate medical care. Thank you for allowing [place] to participate in this patient's care.  Charisse March, RN, MSN, FNP-C [facility] Office: [phone]  Clinic Physician: Myra Gianotti  10/08/2013 3:43 PM

## 2013-10-08 NOTE — Patient Instructions (Signed)
Stroke Prevention Some medical conditions and behaviors are associated with an increased chance of having a stroke. You may prevent a stroke by making healthy choices and managing medical conditions. Reduce your risk of having a stroke by:  Staying physically active. Get at least 30 minutes of activity on most or all days.  Not smoking. It may also be helpful to avoid exposure to secondhand smoke.  Limiting alcohol use. Moderate alcohol use is considered to be:  No more than 2 drinks per day for men.  No more than 1 drink per day for nonpregnant women.  Eating healthy foods.  Include 5 or more servings of fruits and vegetables a day.  Certain diets may be prescribed to address high blood pressure, high cholesterol, diabetes, or obesity.  Managing your cholesterol levels.  A low-saturated fat, low-trans fat, low-cholesterol, and high-fiber diet may control cholesterol levels.  Take any prescribed medicines to control cholesterol as directed by your caregiver.  Managing your diabetes.  A controlled-carbohydrate, controlled-sugar diet is recommended to manage diabetes.  Take any prescribed medicines to control diabetes as directed by your caregiver.  Controlling your high blood pressure (hypertension).  A low-salt (sodium), low-saturated fat, low-trans fat, and low-cholesterol diet is recommended to manage high blood pressure.  Take any prescribed medicines to control hypertension as directed by your caregiver.  Maintaining a healthy weight.  A reduced-calorie, low-sodium, low-saturated fat, low-trans fat, low-cholesterol diet is recommended to manage weight.  Stopping drug abuse.  Avoiding birth control pills.  Talk to your caregiver about the risks of taking birth control pills if you are over 35 years old, smoke, get migraines, or have ever had a blood clot.  Getting evaluated for sleep disorders (sleep apnea).  Talk to your caregiver about getting a sleep evaluation  if you snore a lot or have excessive sleepiness.  Taking medicines as directed by your caregiver.  For some people, aspirin or blood thinners (anticoagulants) are helpful in reducing the risk of forming abnormal blood clots that can lead to stroke. If you have the irregular heart rhythm of atrial fibrillation, you should be on a blood thinner unless there is a good reason you cannot take them.  Understand all your medicine instructions. SEEK IMMEDIATE MEDICAL CARE IF:   You have sudden weakness or numbness of the face, arm, or leg, especially on one side of the body.  You have sudden confusion.  You have trouble speaking (aphasia) or understanding.  You have sudden trouble seeing in one or both eyes.  You have sudden trouble walking.  You have dizziness.  You have a loss of balance or coordination.  You have a sudden, severe headache with no known cause.  You have new chest pain or an irregular heartbeat. Any of these symptoms may represent a serious problem that is an emergency. Do not wait to see if the symptoms will go away. Get medical help right away. Call your local emergency services (911 in [place] Do not drive yourself to the hospital. Document Released: 01/20/2005 Document Revised: 03/06/2012 Document Reviewed: 08/02/2011 Exit[employer] Patient Information 2014 [employer].  

## 2013-10-09 ENCOUNTER — Ambulatory Visit: Payer: Medicare Other | Admitting: Neurosurgery

## 2013-10-09 ENCOUNTER — Other Ambulatory Visit: Payer: Medicare Other

## 2013-10-09 NOTE — Addendum Note (Signed)
Addended by: Sharee Pimple on: 10/09/2013 08:34 AM   Modules accepted: Orders

## 2013-12-24 ENCOUNTER — Other Ambulatory Visit: Payer: Self-pay | Admitting: Family Medicine

## 2013-12-24 DIAGNOSIS — M5416 Radiculopathy, lumbar region: Secondary | ICD-10-CM

## 2013-12-26 ENCOUNTER — Other Ambulatory Visit: Payer: Self-pay | Admitting: Family Medicine

## 2013-12-26 ENCOUNTER — Ambulatory Visit
Admission: RE | Admit: 2013-12-26 | Discharge: 2013-12-26 | Disposition: A | Discharge status: Home / Self Care | Payer: Medicare Other | Source: Ambulatory Visit | Attending: Family Medicine | Admitting: Family Medicine

## 2013-12-26 DIAGNOSIS — M5416 Radiculopathy, lumbar region: Secondary | ICD-10-CM

## 2013-12-26 MED ORDER — IOHEXOL 180 MG/ML  SOLN
1.0000 mL | Freq: Once | INTRAMUSCULAR | Status: AC | PRN
  Administered 2013-12-26: 1 mL via EPIDURAL

## 2013-12-26 MED ORDER — METHYLPREDNISOLONE ACETATE 40 MG/ML INJ SUSP (RADIOLOG
120.0000 mg | Freq: Once | INTRAMUSCULAR | Status: AC
  Administered 2013-12-26: 120 mg via EPIDURAL

## 2014-05-14 ENCOUNTER — Other Ambulatory Visit: Payer: Self-pay | Admitting: Family Medicine

## 2014-05-14 DIAGNOSIS — M545 Low back pain, unspecified: Secondary | ICD-10-CM

## 2014-05-14 DIAGNOSIS — G8929 Other chronic pain: Secondary | ICD-10-CM

## 2014-05-17 ENCOUNTER — Other Ambulatory Visit: Payer: Medicare Other | Admitting: Family Medicine

## 2014-05-17 ENCOUNTER — Ambulatory Visit
Admission: RE | Admit: 2014-05-17 | Discharge: 2014-05-17 | Disposition: A | Discharge status: Home / Self Care | Payer: Medicare Other | Source: Ambulatory Visit | Attending: Family Medicine | Admitting: Family Medicine

## 2014-05-17 DIAGNOSIS — G8929 Other chronic pain: Secondary | ICD-10-CM

## 2014-05-17 DIAGNOSIS — M545 Low back pain, unspecified: Secondary | ICD-10-CM

## 2014-05-17 MED ORDER — METHYLPREDNISOLONE ACETATE 40 MG/ML INJ SUSP (RADIOLOG
120.0000 mg | Freq: Once | INTRAMUSCULAR | Status: AC
  Administered 2014-05-17: 120 mg via EPIDURAL

## 2014-05-17 MED ORDER — IOHEXOL 180 MG/ML  SOLN
1.0000 mL | Freq: Once | INTRAMUSCULAR | Status: AC | PRN
  Administered 2014-05-17: 1 mL via EPIDURAL

## 2014-07-23 ENCOUNTER — Other Ambulatory Visit: Payer: Self-pay | Admitting: Family Medicine

## 2014-07-23 ENCOUNTER — Ambulatory Visit
Admission: RE | Admit: 2014-07-23 | Discharge: 2014-07-23 | Disposition: A | Discharge status: Home / Self Care | Payer: Medicare Other | Source: Ambulatory Visit | Attending: Family Medicine | Admitting: Family Medicine

## 2014-07-23 DIAGNOSIS — G8929 Other chronic pain: Secondary | ICD-10-CM

## 2014-07-26 ENCOUNTER — Other Ambulatory Visit: Payer: Self-pay | Admitting: Family Medicine

## 2014-07-26 DIAGNOSIS — G8929 Other chronic pain: Secondary | ICD-10-CM

## 2014-07-26 DIAGNOSIS — M545 Low back pain, unspecified: Secondary | ICD-10-CM

## 2014-08-09 ENCOUNTER — Ambulatory Visit
Admission: RE | Admit: 2014-08-09 | Discharge: 2014-08-09 | Disposition: A | Discharge status: Home / Self Care | Payer: Medicare Other | Source: Ambulatory Visit | Attending: Family Medicine | Admitting: Family Medicine

## 2014-08-09 ENCOUNTER — Other Ambulatory Visit: Payer: Self-pay | Admitting: Family Medicine

## 2014-08-09 DIAGNOSIS — G8929 Other chronic pain: Secondary | ICD-10-CM

## 2014-08-09 DIAGNOSIS — M545 Low back pain, unspecified: Secondary | ICD-10-CM

## 2014-08-09 MED ORDER — IOHEXOL 180 MG/ML  SOLN
1.0000 mL | Freq: Once | INTRAMUSCULAR | Status: AC | PRN
  Administered 2014-08-09: 1 mL via INTRAVENOUS

## 2014-08-09 MED ORDER — METHYLPREDNISOLONE ACETATE 40 MG/ML INJ SUSP (RADIOLOG
120.0000 mg | Freq: Once | INTRAMUSCULAR | Status: AC
  Administered 2014-08-09: 120 mg via EPIDURAL

## 2014-09-04 ENCOUNTER — Other Ambulatory Visit: Payer: Self-pay | Admitting: Family Medicine

## 2014-09-04 DIAGNOSIS — M5136 Other intervertebral disc degeneration, lumbar region: Secondary | ICD-10-CM

## 2014-09-17 ENCOUNTER — Ambulatory Visit
Admission: RE | Admit: 2014-09-17 | Discharge: 2014-09-17 | Disposition: A | Discharge status: Home / Self Care | Payer: Medicare Other | Source: Ambulatory Visit | Attending: Family Medicine | Admitting: Family Medicine

## 2014-09-17 DIAGNOSIS — M5136 Other intervertebral disc degeneration, lumbar region: Secondary | ICD-10-CM

## 2014-09-17 MED ORDER — IOHEXOL 180 MG/ML  SOLN
1.0000 mL | Freq: Once | INTRAMUSCULAR | Status: AC | PRN
  Administered 2014-09-17: 1 mL via EPIDURAL

## 2014-09-17 MED ORDER — METHYLPREDNISOLONE ACETATE 40 MG/ML INJ SUSP (RADIOLOG
120.0000 mg | Freq: Once | INTRAMUSCULAR | Status: AC
  Administered 2014-09-17: 120 mg via EPIDURAL

## 2014-10-15 ENCOUNTER — Encounter: Payer: Self-pay | Admitting: Family

## 2014-10-16 ENCOUNTER — Encounter: Payer: Self-pay | Admitting: Family

## 2014-10-16 ENCOUNTER — Ambulatory Visit (INDEPENDENT_AMBULATORY_CARE_PROVIDER_SITE_OTHER): Payer: Medicare Other | Admitting: Family

## 2014-10-16 ENCOUNTER — Ambulatory Visit (HOSPITAL_COMMUNITY)
Admission: RE | Admit: 2014-10-16 | Discharge: 2014-10-16 | Disposition: A | Discharge status: Home / Self Care | Payer: Medicare Other | Source: Ambulatory Visit | Attending: Family | Admitting: Family

## 2014-10-16 VITALS — BP 178/81 | HR 72 | Resp 16 | Ht 58.5 in | Wt 150.0 lb

## 2014-10-16 DIAGNOSIS — Z48812 Encounter for surgical aftercare following surgery on the circulatory system: Secondary | ICD-10-CM

## 2014-10-16 DIAGNOSIS — I6523 Occlusion and stenosis of bilateral carotid arteries: Secondary | ICD-10-CM | POA: Insufficient documentation

## 2014-10-16 NOTE — Addendum Note (Signed)
Addended by: Mena Goes on: 10/16/2014 02:20 PM   Modules accepted: Orders

## 2014-10-16 NOTE — Patient Instructions (Signed)
Stroke Prevention Some medical conditions and behaviors are associated with an increased chance of having a stroke. You may prevent a stroke by making healthy choices and managing medical conditions. HOW CAN I REDUCE MY RISK OF HAVING A STROKE?   Stay physically active. Get at least 30 minutes of activity on most or all days.  Do not smoke. It may also be helpful to avoid exposure to secondhand smoke.  Limit alcohol use. Moderate alcohol use is considered to be:  No more than 2 drinks per day for men.  No more than 1 drink per day for nonpregnant women.  Eat healthy foods. This involves:  Eating 5 or more servings of fruits and vegetables a day.  Making dietary changes that address high blood pressure (hypertension), high cholesterol, diabetes, or obesity.  Manage your cholesterol levels.  Making food choices that are high in fiber and low in saturated fat, trans fat, and cholesterol may control cholesterol levels.  Take any prescribed medicines to control cholesterol as directed by your health care provider.  Manage your diabetes.  Controlling your carbohydrate and sugar intake is recommended to manage diabetes.  Take any prescribed medicines to control diabetes as directed by your health care provider.  Control your hypertension.  Making food choices that are low in salt (sodium), saturated fat, trans fat, and cholesterol is recommended to manage hypertension.  Take any prescribed medicines to control hypertension as directed by your health care provider.  Maintain a healthy weight.  Reducing calorie intake and making food choices that are low in sodium, saturated fat, trans fat, and cholesterol are recommended to manage weight.  Stop drug abuse.  Avoid taking birth control pills.  Talk to your health care provider about the risks of taking birth control pills if you are over 35 years old, smoke, get migraines, or have ever had a blood clot.  Get evaluated for sleep  disorders (sleep apnea).  Talk to your health care provider about getting a sleep evaluation if you snore a lot or have excessive sleepiness.  Take medicines only as directed by your health care provider.  For some people, aspirin or blood thinners (anticoagulants) are helpful in reducing the risk of forming abnormal blood clots that can lead to stroke. If you have the irregular heart rhythm of atrial fibrillation, you should be on a blood thinner unless there is a good reason you cannot take them.  Understand all your medicine instructions.  Make sure that other conditions (such as anemia or atherosclerosis) are addressed. SEEK IMMEDIATE MEDICAL CARE IF:   You have sudden weakness or numbness of the face, arm, or leg, especially on one side of the body.  Your face or eyelid droops to one side.  You have sudden confusion.  You have trouble speaking (aphasia) or understanding.  You have sudden trouble seeing in one or both eyes.  You have sudden trouble walking.  You have dizziness.  You have a loss of balance or coordination.  You have a sudden, severe headache with no known cause.  You have new chest pain or an irregular heartbeat. Any of these symptoms may represent a serious problem that is an emergency. Do not wait to see if the symptoms will go away. Get medical help at once. Call your local emergency services (911 in [place] Do not drive yourself to the hospital. Document Released: 01/20/2005 Document Revised: 04/29/2014 Document Reviewed: 06/15/2013 Exit[employer] Patient Information 2015 [employer]. This information is not intended to replace advice given   to you by your health care provider. Make sure you discuss any questions you have with your health care provider.  

## 2014-10-16 NOTE — Progress Notes (Signed)
Established Carotid Patient   History of Present Illness  [name] is a 78 y.o. female patient of Dr. Kellie Simmering who has known carotid artery stenosis. She had severe back problems in 2011 which required outpatient vancomycin because of infection. She has had multiple operations on her lumbar and cervical spine and continues to have some discomfort in the left leg related to this. Medical record indicates that she had a right subcortical small vessel CVA in 2011.  Patient recalls having a stroke in 2011 as manifested by bilateral legs weakness; during that hospitalization she also had severe shingles on the left side of her body and a DVT. The patient denies any further stroke or TIA symptoms. The patient had physical therapy for leg strengthening and is walking independently with mild weakness in left leg and post-herpetic pain in left leg.  Patient reports bilateral vision diplopia in 2011 but had exercises to improve this.  The patient denies unilateral facial drooping.  The patient reports temporary expressive aphasia which has resolved.  Patient denies cardiac problems.   Patient reports New Medical or Surgical History: 3 ESI's in the last year.   Pt Diabetic: Yes, diet controlled.  Pt smoker: non-smoker   Pt meds include:  Statin : No, caused severe myalgias ASA: Yes  Other anticoagulants/antiplatelets: no   Past Medical History  Diagnosis Date  . Hyperlipidemia   . Osteoarthritis of cervical spine   . History of shingles 11-2008  . Stroke 12-2009  . Carotid artery occlusion   . Kidney stones 2012  . Hypertension   . Allergy     Social History History  Substance Use Topics  . Smoking status: Former Smoker -- 1.00 packs/day for 3 years    Types: Cigarettes    Quit date: 02/08/1979  . Smokeless tobacco: Never Used  . Alcohol Use: No    Family History Family History  Problem Relation Age of Onset  . Dementia Mother   . Heart attack Father     Surgical  History Past Surgical History  Procedure Laterality Date  . Hernia repair  1963    left side  . Abdominal hysterectomy  1973  . Lumbar disc surgery  1975  . Gallbladder surgery  1988    large stone blockage  . Ankle reconstruction  1990    right - 5 screws & plate  . Rotator cuff repair  1994    right  . Spine surgery  2000    C4-C5 fusion  . Stomach surgery  2002    mass rt & left (not cancerous)  . Spine surgery  2003    C6-C7 fusion  . Foot fusion  2005    left - 4 screws  . Spine surgery  2010    replace broken plate (cervical region)  . Spinal fusion surgery  2000    C4 and C5  . Spinal fusion surgery  2003, 2010    C6 and C7  . Cholecystectomy  1988  . Lumbar epidural injection  May-Aug-Sept. 2015    Allergies  Allergen Reactions  . Plavix [Clopidogrel Bisulfate] Other (See Comments)    Shakes and teeth chattering, "just didn't feel right"  . Prinivil [Lisinopril] Swelling    Lip swelling  . Sulfa Antibiotics Hives    Current Outpatient Prescriptions  Medication Sig Dispense Refill  . aspirin 81 MG tablet Take 81 mg by mouth daily. Per patient, takes one 81 mg tablet in the morning and then takes four 81 mg  tablets 1 hour before bedtime.      . calcium carbonate (OS-CAL) 600 MG TABS tablet Take 600 mg by mouth 2 (two) times daily with a meal.      . Coenzyme Q10 (CO Q 10) 100 MG CAPS Take 100 mg by mouth daily. Takes two 100 mg tablets daily.      . colestipol (COLESTID) 1 G tablet Take 1 g by mouth 2 (two) times daily.      . cyclobenzaprine (FLEXERIL) 5 MG tablet Take 5 mg by mouth daily.      Marland Kitchen doxazosin (CARDURA) 1 MG tablet Take 1 mg by mouth at bedtime.      Marland Kitchen HYDROcodone-acetaminophen (NORCO/VICODIN) 5-325 MG per tablet Take 1 tablet by mouth as needed.      Marland Kitchen LORazepam (ATIVAN) 1 MG tablet Take 1 mg by mouth daily.      Marland Kitchen losartan (COZAAR) 100 MG tablet Take 100 mg by mouth daily.      Marland Kitchen lovastatin (MEVACOR) 40 MG tablet Take 40 mg by mouth daily after  supper.      Marland Kitchen LYRICA 50 MG capsule Take 50 mg by mouth daily. Max 3 tabs q day      . meloxicam (MOBIC) 7.5 MG tablet Take 7.5 mg by mouth daily.      . Multiple Vitamin (MULTIVITAMIN) tablet Take 1 tablet by mouth daily.      . niacin 500 MG tablet Take 500 mg by mouth at bedtime.      . Omega-3 Fatty Acids (FISH OIL PO) Take 3,000 mg by mouth daily.      Marland Kitchen omeprazole (PRILOSEC) 20 MG capsule Take 20 mg by mouth daily.      . simvastatin (ZOCOR) 40 MG tablet Take 40 mg by mouth daily.      Marland Kitchen triamterene-hydrochlorothiazide (MAXZIDE-25) 37.5-25 MG per tablet Take by mouth daily. 1/2 tablet      . VOLTAREN 1 % GEL Apply 2 g topically as needed.      . zolpidem (AMBIEN) 10 MG tablet Take 10 mg by mouth at bedtime as needed.       No current facility-administered medications for this visit.    Review of Systems : See HPI for pertinent positives and negatives.  Physical Examination  Filed Vitals:   10/16/14 1158 10/16/14 1202  BP: 184/76 178/81  Pulse: 70 72  Resp:  16  Height:  4' 10.5" (1.486 m)  Weight:  150 lb (68.04 kg)  SpO2:  99%   Body mass index is 30.81 kg/(m^2).  General: WDWN obese female in NAD  GAIT: normal  Eyes: PERRLA  Pulmonary: CTAB, Negative Rales, Negative rhonchi, & Negative wheezing.  Cardiac: regular Rhythm, positive Murmur.   VASCULAR EXAM  Carotid Bruits  Left  Right    Negative  Negative    Aorta is not palpable.  Radial pulses are 2+ palpable and equal.   LE Pulses  LEFT  RIGHT   POPLITEAL  not palpable  not palpable   POSTERIOR TIBIAL  not palpable  not palpable   DORSALIS PEDIS  ANTERIOR TIBIAL  Faintly palpable  Faintly palpable    Gastrointestinal: soft, nontender, BS WNL, no r/g, no palpated masses.  Musculoskeletal: Negative muscle atrophy/wasting. M/S 5/5 throughout, Extremities without ischemic changes.  Neurologic: A&O X 3; Appropriate Affect ; SENSATION ;normal;  Speech is normal  CN 2-12 intact except, Pain and light touch  intact in extremities, Motor exam as listed above.   Non-Invasive Vascular  Imaging CAROTID DUPLEX 10/16/2014   CEREBROVASCULAR DUPLEX EVALUATION    INDICATION: Carotid artery disease     PREVIOUS INTERVENTION(S):     DUPLEX EXAM:     RIGHT  LEFT  Peak Systolic Velocities (cm/s) End Diastolic Velocities (cm/s) Plaque LOCATION Peak Systolic Velocities (cm/s) End Diastolic Velocities (cm/s) Plaque  84 19  CCA PROXIMAL 70 15   83 19  CCA MID 68 15   83 21 HT CCA DISTAL 70 15 HT  70 9  ECA 64 6   189 43 HT ICA PROXIMAL 75 21 HT  72 14  ICA MID 99 31   96 28  ICA DISTAL 78 24     2.27 ICA / CCA Ratio (PSV) 1.45  Antegrade  Vertebral Flow Antegrade   630 Brachial Systolic Pressure (mmHg) 160  Triphasic  Brachial Artery Waveforms Triphasic     Plaque Morphology:  HM = Homogeneous, HT = Heterogeneous, CP = Calcific Plaque, SP = Smooth Plaque, IP = Irregular Plaque     ADDITIONAL FINDINGS:     IMPRESSION: Right internal carotid artery velocities suggest a 40-59% stenosis.  Left internal carotid artery velocities suggest a <40% stenosis.     Compared to the previous exam:  No significant change in comparison to the last exam on 10/08/2013.     Assessment: [name] is a 78 y.o. female who presents with asymptomatic 40-59% right ICA stenosis and minimal left ICA stenosis. No significant change in comparison to the last exam on 10/08/2013.  Plan: Follow-up in 1 year with Carotid Duplex scan.   I discussed in depth with the patient the nature of atherosclerosis, and emphasized the importance of maximal medical management including strict control of blood pressure, blood glucose, and lipid levels, obtaining regular exercise, and continued cessation of smoking.  The patient is aware that without maximal medical management the underlying atherosclerotic disease process will progress, limiting the benefit of any interventions. The patient was given information about stroke  prevention and what symptoms should prompt the patient to seek immediate medical care. Thank you for allowing [place] to participate in this patient's care.  Clemon Chambers, RN, MSN, FNP-C [facility] Office: [phone]  Clinic Physician: Oneida Alar  10/16/2014 12:09 PM

## 2014-11-20 ENCOUNTER — Other Ambulatory Visit: Payer: Self-pay | Admitting: Family Medicine

## 2014-11-20 DIAGNOSIS — M5136 Other intervertebral disc degeneration, lumbar region: Secondary | ICD-10-CM

## 2014-12-05 ENCOUNTER — Other Ambulatory Visit: Payer: Self-pay | Admitting: Family Medicine

## 2014-12-05 ENCOUNTER — Ambulatory Visit
Admission: RE | Admit: 2014-12-05 | Discharge: 2014-12-05 | Disposition: A | Discharge status: Home / Self Care | Payer: Medicare Other | Source: Ambulatory Visit | Attending: Family Medicine | Admitting: Family Medicine

## 2014-12-05 DIAGNOSIS — M5136 Other intervertebral disc degeneration, lumbar region: Secondary | ICD-10-CM

## 2014-12-05 MED ORDER — METHYLPREDNISOLONE ACETATE 40 MG/ML INJ SUSP (RADIOLOG
120.0000 mg | Freq: Once | INTRAMUSCULAR | Status: AC
  Administered 2014-12-05: 120 mg via EPIDURAL

## 2014-12-05 MED ORDER — IOHEXOL 180 MG/ML  SOLN
1.0000 mL | Freq: Once | INTRAMUSCULAR | Status: AC | PRN
  Administered 2014-12-05: 1 mL via EPIDURAL

## 2015-03-07 ENCOUNTER — Other Ambulatory Visit: Payer: Self-pay | Admitting: Family Medicine

## 2015-03-07 DIAGNOSIS — M5136 Other intervertebral disc degeneration, lumbar region: Secondary | ICD-10-CM

## 2015-03-11 ENCOUNTER — Other Ambulatory Visit: Payer: Self-pay | Admitting: Family Medicine

## 2015-03-11 ENCOUNTER — Ambulatory Visit
Admission: RE | Admit: 2015-03-11 | Discharge: 2015-03-11 | Disposition: A | Discharge status: Home / Self Care | Payer: Medicare Other | Source: Ambulatory Visit | Attending: Family Medicine | Admitting: Family Medicine

## 2015-03-11 DIAGNOSIS — M5136 Other intervertebral disc degeneration, lumbar region: Secondary | ICD-10-CM

## 2015-03-11 MED ORDER — METHYLPREDNISOLONE ACETATE 40 MG/ML INJ SUSP (RADIOLOG
120.0000 mg | Freq: Once | INTRAMUSCULAR | Status: AC
  Administered 2015-03-11: 120 mg via EPIDURAL

## 2015-03-11 MED ORDER — IOHEXOL 180 MG/ML  SOLN
1.0000 mL | Freq: Once | INTRAMUSCULAR | Status: AC | PRN
  Administered 2015-03-11: 1 mL via EPIDURAL

## 2015-05-12 ENCOUNTER — Other Ambulatory Visit: Payer: Self-pay | Admitting: Family Medicine

## 2015-05-12 DIAGNOSIS — M5136 Other intervertebral disc degeneration, lumbar region: Secondary | ICD-10-CM

## 2015-05-13 ENCOUNTER — Other Ambulatory Visit: Payer: Self-pay | Admitting: Family Medicine

## 2015-05-13 ENCOUNTER — Ambulatory Visit
Admission: RE | Admit: 2015-05-13 | Discharge: 2015-05-13 | Disposition: A | Discharge status: Home / Self Care | Payer: Medicare Other | Source: Ambulatory Visit | Attending: Family Medicine | Admitting: Family Medicine

## 2015-05-13 DIAGNOSIS — M5136 Other intervertebral disc degeneration, lumbar region: Secondary | ICD-10-CM

## 2015-05-13 DIAGNOSIS — M51369 Other intervertebral disc degeneration, lumbar region without mention of lumbar back pain or lower extremity pain: Secondary | ICD-10-CM

## 2015-05-13 MED ORDER — IOHEXOL 180 MG/ML  SOLN
1.0000 mL | Freq: Once | INTRAMUSCULAR | Status: AC | PRN
  Administered 2015-05-13: 1 mL via EPIDURAL

## 2015-05-13 MED ORDER — METHYLPREDNISOLONE ACETATE 40 MG/ML INJ SUSP (RADIOLOG
120.0000 mg | Freq: Once | INTRAMUSCULAR | Status: AC
  Administered 2015-05-13: 120 mg via EPIDURAL

## 2015-08-15 ENCOUNTER — Other Ambulatory Visit: Payer: Self-pay | Admitting: Family Medicine

## 2015-08-15 DIAGNOSIS — M5416 Radiculopathy, lumbar region: Secondary | ICD-10-CM

## 2015-08-29 ENCOUNTER — Ambulatory Visit
Admission: RE | Admit: 2015-08-29 | Discharge: 2015-08-29 | Disposition: A | Discharge status: Home / Self Care | Payer: Medicare Other | Source: Ambulatory Visit | Attending: Family Medicine | Admitting: Family Medicine

## 2015-08-29 ENCOUNTER — Other Ambulatory Visit: Payer: Self-pay | Admitting: Family Medicine

## 2015-08-29 DIAGNOSIS — M5416 Radiculopathy, lumbar region: Secondary | ICD-10-CM

## 2015-08-29 MED ORDER — METHYLPREDNISOLONE ACETATE 40 MG/ML INJ SUSP (RADIOLOG
120.0000 mg | Freq: Once | INTRAMUSCULAR | Status: AC
  Administered 2015-08-29: 120 mg via EPIDURAL

## 2015-08-29 MED ORDER — IOHEXOL 180 MG/ML  SOLN
1.0000 mL | Freq: Once | INTRAMUSCULAR | Status: DC | PRN
  Administered 2015-08-29: 1 mL via EPIDURAL

## 2015-09-10 ENCOUNTER — Ambulatory Visit (INDEPENDENT_AMBULATORY_CARE_PROVIDER_SITE_OTHER): Payer: Medicare Other

## 2015-09-10 DIAGNOSIS — R42 Dizziness and giddiness: Secondary | ICD-10-CM

## 2015-09-22 ENCOUNTER — Other Ambulatory Visit: Payer: Self-pay | Admitting: Family Medicine

## 2015-09-22 DIAGNOSIS — R42 Dizziness and giddiness: Secondary | ICD-10-CM

## 2015-09-23 ENCOUNTER — Ambulatory Visit (INDEPENDENT_AMBULATORY_CARE_PROVIDER_SITE_OTHER): Payer: Medicare Other | Admitting: Family

## 2015-09-23 ENCOUNTER — Ambulatory Visit (HOSPITAL_COMMUNITY)
Admission: RE | Admit: 2015-09-23 | Discharge: 2015-09-23 | Disposition: A | Discharge status: Home / Self Care | Payer: Medicare Other | Source: Ambulatory Visit | Attending: Family | Admitting: Family

## 2015-09-23 ENCOUNTER — Encounter: Payer: Self-pay | Admitting: Family

## 2015-09-23 VITALS — BP 145/72 | HR 72 | Temp 97.6°F | Resp 16 | Ht 59.0 in | Wt 151.0 lb

## 2015-09-23 DIAGNOSIS — I6523 Occlusion and stenosis of bilateral carotid arteries: Secondary | ICD-10-CM

## 2015-09-23 DIAGNOSIS — E785 Hyperlipidemia, unspecified: Secondary | ICD-10-CM | POA: Diagnosis not present

## 2015-09-23 DIAGNOSIS — I10 Essential (primary) hypertension: Secondary | ICD-10-CM | POA: Insufficient documentation

## 2015-09-23 DIAGNOSIS — R42 Dizziness and giddiness: Secondary | ICD-10-CM

## 2015-09-23 NOTE — Progress Notes (Signed)
Established Carotid Patient   History of Present Illness  [name] is a 79 y.o. female patient of Dr. Kellie Simmering who has known carotid artery stenosis. She had severe back problems in 2011 which required outpatient vancomycin because of infection. She has had multiple operations on her lumbar and cervical spine and continues to have some discomfort in the left leg related to this. Medical record indicates that she had a right subcortical small vessel CVA in 2011.  Patient recalls having a stroke in 2011 as manifested by bilateral legs weakness; during that hospitalization she also had severe shingles on the left side of her body and a DVT. The patient denies any further stroke or TIA symptoms. The patient had physical therapy for leg strengthening and is walking independently with mild weakness in left leg and post-herpetic pain in left leg.  Patient reports bilateral vision diplopia in 2011 but had exercises to improve this.  The patient denies unilateral facial drooping.  The patient reports temporary expressive aphasia which has resolved.  Patient denies cardiac problems.   Patient reports New Medical or Surgical History: She had ESI's in May and September 2016, given by [facility], Dr. Jola Baptist (sp?).  Pt comes in a month early with c/o dizziness which started a week after the September 2nd ESI. Pt states she received a great of relief from bilateral hips pain after the ESI. She does not feel dizzy when lying down, she does feel dizzy with walking and sitting. Dizziness is worsening.  She had tingling in left arm two days ago that lasted about 15 minutes; she denies monocular loss of vision at that time, denies speech difficulties at that time. Pt saw Dr. Sandi Mariscal re these symptoms who advised pt to be evaluated here.   She had 3  c-spine surgeries, last one in 2010.  Pt states Dr. Sandi Mariscal referred her for an ENT evaluation.  She has an echocardiogram tomorrow.  She has an MRI  of her head scheduled soon.   Pt states a neurology referral was considered by Dr. Sandi Mariscal but she will pursue the above avenues first.  Pt Diabetic: Yes, diet controlled.  Pt smoker: non-smoker   Pt meds include:  Statin : No, caused severe myalgias ASA: Yes  Other anticoagulants/antiplatelets: no    Past Medical History  Diagnosis Date  . Hyperlipidemia   . Osteoarthritis of cervical spine   . History of shingles 11-2008  . Stroke 12-2009  . Carotid artery occlusion   . Kidney stones 2012  . Hypertension   . Allergy     Social History Social History  Substance Use Topics  . Smoking status: Former Smoker -- 1.00 packs/day for 3 years    Types: Cigarettes    Quit date: 02/08/1979  . Smokeless tobacco: Never Used  . Alcohol Use: No    Family History Family History  Problem Relation Age of Onset  . Dementia Mother   . Heart attack Father     Surgical History Past Surgical History  Procedure Laterality Date  . Hernia repair  1963    left side  . Abdominal hysterectomy  1973  . Lumbar disc surgery  1975  . Gallbladder surgery  1988    large stone blockage  . Ankle reconstruction  1990    right - 5 screws & plate  . Rotator cuff repair  1994    right  . Spine surgery  2000    C4-C5 fusion  . Stomach surgery  2002  mass rt & left (not cancerous)  . Spine surgery  2003    C6-C7 fusion  . Foot fusion  2005    left - 4 screws  . Spine surgery  2010    replace broken plate (cervical region)  . Spinal fusion surgery  2000    C4 and C5  . Spinal fusion surgery  2003, 2010    C6 and C7  . Cholecystectomy  1988  . Lumbar epidural injection  May-Aug-Sept. 2015/ 08-29-15    Allergies  Allergen Reactions  . Plavix [Clopidogrel Bisulfate] Other (See Comments)    Shakes and teeth chattering, "just didn't feel right"  . Prinivil [Lisinopril] Swelling    Lip swelling  . Sulfa Antibiotics Hives    Current Outpatient Prescriptions  Medication Sig  Dispense Refill  . aspirin 81 MG tablet Take 81 mg by mouth daily. Per patient, takes one 81 mg daily    . calcium carbonate (OS-CAL) 600 MG TABS tablet Take 600 mg by mouth 2 (two) times daily with a meal.    . colestipol (COLESTID) 1 G tablet Take 1 g by mouth 2 (two) times daily.    Marland Kitchen doxazosin (CARDURA) 1 MG tablet Take 2 mg by mouth at bedtime.     Marland Kitchen doxazosin (CARDURA) 2 MG tablet Take 2 mg by mouth at bedtime.    Marland Kitchen LORazepam (ATIVAN) 1 MG tablet Take 1 mg by mouth daily.    Marland Kitchen losartan (COZAAR) 100 MG tablet Take 100 mg by mouth daily.    Marland Kitchen LYRICA 50 MG capsule Take 50 mg by mouth daily. Max 3 tabs q day    . meloxicam (MOBIC) 7.5 MG tablet Take 7.5 mg by mouth daily.    . Multiple Vitamin (MULTIVITAMIN) tablet Take 1 tablet by mouth daily.    . Omega-3 Fatty Acids (FISH OIL PO) Take 3,000 mg by mouth daily.    Marland Kitchen omeprazole (PRILOSEC) 20 MG capsule Take 20 mg by mouth daily.    . VOLTAREN 1 % GEL Apply 2 g topically as needed.     No current facility-administered medications for this visit.    Review of Systems : See HPI for pertinent positives and negatives.  Physical Examination  Filed Vitals:   09/23/15 1438  BP: 159/76  Pulse: 79  Temp: 97.6 F (36.4 C)  TempSrc: Oral  Resp: 16  Height: 4\' 11"  (1.499 m)  Weight: 151 lb (68.493 kg)  SpO2: 97%   Body mass index is 30.48 kg/(m^2).  General: WDWN obese female in NAD  GAIT: normal  Eyes: PERRLA  Pulmonary: CTAB, no rales, no rhonchi, & no wheezing.  Cardiac: regular Rhythm, no detected murmur.   VASCULAR EXAM  Carotid Bruits  Left  Right    Negative  Negative    Aorta is not palpable.  Radial pulses are 2+ palpable and equal.   LE Pulses  LEFT  RIGHT   POPLITEAL  not palpable  not palpable   POSTERIOR TIBIAL  not palpable  not palpable   DORSALIS PEDIS  ANTERIOR TIBIAL  Faintly palpable  Faintly palpable    Gastrointestinal: soft, nontender, BS WNL, no r/g, no palpated masses.   Musculoskeletal: Negative muscle atrophy/wasting. M/S 5/5 throughout, Extremities without ischemic changes.  Neurologic: A&O X 3; Appropriate Affect ; SENSATION ;normal;  Speech is normal  CN 2-12 intact except, Pain and light touch intact in extremities, Motor exam as listed above.          Non-Invasive Vascular  Imaging CAROTID DUPLEX 09/23/2015   CEREBROVASCULAR DUPLEX EVALUATION    INDICATION: Carotid artery disease; dizziness    PREVIOUS INTERVENTION(S):     DUPLEX EXAM:     RIGHT  LEFT  Peak Systolic Velocities (cm/s) End Diastolic Velocities (cm/s) Plaque LOCATION Peak Systolic Velocities (cm/s) End Diastolic Velocities (cm/s) Plaque  73 10  CCA PROXIMAL 91 19   80 16  CCA MID 74 19   87 16 HT CCA DISTAL 70 17 HT  78 9  ECA 94 11   162 41 HT ICA PROXIMAL 70 23 HT  91 18  ICA MID 67 19   81 30  ICA DISTAL 104 31     2.0 ICA / CCA Ratio (PSV) 1.40  Antegrade  Vertebral Flow Antegrade    Brachial Systolic Pressure (mmHg)   Multiphasic (Subclavian artery) Brachial Artery Waveforms Multiphasic (Subclavian artery)    Plaque Morphology:  HM = Homogeneous, HT = Heterogeneous, CP = Calcific Plaque, SP = Smooth Plaque, IP = Irregular Plaque     ADDITIONAL FINDINGS: Technically difficult exam due to heavy respirations and short neck.    IMPRESSION: Right internal carotid artery velocities suggest a 40-59% stenosis.  Left internal carotid artery velocities suggest a <40% stenosis.     Compared to the previous exam:  No significant change in comparison to the last exam on 10/16/2014.      Assessment: [name] is a 79 y.o. female who presents with history of stroke in 2011, none subsequently.  Medical record indicates that she had a right subcortical small vessel CVA in 2011.  She returns a month earlier than her scheduled yearly return due to dizziness that started about a week after a September 2nd epidural steroid injection for low back and bilateral hip  pain; this afforded her a great deal of relief. Her PCP requested an evaluation by [place] re her carotid arteries. Pt states she is not dizzy supine, but is dizzy with sitting and standing, states the dizziness is worsening. She also has known c-spine issues having had several c-spine procedures.  Two days ago she had a transient episode of left arm tingling, no monocular loss of vision, no speech difficulties.  Today's carotid duplex suggests 40-59% right ICA stenosis and <40% left internal carotid artery stenosis. Both vertebral arteries are antegrade. No significant change in comparison to the last exam on 10/16/2014.   I discussed with Dr. Kellie Simmering pt symptoms and today's carotid duplex result. Carotid duplex results today are unchanged from almost a year ago, her symptoms started a couple of weeks ago, about a week after an ESI. Nothing on today's carotid duplex results requires intervention and does not lend any connection to pt's symptoms.   Plan: Follow-up in 1 year with Carotid Duplex.   I discussed in depth with the patient the nature of atherosclerosis, and emphasized the importance of maximal medical management including strict control of blood pressure, blood glucose, and lipid levels, obtaining regular exercise, and continued cessation of smoking.  The patient is aware that without maximal medical management the underlying atherosclerotic disease process will progress, limiting the benefit of any interventions. The patient was given information about stroke prevention and what symptoms should prompt the patient to seek immediate medical care. Thank you for allowing [place] to participate in this patient's care.  Clemon Chambers, RN, MSN, FNP-C [facility] Office: [phone]  Clinic Physician: Early  09/23/2015 2:46 PM

## 2015-09-23 NOTE — Patient Instructions (Signed)
Stroke Prevention Some medical conditions and behaviors are associated with an increased chance of having a stroke. You may prevent a stroke by making healthy choices and managing medical conditions. HOW CAN I REDUCE MY RISK OF HAVING A STROKE?   Stay physically active. Get at least 30 minutes of activity on most or all days.  Do not smoke. It may also be helpful to avoid exposure to secondhand smoke.  Limit alcohol use. Moderate alcohol use is considered to be:  No more than 2 drinks per day for men.  No more than 1 drink per day for nonpregnant women.  Eat healthy foods. This involves:  Eating 5 or more servings of fruits and vegetables a day.  Making dietary changes that address high blood pressure (hypertension), high cholesterol, diabetes, or obesity.  Manage your cholesterol levels.  Making food choices that are high in fiber and low in saturated fat, trans fat, and cholesterol may control cholesterol levels.  Take any prescribed medicines to control cholesterol as directed by your health care provider.  Manage your diabetes.  Controlling your carbohydrate and sugar intake is recommended to manage diabetes.  Take any prescribed medicines to control diabetes as directed by your health care provider.  Control your hypertension.  Making food choices that are low in salt (sodium), saturated fat, trans fat, and cholesterol is recommended to manage hypertension.  Take any prescribed medicines to control hypertension as directed by your health care provider.  Maintain a healthy weight.  Reducing calorie intake and making food choices that are low in sodium, saturated fat, trans fat, and cholesterol are recommended to manage weight.  Stop drug abuse.  Avoid taking birth control pills.  Talk to your health care provider about the risks of taking birth control pills if you are over 35 years old, smoke, get migraines, or have ever had a blood clot.  Get evaluated for sleep  disorders (sleep apnea).  Talk to your health care provider about getting a sleep evaluation if you snore a lot or have excessive sleepiness.  Take medicines only as directed by your health care provider.  For some people, aspirin or blood thinners (anticoagulants) are helpful in reducing the risk of forming abnormal blood clots that can lead to stroke. If you have the irregular heart rhythm of atrial fibrillation, you should be on a blood thinner unless there is a good reason you cannot take them.  Understand all your medicine instructions.  Make sure that other conditions (such as anemia or atherosclerosis) are addressed. SEEK IMMEDIATE MEDICAL CARE IF:   You have sudden weakness or numbness of the face, arm, or leg, especially on one side of the body.  Your face or eyelid droops to one side.  You have sudden confusion.  You have trouble speaking (aphasia) or understanding.  You have sudden trouble seeing in one or both eyes.  You have sudden trouble walking.  You have dizziness.  You have a loss of balance or coordination.  You have a sudden, severe headache with no known cause.  You have new chest pain or an irregular heartbeat. Any of these symptoms may represent a serious problem that is an emergency. Do not wait to see if the symptoms will go away. Get medical help at once. Call your local emergency services (911 in [place] Do not drive yourself to the hospital. Document Released: 01/20/2005 Document Revised: 04/29/2014 Document Reviewed: 06/15/2013 Exit[employer] Patient Information 2015 [employer]. This information is not intended to replace advice given   to you by your health care provider. Make sure you discuss any questions you have with your health care provider.  

## 2015-09-23 NOTE — Progress Notes (Signed)
Filed Vitals:   09/23/15 1438 09/23/15 1446  BP: 159/76 145/72  Pulse: 79 72  Temp: 97.6 F (36.4 C)   TempSrc: Oral   Resp: 16   Height: 4\' 11"  (1.499 m)   Weight: 151 lb (68.493 kg)   SpO2: 97%

## 2015-09-24 ENCOUNTER — Ambulatory Visit (HOSPITAL_COMMUNITY): Payer: Medicare Other | Attending: Cardiology

## 2015-09-24 ENCOUNTER — Other Ambulatory Visit (HOSPITAL_COMMUNITY): Payer: Self-pay | Admitting: Family Medicine

## 2015-09-24 ENCOUNTER — Other Ambulatory Visit: Payer: Self-pay

## 2015-09-24 DIAGNOSIS — I371 Nonrheumatic pulmonary valve insufficiency: Secondary | ICD-10-CM | POA: Diagnosis not present

## 2015-09-24 DIAGNOSIS — R42 Dizziness and giddiness: Secondary | ICD-10-CM

## 2015-09-24 DIAGNOSIS — Z79899 Other long term (current) drug therapy: Secondary | ICD-10-CM | POA: Diagnosis not present

## 2015-09-24 DIAGNOSIS — I071 Rheumatic tricuspid insufficiency: Secondary | ICD-10-CM | POA: Insufficient documentation

## 2015-09-24 DIAGNOSIS — I10 Essential (primary) hypertension: Secondary | ICD-10-CM | POA: Insufficient documentation

## 2015-09-24 DIAGNOSIS — R202 Paresthesia of skin: Secondary | ICD-10-CM

## 2015-09-24 DIAGNOSIS — I517 Cardiomegaly: Secondary | ICD-10-CM | POA: Diagnosis not present

## 2015-09-24 DIAGNOSIS — I351 Nonrheumatic aortic (valve) insufficiency: Secondary | ICD-10-CM | POA: Insufficient documentation

## 2015-09-24 DIAGNOSIS — I340 Nonrheumatic mitral (valve) insufficiency: Secondary | ICD-10-CM | POA: Insufficient documentation

## 2015-09-24 NOTE — Addendum Note (Signed)
Addended by: Dorthula Rue L on: 09/24/2015 09:51 AM   Modules accepted: Orders

## 2015-09-25 ENCOUNTER — Ambulatory Visit
Admission: RE | Admit: 2015-09-25 | Discharge: 2015-09-25 | Disposition: A | Discharge status: Home / Self Care | Payer: Medicare Other | Source: Ambulatory Visit | Attending: Family Medicine | Admitting: Family Medicine

## 2015-09-25 DIAGNOSIS — R42 Dizziness and giddiness: Secondary | ICD-10-CM

## 2015-09-25 MED ORDER — GADOBENATE DIMEGLUMINE 529 MG/ML IV SOLN
7.0000 mL | Freq: Once | INTRAVENOUS | Status: AC | PRN
  Administered 2015-09-25: 7 mL via INTRAVENOUS

## 2015-10-02 ENCOUNTER — Telehealth: Payer: Self-pay | Admitting: *Deleted

## 2015-10-02 ENCOUNTER — Ambulatory Visit (INDEPENDENT_AMBULATORY_CARE_PROVIDER_SITE_OTHER): Payer: Medicare Other | Admitting: Neurology

## 2015-10-02 ENCOUNTER — Encounter: Payer: Self-pay | Admitting: Neurology

## 2015-10-02 ENCOUNTER — Telehealth: Payer: Self-pay | Admitting: Neurology

## 2015-10-02 VITALS — BP 210/85 | HR 68 | Ht 59.0 in | Wt 153.8 lb

## 2015-10-02 DIAGNOSIS — I6523 Occlusion and stenosis of bilateral carotid arteries: Secondary | ICD-10-CM | POA: Diagnosis not present

## 2015-10-02 DIAGNOSIS — R202 Paresthesia of skin: Secondary | ICD-10-CM | POA: Diagnosis not present

## 2015-10-02 DIAGNOSIS — G4489 Other headache syndrome: Secondary | ICD-10-CM

## 2015-10-02 DIAGNOSIS — H539 Unspecified visual disturbance: Secondary | ICD-10-CM | POA: Diagnosis not present

## 2015-10-02 DIAGNOSIS — R42 Dizziness and giddiness: Secondary | ICD-10-CM

## 2015-10-02 NOTE — Telephone Encounter (Signed)
ENT evaluation, please call for notes.

## 2015-10-02 NOTE — Telephone Encounter (Signed)
Called and spoke to Dr. Deboraha Sprang nurse Mickel Baas and advised pt BP in office today was 210/85 manual and Dr. Jaynee Eagles wanted to know if pt should go back on BP medication. Pt advised she has been off of it for 7 days to trial to see if it was causing dizziness. She verbalized understanding and said she would give Dr Sandi Mariscal the message and then call the pt to advise on whether to go back to taking BP rx.

## 2015-10-02 NOTE — Patient Instructions (Addendum)
Overall you are doing fairly well but I do want to suggest a few things today:   Remember to drink plenty of fluid, eat healthy meals and do not skip any meals. Try to eat protein with a every meal and eat a healthy snack such as fruit or nuts in between meals. Try to keep a regular sleep-wake schedule and try to exercise daily, particularly in the form of walking, 20-30 minutes a day, if you can.   As far as your medications are concerned, I would like to suggest: Caffeine, lots of fluids  As far as diagnostic testing: Consider lumbar puncture blood patch, labs  I would like to see you back in 3 months, sooner if we need to. Please call us with any interim questions, concerns, problems, updates or refill requests.   Our phone number is [phone]. We also have an after hours call service for urgent matters and there is a physician on-call for urgent questions. For any emergencies you know to call 911 or go to the nearest emergency room

## 2015-10-02 NOTE — Progress Notes (Addendum)
[facility]    Provider:  Dr Jaynee Eagles Referring Provider: Derinda Late, MD Primary Care Physician:  Marylene Land, MD  CC:  dizziness  HPI:  [name] is a 79 y.o. female here as a referral from Dr. Sandi Mariscal for dizziness and presyncope. She has a past medical history of lumbosacral degenerative disease, hyperlipidemia, hypertension, fibromyalgia, cervical radiculopathy, migraines, left brachial artery thrombosis, vasovagal syncope, right carpal tunnel syndrome, intermittent benign positional vertigo, type 2 diabetes. She had an epidural steroid injection on September 2nd. Since then having headaches and dizziness. Extensive workup by Dr. Sandi Mariscal has been negative to date. She had carotid artery studies, echo, MRI and now is going to the ENT. She describes it as being lightheaded. No room spinning. Bending over makes her dizzy. About a week after the epidural steroid injection is when it all started. She is on Lyrica for her back pain but she has been on it for 4-5 years, no new medications or other known inciting factors except for the epidural steroid injection. No symptoms when she stands up. Headaches are occasionally. Drinking coffee helps her headache go away. Headaches occur when standing or sitting, no headache when laying down. She sometimes wakes up from a deep sleep with a headache, she snores but she denies excessive daytime sleepiness and is very energetic. Headaches started in the last week. She feels like she is in a cloud, a terrible head cold floating around. She was walking from the living room to the kitchen and she was sitting and all of a sudden she felt like she was going to faint. Then she had the pins and needles in the left hand which have since resolved. The headaches are in the temples. No vision changes. No jaw claudication. Patient reports that she had similar symptoms after last epidural steroid injection in May 2016. At that time the symptoms  lasted a few weeks. Denies dysarthria, dysphagia, aphasia, facial droop, new weakness, hearing changes, vision changes, neck stiffness, fever, chills or other systemic symptoms.  Reviewed notes, labs and imaging from outside physicians, which showed: Per Dr. Deboraha Sprang notes, patient had an epidural injection for her lumbar radiculopathy on 09/12/2015. Since then she's had frequent symptoms of dizziness. She's had presyncopal episodes, when she was bending forward. She sometimes feels to be worse with rapid changes of position of her head. No true vertigo.  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 Multiple studies recently included a Holter report which showed rare PVCs and PACs, and echocardiogram showed mild-to-moderate aortic regurgitation was otherwise unremarkable, and MRI and MRA of the brain scan showed an old right subcortical small vessel CVA and mild atrophy, there was mild to moderate left P2 PCA stenosis. Carotid Doppler study showed stable 40-59% right ICA stenosis and less than 40% left ICA stenosis.  MRI of the brain 08/2015: Personally reviewed images and agree with following. : 1. No acute intracranial abnormality or mass. 2. No medium or large vessel occlusion or significant proximal stenosis. Mild-to-moderate distal left P2 PCA stenosis. Appears artifactual. 3. Unchanged, tiny bulge along the left supraclinoid ICA. Also few small foci of T2 hyperintensity are again seen in the cerebral white matter which are nonspecific, and not greater than expected for patient's age  She has an allergy to Plavix and she is currently on aspirin.  Review of Systems: Patient complains of symptoms per HPI as well as the following symptoms: weight loss, fatigue, blurred  vision, easy bruising, toruble swallowing, incontinence, joint pain, joint swelling, aching muscles, rash, birth marks, moles, incontinence, runny  nose, memory loss, confusion, headahce, numbness, weakness, difficulty swallowing, too much sleeo, decreased sleep. Pertinent negatives per HPI. All others negative.   Social History   Social History  . Marital Status: Married    Spouse Name: Pilar Plate   . Number of Children: 3  . Years of Education: 12   Occupational History  . Not on file.   Social History Main Topics  . Smoking status: Former Smoker -- 1.00 packs/day for 3 years    Types: Cigarettes    Quit date: 02/08/1979  . Smokeless tobacco: Never Used  . Alcohol Use: No  . Drug Use: No  . Sexual Activity: Not on file   Other Topics Concern  . Not on file   Social History Narrative   Lives at home with husband, Pilar Plate.   Caffeine use: 2 cups coffee per day.    Family History  Problem Relation Age of Onset  . Dementia Mother   . Heart attack Father     Past Medical History  Diagnosis Date  . Hyperlipidemia   . Osteoarthritis of cervical spine   . History of shingles 11-2008  . Stroke ([place]) 12-2009  . Carotid artery occlusion   . Kidney stones 2012  . Hypertension   . Allergy     Past Surgical History  Procedure Laterality Date  . Hernia repair  1963    left side  . Abdominal hysterectomy  1973  . Lumbar disc surgery  1975  . Gallbladder surgery  1988    large stone blockage  . Ankle reconstruction  1990    right - 5 screws & plate  . Rotator cuff repair  1994    right  . Spine surgery  2000    C4-C5 fusion  . Stomach surgery  2002    mass rt & left (not cancerous)  . Spine surgery  2003    C6-C7 fusion  . Foot fusion  2005    left - 4 screws  . Spine surgery  2010    replace broken plate (cervical region)  . Spinal fusion surgery  2000    C4 and C5  . Spinal fusion surgery  2003, 2010    C6 and C7  . Cholecystectomy  1988  . Lumbar epidural injection  May-Aug-Sept. 2015/ 08-29-15    Current Outpatient Prescriptions  Medication Sig Dispense Refill  . aspirin 81 MG tablet Take 81 mg by  mouth daily. Per patient, takes one 81 mg daily    . calcium carbonate (OS-CAL) 600 MG TABS tablet Take 600 mg by mouth 2 (two) times daily with a meal.    . colestipol (COLESTID) 1 G tablet Take 1 g by mouth 2 (two) times daily.    Marland Kitchen doxazosin (CARDURA) 2 MG tablet Take 2 mg by mouth at bedtime.    Marland Kitchen LORazepam (ATIVAN) 1 MG tablet Take 1 mg by mouth daily.    Marland Kitchen losartan (COZAAR) 100 MG tablet Take 100 mg by mouth daily.    Marland Kitchen LYRICA 50 MG capsule Take 50 mg by mouth daily. Max 3 tabs q day    . meloxicam (MOBIC) 7.5 MG tablet Take 7.5 mg by mouth daily.    . Multiple Vitamin (MULTIVITAMIN) tablet Take 1 tablet by mouth daily.    . Omega-3 Fatty Acids (FISH OIL PO) Take 1,400 mg by mouth daily.     Marland Kitchen  omeprazole (PRILOSEC) 20 MG capsule Take 20 mg by mouth daily.    . VOLTAREN 1 % GEL Apply 2 g topically as needed.     No current facility-administered medications for this visit.    Allergies as of 10/02/2015 - Review Complete 10/02/2015  Allergen Reaction Noted  . Plavix [clopidogrel bisulfate] Other (See Comments) 02/09/2012  . Prinivil [lisinopril] Swelling 02/09/2012  . Sulfa antibiotics Hives 02/09/2012    Vitals: BP 210/85 mmHg  Pulse 68  Ht _0  (1.499 m)  Wt 153 lb 12.8 oz (69.763 kg)  BMI 31.05 kg/m2  SpO2 95% Last Weight:  Wt Readings from Last 1 Encounters:  10/02/15 153 lb 12.8 oz (69.763 kg)   Last Height:   Ht Readings from Last 1 Encounters:  10/02/15 _1  (1.499 m)    Physical exam: Exam: Gen: NAD, conversant, well nourised, obese, well groomed                     CV: RRR, no MRG. No Carotid Bruits. No peripheral edema, warm, nontender Eyes: Conjunctivae clear without exudates or hemorrhage  Neuro: Detailed Neurologic Exam  Speech:    Speech is normal; fluent and spontaneous with normal comprehension.  Cognition:    The patient is oriented to person, place, and time;     recent and remote memory intact;     language fluent;     normal attention,  concentration,     fund of knowledge Cranial Nerves:    The pupils are equal, round, and reactive to light. Attempted fundoscopic exam, coul dnot visualize. Visual fields are full to finger confrontation. Extraocular movements are intact. Trigeminal sensation is intact and the muscles of mastication are normal. The face is symmetric. The palate elevates in the midline. Hearing intact. Voice is normal. Shoulder shrug is normal. The tongue has normal motion without fasciculations.   Coordination:    No dysmetria  Gait:: very mild shuffling, uses walker      Motor Observation:     No asymmetry, no atrophy, and no involuntary movements noted. Tone:    Normal muscle tone.    Posture:    Posture is normal. normal erect    Strength:    Right proximal UE weakness(chronic). Bilateral hip flexion weakness otherwise strength is V/V in the upper and lower limbs.      Sensation: intact to LT     Reflex Exam:  DTR's:    Deep tendon reflexes in the upper and lower extremities are brisk for age and medical conditions bilaterally.   Toes:    The toes are equivocal bilaterally.   Clonus:    Clonus is absent.      Assessment/Plan:  79 year old female who presents with positional dizziness and headache after receiving a lumbar epidural steroid injection. Symptoms are worse with changes in position of her head and no true vertigo. Similar symptoms back in May at her last steroid epidural injection. MRI of the brain and MRA of the head were unremarkable. She has a past medical history of lumbosacral degenerative disease, hyperlipidemia, hypertension, fibromyalgia, cervical radiculopathy, migraines, left brachial artery thrombosis, vasovagal syncope, right carpal tunnel syndrome, intermittent benign positional vertigo, type 2 diabetes.  Extensive workup by Dr. Sandi Mariscal has been negative to date MRI, MRA, carotid artery studies, echo, ekg, holter monitor.  Although rare in epidural steroid  injections, the corticosteroids injection can cause side effects such as dizziness, headache, facial erythema, transient hypotension and hypertension, gastritis, mood swings, pruritus, insomnia.  Patient is going to be evaluated by ENT, will follow results. Pending evaluation, may consider a blood patch for patient as could also be a very small csf leak causing headache and dizziness after ESI. She has had similar symptoms after last ESI as well which resolved on their own, can also follow clinically. Hydration and caffeine help.  BP today 210/85, called Dr. Deboraha Sprang office and will restart HTN medications that were on hold Continue ASA and statin for stroke prevention. Continue to follow with Dr. Sandi Mariscal for management of vascular risk factors. Esr and crp to eval for temporal arteritis due to temple pain and blurred vision on the right eye  Addendum from dr blomgren: b12 935, tsh 3.3  Sarina Ill, MD  [facility] [address] [place], [place] [address]  Phone [phone] Fax [phone]

## 2015-10-03 ENCOUNTER — Telehealth: Payer: Self-pay | Admitting: *Deleted

## 2015-10-03 LAB — C-REACTIVE PROTEIN: CRP: 6.5 mg/L — ABNORMAL HIGH (ref 0.0–4.9)

## 2015-10-03 LAB — SEDIMENTATION RATE: SED RATE: 9 mm/h (ref 0–40)

## 2015-10-03 NOTE — Telephone Encounter (Signed)
Spoke with pt about unremarkable lab results. She verbalized understanding.

## 2015-10-03 NOTE — Telephone Encounter (Signed)
-----   Message from Melvenia Beam, MD sent at 10/03/2015  9:52 AM EDT ----- Let patient know her labs were unremarkable thanks

## 2015-10-06 NOTE — Telephone Encounter (Signed)
Spoke to husband last week. His wife is having significant memory loss, behavioral outbursts, difficulty performing tasks and with executive function. She sometimes does not know who he is. I do believe patient likely has dementia but she refuses to be examined. I started aricept and will ask [facility] to send a physical therapist in for gair and safety evaluation due to patient falls and also from what I remember they can send in a nurse to counsel family. Can you please get this arranged emma? Please call [facility] and see what they need. I believe they can see my note in EPIC. Let me know, thanks

## 2015-10-06 NOTE — Telephone Encounter (Signed)
Please disregard, this note was entered in error

## 2015-10-13 ENCOUNTER — Other Ambulatory Visit: Payer: Self-pay | Admitting: Neurology

## 2015-10-13 ENCOUNTER — Telehealth: Payer: Self-pay | Admitting: Neurology

## 2015-10-13 DIAGNOSIS — G9600 Cerebrospinal fluid leak, unspecified: Secondary | ICD-10-CM

## 2015-10-13 NOTE — Telephone Encounter (Signed)
Sure, we can place the referral. Terrence Dupont would you take care of it please? thanks

## 2015-10-13 NOTE — Telephone Encounter (Signed)
Called pt to let her know I will let Dr. Jaynee Eagles know about her message and that he is only there tomorrow and Wednesday if she approves. Dr Jaynee Eagles is with pt right now and I will call her back as soon as possible. She verbalized understanding.

## 2015-10-13 NOTE — Telephone Encounter (Signed)
Pt called inquiring if Dr Jaynee Eagles is going to refer her for blood patch as [name] will only be there Tuesday and Wednesday this week. Please call and advise.

## 2015-10-13 NOTE — Telephone Encounter (Signed)
Pt called stating she had discussed having blood patch with PCP and he agrees. She is requesting to get that scheduled. Dr Sandi Mariscal is suggesting she go to [facility] to perform this procedure. He will only be there this week 10/18 and 10/19.

## 2015-10-13 NOTE — Telephone Encounter (Signed)
Placed referral for blood patch to be done at [place] with Dr. Jola Baptist. Faxed referral to [phone]. Received fax confirmation.   Called pt to let her know referral was placed and [facility] should be contacting her to schedule. Pt also called her other Dr. Gabriel Carina to place order so she could get scheduled.

## 2015-10-14 ENCOUNTER — Other Ambulatory Visit: Payer: Self-pay | Admitting: Neurology

## 2015-10-14 DIAGNOSIS — R51 Headache: Principal | ICD-10-CM

## 2015-10-14 DIAGNOSIS — R519 Headache, unspecified: Secondary | ICD-10-CM

## 2015-10-14 DIAGNOSIS — R42 Dizziness and giddiness: Secondary | ICD-10-CM

## 2015-10-15 ENCOUNTER — Ambulatory Visit
Admission: RE | Admit: 2015-10-15 | Discharge: 2015-10-15 | Disposition: A | Discharge status: Home / Self Care | Payer: Medicare Other | Source: Ambulatory Visit | Attending: Neurology | Admitting: Neurology

## 2015-10-15 DIAGNOSIS — R42 Dizziness and giddiness: Secondary | ICD-10-CM

## 2015-10-15 DIAGNOSIS — R519 Headache, unspecified: Secondary | ICD-10-CM

## 2015-10-15 DIAGNOSIS — R51 Headache: Principal | ICD-10-CM

## 2015-10-15 MED ORDER — IOHEXOL 180 MG/ML  SOLN
1.0000 mL | Freq: Once | INTRAMUSCULAR | Status: DC | PRN
  Administered 2015-10-15: 1 mL via EPIDURAL

## 2015-10-15 NOTE — Discharge Instructions (Signed)
Blood Patch  Lumb Discharge Instructions  1. Go home and rest quietly for the next 24 hours.  It is important to lie flat for the next 24 hours.  Get up only to go to the restroom.  You may lie in the bed or on a couch on your back, your stomach, your left side or your right side.  You may have one pillow under your head.  You may have pillows between your knees while you are on your side or under your knees while you are on your back.  2. DO NOT drive today.  Recline the seat as far back as it will go, while still wearing your seat belt, on the way home.  3. You may get up to go to the bathroom as needed.  You may sit up for 10 minutes to eat.  You may resume your normal diet and medications unless otherwise indicated.  Drink lots of extra fluids today and tomorrow  4. You may resume normal activities after your 24 hours of bed rest is over; however, do not exert yourself strongly or do any heavy lifting tomorrow.  5. Call your physician for a follow-up appointment.   6. If you have any questions  after you arrive home, please call [phone].  Discharge instructions have been explained to the patient.  The patient, or the person responsible for the patient, fully understands these instructions.

## 2015-10-15 NOTE — Progress Notes (Signed)
Blood drawn from right wrist for blood patch 15-16 cc's obtained. Site is unremarkable and pt tolerated procedure well.

## 2015-10-17 ENCOUNTER — Telehealth: Payer: Self-pay

## 2015-10-17 NOTE — Telephone Encounter (Signed)
I called [name] to see how she was feeling after her Epidural Blood Patch here two days ago on 10/15/15.  She states that after the procedure and 24 hours of bedrest her low back is a little sore but that she has no headaches.  She also says her balance is much better and that she's feeling a lot "like my old self again."  She is using her walker "some" but is trusting her balance more.  Dr. Jola Baptist made aware.  Brita Romp, RN

## 2015-10-21 ENCOUNTER — Encounter (HOSPITAL_COMMUNITY): Payer: Medicare Other

## 2015-10-21 ENCOUNTER — Ambulatory Visit: Payer: Medicare Other | Admitting: Family

## 2015-10-21 ENCOUNTER — Other Ambulatory Visit (HOSPITAL_COMMUNITY): Payer: Medicare Other

## 2015-11-19 ENCOUNTER — Emergency Department (HOSPITAL_COMMUNITY)
Admission: EM | Admit: 2015-11-19 | Discharge: 2015-11-20 | Disposition: A | Discharge status: Home / Self Care | Payer: Medicare Other | Attending: Emergency Medicine | Admitting: Emergency Medicine

## 2015-11-19 ENCOUNTER — Emergency Department (HOSPITAL_COMMUNITY): Payer: Medicare Other

## 2015-11-19 ENCOUNTER — Encounter (HOSPITAL_COMMUNITY): Payer: Self-pay | Admitting: Emergency Medicine

## 2015-11-19 DIAGNOSIS — R000 Tachycardia, unspecified: Secondary | ICD-10-CM | POA: Insufficient documentation

## 2015-11-19 DIAGNOSIS — E119 Type 2 diabetes mellitus without complications: Secondary | ICD-10-CM | POA: Diagnosis not present

## 2015-11-19 DIAGNOSIS — K5900 Constipation, unspecified: Secondary | ICD-10-CM | POA: Insufficient documentation

## 2015-11-19 DIAGNOSIS — R1032 Left lower quadrant pain: Secondary | ICD-10-CM | POA: Diagnosis present

## 2015-11-19 DIAGNOSIS — I10 Essential (primary) hypertension: Secondary | ICD-10-CM | POA: Diagnosis not present

## 2015-11-19 DIAGNOSIS — Z87891 Personal history of nicotine dependence: Secondary | ICD-10-CM | POA: Diagnosis not present

## 2015-11-19 DIAGNOSIS — Z8673 Personal history of transient ischemic attack (TIA), and cerebral infarction without residual deficits: Secondary | ICD-10-CM | POA: Insufficient documentation

## 2015-11-19 DIAGNOSIS — Z87442 Personal history of urinary calculi: Secondary | ICD-10-CM | POA: Insufficient documentation

## 2015-11-19 DIAGNOSIS — Z7982 Long term (current) use of aspirin: Secondary | ICD-10-CM | POA: Insufficient documentation

## 2015-11-19 DIAGNOSIS — Z79899 Other long term (current) drug therapy: Secondary | ICD-10-CM | POA: Diagnosis not present

## 2015-11-19 DIAGNOSIS — M47812 Spondylosis without myelopathy or radiculopathy, cervical region: Secondary | ICD-10-CM | POA: Diagnosis not present

## 2015-11-19 DIAGNOSIS — K5732 Diverticulitis of large intestine without perforation or abscess without bleeding: Secondary | ICD-10-CM | POA: Insufficient documentation

## 2015-11-19 DIAGNOSIS — E785 Hyperlipidemia, unspecified: Secondary | ICD-10-CM | POA: Insufficient documentation

## 2015-11-19 DIAGNOSIS — Z8619 Personal history of other infectious and parasitic diseases: Secondary | ICD-10-CM | POA: Insufficient documentation

## 2015-11-19 HISTORY — DX: Type 2 diabetes mellitus without complications: E11.9

## 2015-11-19 LAB — CBC
HCT: 41.8 % (ref 36.0–46.0)
Hemoglobin: 13.9 g/dL (ref 12.0–15.0)
MCH: 31.3 pg (ref 26.0–34.0)
MCHC: 33.3 g/dL (ref 30.0–36.0)
MCV: 94.1 fL (ref 78.0–100.0)
PLATELETS: 239 10*3/uL (ref 150–400)
RBC: 4.44 MIL/uL (ref 3.87–5.11)
RDW: 12.8 % (ref 11.5–15.5)
WBC: 13.2 10*3/uL — AB (ref 4.0–10.5)

## 2015-11-19 LAB — URINALYSIS, ROUTINE W REFLEX MICROSCOPIC
Glucose, UA: NEGATIVE mg/dL
Hgb urine dipstick: NEGATIVE
KETONES UR: 15 mg/dL — AB
NITRITE: NEGATIVE
PROTEIN: 30 mg/dL — AB
Specific Gravity, Urine: 1.021 (ref 1.005–1.030)
pH: 5.5 (ref 5.0–8.0)

## 2015-11-19 LAB — COMPREHENSIVE METABOLIC PANEL
ALT: 16 U/L (ref 14–54)
AST: 22 U/L (ref 15–41)
Albumin: 3.4 g/dL — ABNORMAL LOW (ref 3.5–5.0)
Alkaline Phosphatase: 88 U/L (ref 38–126)
Anion gap: 13 (ref 5–15)
BILIRUBIN TOTAL: 0.8 mg/dL (ref 0.3–1.2)
BUN: 12 mg/dL (ref 6–20)
CO2: 23 mmol/L (ref 22–32)
CREATININE: 1.47 mg/dL — AB (ref 0.44–1.00)
Calcium: 9.4 mg/dL (ref 8.9–10.3)
Chloride: 100 mmol/L — ABNORMAL LOW (ref 101–111)
GFR, EST AFRICAN AMERICAN: 39 mL/min — AB (ref >60)
GFR, EST NON AFRICAN AMERICAN: 33 mL/min — AB (ref >60)
Glucose, Bld: 130 mg/dL — ABNORMAL HIGH (ref 65–99)
POTASSIUM: 3.6 mmol/L (ref 3.5–5.1)
Sodium: 136 mmol/L (ref 135–145)
TOTAL PROTEIN: 6.9 g/dL (ref 6.5–8.1)

## 2015-11-19 LAB — URINE MICROSCOPIC-ADD ON: RBC / HPF: NONE SEEN RBC/hpf (ref 0–5)

## 2015-11-19 LAB — LIPASE, BLOOD: LIPASE: 28 U/L (ref 11–51)

## 2015-11-19 MED ORDER — FENTANYL CITRATE (PF) 100 MCG/2ML IJ SOLN
50.0000 ug | Freq: Once | INTRAMUSCULAR | Status: AC
  Administered 2015-11-19: 50 ug via INTRAVENOUS
  Filled 2015-11-19: qty 2

## 2015-11-19 MED ORDER — SODIUM CHLORIDE 0.9 % IV BOLUS (SEPSIS)
1000.0000 mL | Freq: Once | INTRAVENOUS | Status: AC
  Administered 2015-11-20: 1000 mL via INTRAVENOUS

## 2015-11-19 MED ORDER — METRONIDAZOLE IN NACL 5-0.79 MG/ML-% IV SOLN
500.0000 mg | Freq: Once | INTRAVENOUS | Status: AC
  Administered 2015-11-19: 500 mg via INTRAVENOUS
  Filled 2015-11-19: qty 100

## 2015-11-19 MED ORDER — CIPROFLOXACIN IN D5W 400 MG/200ML IV SOLN
400.0000 mg | Freq: Once | INTRAVENOUS | Status: AC
  Administered 2015-11-19: 400 mg via INTRAVENOUS
  Filled 2015-11-19: qty 200

## 2015-11-19 MED ORDER — SODIUM CHLORIDE 0.9 % IV BOLUS (SEPSIS)
1000.0000 mL | Freq: Once | INTRAVENOUS | Status: AC
  Administered 2015-11-19: 1000 mL via INTRAVENOUS

## 2015-11-19 NOTE — Progress Notes (Signed)
HPI: 79 yo female for evaluation of cerebrovascular disease. Nuclear study 2005 showed ejection fraction 87% and no ischemia or infarction. Carotid Dopplers October 2013 showed 60-79% right and less than 40% left stenosis. Echocardiogram September 2016 showed normal LV function, grade 1 diastolic dysfunction, mild left ventricular hypertrophy, mild to moderate aortic insufficiency and mild tricuspid regurgitation.Holter monitor September 2016 showed sinus with PACs and PVCs.   Current Outpatient Prescriptions  Medication Sig Dispense Refill  . aspirin 81 MG tablet Take 81 mg by mouth daily. Per patient, takes one 81 mg daily    . calcium carbonate (OS-CAL) 600 MG TABS tablet Take 600 mg by mouth 2 (two) times daily with a meal.    . colestipol (COLESTID) 1 G tablet Take 1 g by mouth 2 (two) times daily.    Marland Kitchen doxazosin (CARDURA) 2 MG tablet Take 2 mg by mouth at bedtime.    Marland Kitchen LORazepam (ATIVAN) 1 MG tablet Take 1 mg by mouth daily.    Marland Kitchen losartan (COZAAR) 100 MG tablet Take 100 mg by mouth daily.    Marland Kitchen LYRICA 50 MG capsule Take 50 mg by mouth daily. Max 3 tabs q day    . meloxicam (MOBIC) 7.5 MG tablet Take 7.5 mg by mouth daily.    . Multiple Vitamin (MULTIVITAMIN) tablet Take 1 tablet by mouth daily.    . Omega-3 Fatty Acids (FISH OIL PO) Take 1,400 mg by mouth daily.     Marland Kitchen omeprazole (PRILOSEC) 20 MG capsule Take 20 mg by mouth daily.    . VOLTAREN 1 % GEL Apply 2 g topically as needed.     No current facility-administered medications for this visit.    Allergies  Allergen Reactions  . Plavix [Clopidogrel Bisulfate] Other (See Comments)    Shakes and teeth chattering, "just didn't feel right"  . Prinivil [Lisinopril] Swelling    Lip swelling  . Sulfa Antibiotics Hives    Past Medical History  Diagnosis Date  . Hyperlipidemia   . Osteoarthritis of cervical spine   . History of shingles 11-2008  . Stroke ([place]) 12-2009  . Carotid artery occlusion   . Kidney stones 2012  .  Hypertension   . Allergy     Past Surgical History  Procedure Laterality Date  . Hernia repair  1963    left side  . Abdominal hysterectomy  1973  . Lumbar disc surgery  1975  . Gallbladder surgery  1988    large stone blockage  . Ankle reconstruction  1990    right - 5 screws & plate  . Rotator cuff repair  1994    right  . Spine surgery  2000    C4-C5 fusion  . Stomach surgery  2002    mass rt & left (not cancerous)  . Spine surgery  2003    C6-C7 fusion  . Foot fusion  2005    left - 4 screws  . Spine surgery  2010    replace broken plate (cervical region)  . Spinal fusion surgery  2000    C4 and C5  . Spinal fusion surgery  2003, 2010    C6 and C7  . Cholecystectomy  1988  . Lumbar epidural injection  May-Aug-Sept. 2015/ 08-29-15    Social History   Social History  . Marital Status: Married    Spouse Name: Pilar Plate   . Number of Children: 3  . Years of Education: 12   Occupational History  . Not  on file.   Social History Main Topics  . Smoking status: Former Smoker -- 1.00 packs/day for 3 years    Types: Cigarettes    Quit date: 02/08/1979  . Smokeless tobacco: Never Used  . Alcohol Use: No  . Drug Use: No  . Sexual Activity: Not on file   Other Topics Concern  . Not on file   Social History Narrative   Lives at home with husband, Pilar Plate.   Caffeine use: 2 cups coffee per day.    Family History  Problem Relation Age of Onset  . Dementia Mother   . Heart attack Father     ROS: no fevers or chills, productive cough, hemoptysis, dysphasia, odynophagia, melena, hematochezia, dysuria, hematuria, rash, seizure activity, orthopnea, PND, pedal edema, claudication. Remaining systems are negative.  Physical Exam:   There were no vitals taken for this visit.  General:  Well developed/well nourished in NAD Skin warm/dry Patient not depressed No peripheral clubbing Back-normal HEENT-normal/normal eyelids Neck supple/normal carotid upstroke bilaterally;  no bruits; no JVD; no thyromegaly chest - CTA/ normal expansion CV - RRR/normal S1 and S2; no murmurs, rubs or gallops;  PMI nondisplaced Abdomen -NT/ND, no HSM, no mass, + bowel sounds, no bruit 2+ femoral pulses, no bruits Ext-no edema, chords, 2+ DP Neuro-grossly nonfocal  ECG    This encounter was created in error - please disregard.

## 2015-11-19 NOTE — ED Notes (Signed)
MD at bedside. 

## 2015-11-19 NOTE — ED Provider Notes (Signed)
CSN: [id number]     Arrival date & time 11/19/15  2159 History   First MD Initiated Contact with Patient 11/19/15 2216     Chief Complaint  Patient presents with  . Constipation  . Abdominal Pain    Patient is a 79 y.o. female presenting with abdominal pain. The history is provided by the patient.  Abdominal Pain Pain location:  LLQ Pain quality: cramping   Pain radiates to:  RLQ Pain severity:  Moderate Onset quality:  Gradual Duration:  1 week Timing:  Constant Chronicity:  New Relieved by:  Nothing Associated symptoms: constipation and vomiting   Associated symptoms: no chest pain, no dysuria, no fever, no hematuria, no nausea, no shortness of breath and no sore throat   Risk factors: being elderly     Past Medical History  Diagnosis Date  . Hyperlipidemia   . Osteoarthritis of cervical spine   . History of shingles 11-2008  . Stroke ([place]) 12-2009  . Carotid artery occlusion   . Kidney stones 2012  . Hypertension   . Allergy    Past Surgical History  Procedure Laterality Date  . Hernia repair  1963    left side  . Abdominal hysterectomy  1973  . Lumbar disc surgery  1975  . Gallbladder surgery  1988    large stone blockage  . Ankle reconstruction  1990    right - 5 screws & plate  . Rotator cuff repair  1994    right  . Spine surgery  2000    C4-C5 fusion  . Stomach surgery  2002    mass rt & left (not cancerous)  . Spine surgery  2003    C6-C7 fusion  . Foot fusion  2005    left - 4 screws  . Spine surgery  2010    replace broken plate (cervical region)  . Spinal fusion surgery  2000    C4 and C5  . Spinal fusion surgery  2003, 2010    C6 and C7  . Cholecystectomy  1988  . Lumbar epidural injection  May-Aug-Sept. 2015/ 08-29-15   Family History  Problem Relation Age of Onset  . Dementia Mother   . Heart attack Father    Social History  Substance Use Topics  . Smoking status: Former Smoker -- 1.00 packs/day for 3 years    Types: Cigarettes     Quit date: 02/08/1979  . Smokeless tobacco: Never Used  . Alcohol Use: No   OB History    No data available     Review of Systems  Constitutional: Negative for fever.  HENT: Negative for rhinorrhea and sore throat.   Eyes: Negative for visual disturbance.  Respiratory: Negative for chest tightness and shortness of breath.   Cardiovascular: Negative for chest pain and palpitations.  Gastrointestinal: Positive for vomiting, abdominal pain and constipation. Negative for nausea.  Genitourinary: Negative for dysuria and hematuria.  Musculoskeletal: Negative for back pain and neck pain.  Skin: Negative for rash.  Neurological: Negative for dizziness and headaches.  Psychiatric/Behavioral: Negative for confusion.  All other systems reviewed and are negative.     Allergies  Plavix; Prinivil; and Sulfa antibiotics  Home Medications   Prior to Admission medications   Medication Sig Start Date End Date Taking? Authorizing Provider  aspirin 81 MG tablet Take 81 mg by mouth daily. Per patient, takes one 81 mg daily   Yes Historical Provider, MD  calcium carbonate (OS-CAL) 600 MG TABS tablet Take 600 mg  by mouth 2 (two) times daily with a meal.   Yes Historical Provider, MD  colestipol (COLESTID) 1 G tablet Take 1 g by mouth 2 (two) times daily.   Yes Historical Provider, MD  LORazepam (ATIVAN) 1 MG tablet Take 1 mg by mouth daily. 09/08/12  Yes Historical Provider, MD  losartan (COZAAR) 100 MG tablet Take 100 mg by mouth daily. 09/08/12  Yes Historical Provider, MD  Multiple Vitamin (MULTIVITAMIN) tablet Take 1 tablet by mouth daily.   Yes Historical Provider, MD  Omega-3 Fatty Acids (FISH OIL PO) Take 1,400 mg by mouth daily.    Yes Historical Provider, MD  omeprazole (PRILOSEC) 20 MG capsule Take 20 mg by mouth daily. 09/08/12  Yes Historical Provider, MD  polyethylene glycol (MIRALAX / GLYCOLAX) packet Take 17 g by mouth daily.   Yes Historical Provider, MD  VOLTAREN 1 % GEL Apply 2 g  topically daily as needed. For pain 09/04/12  Yes Historical Provider, MD   BP 136/72 mmHg  Pulse 105  Temp(Src) 98.4 F (36.9 C) (Oral)  Resp 16  Ht 5\' 4"  (1.626 m)  Wt 69.4 kg  BMI 26.25 kg/m2  SpO2 96% Physical Exam  Constitutional: She is oriented to person, place, and time. She appears well-developed and well-nourished. No distress.  HENT:  Head: Normocephalic and atraumatic.  Mouth/Throat: Oropharynx is clear and moist.  Eyes: EOM are normal. Pupils are equal, round, and reactive to light.  Neck: Neck supple. No JVD present.  Cardiovascular: Regular rhythm, normal heart sounds and intact distal pulses.  Tachycardia present.  Exam reveals no gallop.   No murmur heard. Pulmonary/Chest: Effort normal and breath sounds normal. She has no wheezes. She has no rales.  Abdominal: Soft. She exhibits no distension. There is tenderness in the left lower quadrant. There is no rigidity, no rebound and no guarding.  Musculoskeletal: Normal range of motion. She exhibits no tenderness.  Neurological: She is alert and oriented to person, place, and time. No cranial nerve deficit. She exhibits normal muscle tone.  Skin: Skin is warm and dry. No rash noted.  Psychiatric: Her behavior is normal.    ED Course  Procedures  None   Labs Review Labs Reviewed  CBC - Abnormal; Notable for the following:    WBC 13.2 (*)    All other components within normal limits  URINALYSIS, ROUTINE W REFLEX MICROSCOPIC (NOT AT [facility]) - Abnormal; Notable for the following:    Color, Urine AMBER (*)    APPearance CLOUDY (*)    Bilirubin Urine MODERATE (*)    Ketones, ur 15 (*)    Protein, ur 30 (*)    Leukocytes, UA SMALL (*)    All other components within normal limits  URINE MICROSCOPIC-ADD ON - Abnormal; Notable for the following:    Squamous Epithelial / LPF 0-5 (*)    Bacteria, UA RARE (*)    All other components within normal limits  LIPASE, BLOOD  COMPREHENSIVE METABOLIC PANEL    Imaging  Review Dg Abd Acute W/chest  11/19/2015  CLINICAL DATA:  Recent food poisoning, with diarrhea. Acute onset of lower abdominal cramping and constipation. Initial encounter. EXAM: DG ABDOMEN ACUTE W/ 1V CHEST COMPARISON:  Abdominal radiograph performed 10/18/2011, and chest radiograph performed 01/10/2010 FINDINGS: The lungs are well-aerated and clear. There is no evidence of focal opacification, pleural effusion or pneumothorax. The cardiomediastinal silhouette is within normal limits. Scattered air-fluid levels are noted within the small and large bowel, concerning for mild ileus. No  free intra-abdominal air is identified on the provided upright view. Clips are noted within the right upper quadrant, reflecting prior cholecystectomy. No acute osseous abnormalities are seen; the sacroiliac joints are unremarkable in appearance. The cervical spinal fusion hardware is noted. Mild degenerative change is noted at the lower lumbar spine. IMPRESSION: 1. Scattered air-fluid levels within the small and large bowel, concerning for mild ileus. 2. No acute cardiopulmonary process seen. Electronically Signed   By: Garald Balding M.D.   On: 11/19/2015 23:33   I have personally reviewed and evaluated these images and lab results as part of my medical decision-making.  MDM   Final diagnoses:  Constipation  Diverticulitis of large intestine without perforation or abscess without bleeding    79 yo F with a PMH of HLD, CVA, HTN, kidney stones presents with abdominal pain and constipation. She had a case of food poisoning last week and had profuse diarrhea which is now followed by 1 week of constipation. She had temp 101.12F at home today. She reports some vomiting 2 days ago but none today. She has not had a BM in 1 week. She has mild LLQ TTP without peritonitis. Afebrile here, mildly tachycardic but otherwise HDS. Labs show elevated white count. Acute abdominal XR shows air fluid levels. Concerning for SBO. Will get CT.  Pain meds and fluids given.   CT with acute diverticulitis without complication. No SBO or abscess. Will d/c home with cipro and flagyl. Instructed to f/u with PCP next week. Pt and family agreeable with plan. Able to tolerate PO here.  Discussed with Dr. Audie Pinto.    Gustavus Bryant, MD 11/20/15 [id number]  Leonard Schwartz, MD 11/21/15 [phone]

## 2015-11-19 NOTE — ED Notes (Signed)
Pt states she had food poisoning  on the 15th of this month which caused a lot of diarrhea so pt had been taking imodium to stop diarrhea. After stopping Imodium on Friday pt has not had a bowel movement since then.  Pt also reports lower abd cramping.

## 2015-11-20 ENCOUNTER — Emergency Department (HOSPITAL_COMMUNITY): Payer: Medicare Other

## 2015-11-20 ENCOUNTER — Encounter (HOSPITAL_COMMUNITY): Payer: Self-pay | Admitting: Radiology

## 2015-11-20 DIAGNOSIS — K5732 Diverticulitis of large intestine without perforation or abscess without bleeding: Secondary | ICD-10-CM | POA: Diagnosis not present

## 2015-11-20 MED ORDER — CIPROFLOXACIN HCL 500 MG PO TABS: 500.0000 mg | ORAL_TABLET | Freq: Two times a day (BID) | ORAL | Status: DC

## 2015-11-20 MED ORDER — METRONIDAZOLE 500 MG PO TABS: 500.0000 mg | ORAL_TABLET | Freq: Three times a day (TID) | ORAL | Status: DC

## 2015-11-20 MED ORDER — IOHEXOL 300 MG/ML  SOLN
80.0000 mL | Freq: Once | INTRAMUSCULAR | Status: AC | PRN
  Administered 2015-11-20: 80 mL via INTRAVENOUS

## 2015-11-28 ENCOUNTER — Encounter: Payer: Medicare Other | Admitting: Cardiology

## 2015-12-24 ENCOUNTER — Telehealth: Payer: Self-pay | Admitting: Cardiology

## 2015-12-24 NOTE — Telephone Encounter (Signed)
Received records from Dr Derinda Late for appointment on 01/02/16 with Dr Stanford Breed.  Records given to [facility] (medical records) for Dr Jacalyn Lefevre schedule on 01/02/16. lp

## 2015-12-31 NOTE — Progress Notes (Signed)
HPI: 80 year old female for evaluation of hyperlipidemia. Patient has been seen in this office but not since 2011. Nuclear study in 2005 showed an ejection fraction of 87% and no ischemia. Patient has cerebrovascular disease followed by vascular surgery. Echocardiogram September 2016 showed vigorous LV function, grade 1 diastolic dysfunction, mild to moderate aortic insufficiency and mild tricuspid regurgitation. Holter monitor September 2016 showed normal sinus rhythm to sinus tachycardia with occasional PAC and PVC. Carotid Dopplers September 2016 showed 40-59% right and less than 40% left stenosis. Patient has had intolerances to Lipitor Crestor Pravachol Zocor lovastatin fluvastatin and Niaspan. Cholesterol in November 2016 showed an LDL of 157. TSH normal. Patient does have diabetes mellitus. She has cerebrovascular disease. She denies dyspnea, chest pain, palpitations, syncope or pedal edema.  Current Outpatient Prescriptions  Medication Sig Dispense Refill  . aspirin 81 MG tablet Take 81 mg by mouth daily. Per patient, takes one 81 mg daily    . calcium carbonate (OS-CAL) 600 MG TABS tablet Take 600 mg by mouth 2 (two) times daily with a meal.    . colestipol (COLESTID) 1 G tablet Take 1 g by mouth 2 (two) times daily.    Marland Kitchen LORazepam (ATIVAN) 1 MG tablet Take 1 mg by mouth daily.    Marland Kitchen losartan (COZAAR) 100 MG tablet Take 100 mg by mouth daily.    . Multiple Vitamin (MULTIVITAMIN) tablet Take 1 tablet by mouth daily.    . Omega-3 Fatty Acids (FISH OIL PO) Take 1,400 mg by mouth daily.     Marland Kitchen omeprazole (PRILOSEC) 20 MG capsule Take 20 mg by mouth daily.    . polyethylene glycol (MIRALAX / GLYCOLAX) packet Take 17 g by mouth daily.    . VOLTAREN 1 % GEL Apply 2 g topically daily as needed. For pain    . LYRICA 50 MG capsule Take 1 tablet by mouth daily. Take 1 tab daily as needed for back pain    . meloxicam (MOBIC) 7.5 MG tablet Take 1 tablet by mouth daily. Take 1 tab daily     No  current facility-administered medications for this visit.    Allergies  Allergen Reactions  . Plavix [Clopidogrel Bisulfate] Other (See Comments)    Shakes and teeth chattering, "just didn't feel right"  . Prinivil [Lisinopril] Swelling    Lip swelling  . Sulfa Antibiotics Hives     Past Medical History  Diagnosis Date  . Hyperlipidemia   . Osteoarthritis of cervical spine   . History of shingles 11-2008  . Stroke ([place]) 12-2009  . Carotid artery occlusion   . Kidney stones 2012  . Hypertension   . Allergy   . Diabetes mellitus without complication [facility])     Past Surgical History  Procedure Laterality Date  . Hernia repair  1963    left side  . Abdominal hysterectomy  1973  . Lumbar disc surgery  1975  . Gallbladder surgery  1988    large stone blockage  . Ankle reconstruction  1990    right - 5 screws & plate  . Rotator cuff repair  1994    right  . Spine surgery  2000    C4-C5 fusion  . Stomach surgery  2002    mass rt & left (not cancerous)  . Spine surgery  2003    C6-C7 fusion  . Foot fusion  2005    left - 4 screws  . Spine surgery  2010    replace broken plate (  cervical region)  . Spinal fusion surgery  2000    C4 and C5  . Spinal fusion surgery  2003, 2010    C6 and C7  . Cholecystectomy  1988  . Lumbar epidural injection  May-Aug-Sept. 2015/ 08-29-15    Social History   Social History  . Marital Status: Married    Spouse Name: Pilar Plate   . Number of Children: 3  . Years of Education: 12   Occupational History  . Not on file.   Social History Main Topics  . Smoking status: Former Smoker -- 1.00 packs/day for 3 years    Types: Cigarettes    Quit date: 02/08/1979  . Smokeless tobacco: Never Used  . Alcohol Use: No  . Drug Use: No  . Sexual Activity: Not on file   Other Topics Concern  . Not on file   Social History Narrative   Lives at home with husband, Pilar Plate.   Caffeine use: 2 cups coffee per day.    Family History  Problem  Relation Age of Onset  . Dementia Mother   . Heart attack Father     ROS: no fevers or chills, productive cough, hemoptysis, dysphasia, odynophagia, melena, hematochezia, dysuria, hematuria, rash, seizure activity, orthopnea, PND, pedal edema, claudication. Remaining systems are negative.  Physical Exam:   Blood pressure 154/100, pulse 88, height 4\' 10"  (1.473 m), weight 146 lb 4 oz (66.339 kg).  General:  Well developed/well nourished in NAD Skin warm/dry Patient not depressed No peripheral clubbing Back-normal HEENT-normal/normal eyelids Neck supple/normal carotid upstroke bilaterally; no bruits; no JVD; no thyromegaly chest - CTA/ normal expansion CV - RRR/normal S1 and S2; no murmurs, rubs or gallops;  PMI nondisplaced Abdomen -NT/ND, no HSM, no mass, + bowel sounds, no bruit 2+ femoral pulses, no bruits Ext-no edema, chords, 2+ DP Neuro-grossly nonfocal  ECG 10/15/2015. Normal sinus rhythm, no ST changes.

## 2016-01-02 ENCOUNTER — Ambulatory Visit (INDEPENDENT_AMBULATORY_CARE_PROVIDER_SITE_OTHER): Payer: Medicare Other | Admitting: Cardiology

## 2016-01-02 ENCOUNTER — Encounter: Payer: Self-pay | Admitting: Cardiology

## 2016-01-02 VITALS — BP 154/100 | HR 88 | Ht <= 58 in | Wt 146.2 lb

## 2016-01-02 DIAGNOSIS — I10 Essential (primary) hypertension: Secondary | ICD-10-CM | POA: Diagnosis not present

## 2016-01-02 DIAGNOSIS — E785 Hyperlipidemia, unspecified: Secondary | ICD-10-CM

## 2016-01-02 DIAGNOSIS — I351 Nonrheumatic aortic (valve) insufficiency: Secondary | ICD-10-CM

## 2016-01-02 MED ORDER — EZETIMIBE 10 MG PO TABS: 10.0000 mg | ORAL_TABLET | Freq: Every day | ORAL | Status: DC

## 2016-01-02 NOTE — Assessment & Plan Note (Signed)
Mild to moderate normal recent echo.

## 2016-01-02 NOTE — Assessment & Plan Note (Signed)
Blood pressure is elevated today but she follows this at home and it is typically controlled. Continue present medications and follow.

## 2016-01-02 NOTE — Patient Instructions (Signed)
Medication Instructions:   START ZETIA 10 MG ONCE DAILY  Follow-Up:  Your physician wants you to follow-up in: [place] will receive a reminder letter in the mail two months in advance. If you don't receive a letter, please call our office to schedule the follow-up appointment.   APPOINTMENT WITH LIPID CLINIC TO DISCUSS NEW AGENTS  If you need a refill on your cardiac medications before your next appointment, please call your pharmacy.

## 2016-01-02 NOTE — Assessment & Plan Note (Signed)
Patient has diabetes mellitus and documented vascular disease. Her LDL is elevated. She has been intolerant of all statins. I have given her Zetia 10 mg daily. I will refer to lipid clinic for consideration of PCSK9 inhibitor.

## 2016-01-02 NOTE — Assessment & Plan Note (Signed)
Continue aspirin. Intolerant to statins. Follow-up carotid Doppler September 2017.

## 2016-01-07 ENCOUNTER — Encounter: Payer: Self-pay | Admitting: Neurology

## 2016-01-07 ENCOUNTER — Ambulatory Visit (INDEPENDENT_AMBULATORY_CARE_PROVIDER_SITE_OTHER): Payer: Medicare Other | Admitting: Neurology

## 2016-01-07 VITALS — BP 170/76 | HR 84 | Ht <= 58 in | Wt 148.5 lb

## 2016-01-07 DIAGNOSIS — R42 Dizziness and giddiness: Secondary | ICD-10-CM

## 2016-01-07 NOTE — Patient Instructions (Signed)
Overall you are doing fairly well but I do want to suggest a few things today:   Remember to drink plenty of fluid, eat healthy meals and do not skip any meals. Try to eat protein with a every meal and eat a healthy snack such as fruit or nuts in between meals. Try to keep a regular sleep-wake schedule and try to exercise daily, particularly in the form of walking, 20-30 minutes a day, if you can.   As far as diagnostic testing: Vestibular therapy  I would like to see you back in after vestibular therapy, sooner if we need to. Please call us with any interim questions, concerns, problems, updates or refill requests.   Our phone number is [phone]. We also have an after hours call service for urgent matters and there is a physician on-call for urgent questions. For any emergencies you know to call 911 or go to the nearest emergency room

## 2016-01-07 NOTE — Progress Notes (Signed)
[facility]    Provider:  Dr Jaynee Eagles Referring Provider: Derinda Late, MD Primary Care Physician:  Marylene Land, MD  CC: dizziness  Interval history Nov 07, 1936  The dizziness persists. The headaches are better since the patch. Blood patch was on October the 9th. She still has the dizziness. She feels lightheaded continuously. Even when sitting. She had food poisoning after eating a burger. She was having pain and went to the ED and she was treated for Diverticulitis. She was given cipro and flagyl. She had a reaction to the Flagyl. She stopped the Doxazosin. Her orthostatics are normal. She has been to ENT. She has been extensively tested. She went to Cardiology. She feels unsteady with the lightheadedness. She uses a walker. We discussed possible CT myelogram at length, but need to discuss with  Dr. Teryl Lucy (interventional radiology) and Dr. Sandi Mariscal if CT Myelogram would be beneficial to evaluate for csf leak.  HPI: [name] is a 80 y.o. female here as a referral from Dr. Sandi Mariscal for dizziness and presyncope. She has a past medical history of lumbosacral degenerative disease, hyperlipidemia, hypertension, fibromyalgia, cervical radiculopathy, migraines, left brachial artery thrombosis, vasovagal syncope, right carpal tunnel syndrome, intermittent benign positional vertigo, type 2 diabetes. She had an epidural steroid injection on September 2nd. Since then having headaches and dizziness. Extensive workup by Dr. Sandi Mariscal has been negative to date. She had carotid artery studies, echo, MRI and now is going to the ENT. She describes it as being lightheaded. No room spinning. Bending over makes her dizzy. About a week after the epidural steroid injection is when it all started. She is on Lyrica for her back pain but she has been on it for 4-5 years, no new medications or other known inciting factors except for the epidural steroid injection. No symptoms when she stands  up. Headaches are occasionally. Drinking coffee helps her headache go away. Headaches occur when standing or sitting, no headache when laying down. She sometimes wakes up from a deep sleep with a headache, she snores but she denies excessive daytime sleepiness and is very energetic. Headaches started in the last week. She feels like she is in a cloud, a terrible head cold floating around. She was walking from the living room to the kitchen and she was sitting and all of a sudden she felt like she was going to faint. Then she had the pins and needles in the left hand which have since resolved. The headaches are in the temples. No vision changes. No jaw claudication. Patient reports that she had similar symptoms after last epidural steroid injection in May 2016. At that time the symptoms lasted a few weeks. Denies dysarthria, dysphagia, aphasia, facial droop, new weakness, hearing changes, vision changes, neck stiffness, fever, chills or other systemic symptoms.  Reviewed notes, labs and imaging from outside physicians, which showed: Per Dr. Deboraha Sprang notes, patient had an epidural injection for her lumbar radiculopathy on 09/12/2015. Since then she's had frequent symptoms of dizziness. She's had presyncopal episodes, when she was bending forward. She sometimes feels to be worse with rapid changes of position of her head. No true vertigo.  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 Multiple studies recently included a Holter report which showed rare PVCs and PACs, and echocardiogram showed mild-to-moderate aortic regurgitation was otherwise unremarkable, and MRI and MRA of the brain scan showed an old right subcortical small vessel CVA and mild atrophy, there was  mild to moderate left P2 PCA stenosis. Carotid Doppler study showed stable 40-59% right ICA stenosis and less than 40% left ICA stenosis.  MRI of the brain 08/2015:  Personally reviewed images and agree with following. : 1. No acute intracranial abnormality or mass. 2. No medium or large vessel occlusion or significant proximal stenosis. Mild-to-moderate distal left P2 PCA stenosis. Appears artifactual. 3. Unchanged, tiny bulge along the left supraclinoid ICA. Also few small foci of T2 hyperintensity are again seen in the cerebral white matter which are nonspecific, and not greater than expected for patient's age  She has an allergy to Plavix and she is currently on aspirin.  Review of Systems: Patient complains of symptoms per HPI as well as the following symptoms: weight loss, fatigue, blurred vision, easy bruising, toruble swallowing, incontinence, joint pain, joint swelling, aching muscles, rash, birth marks, moles, incontinence, runny nose, memory loss, confusion, headahce, numbness, weakness, difficulty swallowing, too much sleeo, decreased sleep. Pertinent negatives per HPI. All others negative.    Social History   Social History  . Marital Status: Married    Spouse Name: Pilar Plate   . Number of Children: 3  . Years of Education: 12   Occupational History  . Not on file.   Social History Main Topics  . Smoking status: Former Smoker -- 1.00 packs/day for 3 years    Types: Cigarettes    Quit date: 02/08/1979  . Smokeless tobacco: Never Used  . Alcohol Use: No  . Drug Use: No  . Sexual Activity: Not on file   Other Topics Concern  . Not on file   Social History Narrative   Lives at home with husband, Pilar Plate.   Caffeine use: 2 cups coffee per day.    Family History  Problem Relation Age of Onset  . Dementia Mother   . Heart attack Father     Past Medical History  Diagnosis Date  . Hyperlipidemia   . Osteoarthritis of cervical spine   . History of shingles 11-2008  . Stroke ([place]) 12-2009  . Carotid artery occlusion   . Kidney stones 2012  . Hypertension   . Allergy   . Diabetes mellitus without complication [facility])      Past Surgical History  Procedure Laterality Date  . Hernia repair  1963    left side  . Abdominal hysterectomy  1973  . Lumbar disc surgery  1975  . Gallbladder surgery  1988    large stone blockage  . Ankle reconstruction  1990    right - 5 screws & plate  . Rotator cuff repair  1994    right  . Spine surgery  2000    C4-C5 fusion  . Stomach surgery  2002    mass rt & left (not cancerous)  . Spine surgery  2003    C6-C7 fusion  . Foot fusion  2005    left - 4 screws  . Spine surgery  2010    replace broken plate (cervical region)  . Spinal fusion surgery  2000    C4 and C5  . Spinal fusion surgery  2003, 2010    C6 and C7  . Cholecystectomy  1988  . Lumbar epidural injection  May-Aug-Sept. 2015/ 08-29-15    Current Outpatient Prescriptions  Medication Sig Dispense Refill  . aspirin 81 MG tablet Take 81 mg by mouth daily. Per patient, takes one 81 mg daily    . calcium carbonate (OS-CAL) 600 MG TABS tablet Take 600 mg by  mouth 2 (two) times daily with a meal.    . colestipol (COLESTID) 1 G tablet Take 1 g by mouth 2 (two) times daily.    Marland Kitchen ezetimibe (ZETIA) 10 MG tablet Take 1 tablet (10 mg total) by mouth daily. 90 tablet 3  . LORazepam (ATIVAN) 1 MG tablet Take 1 mg by mouth daily.    Marland Kitchen losartan (COZAAR) 100 MG tablet Take 100 mg by mouth daily.    Marland Kitchen LYRICA 50 MG capsule Take 1 tablet by mouth daily. Take 1 tab daily as needed for back pain    . meloxicam (MOBIC) 7.5 MG tablet Take 1 tablet by mouth daily as needed. Take 1 tab daily    . Multiple Vitamin (MULTIVITAMIN) tablet Take 1 tablet by mouth daily.    . Omega-3 Fatty Acids (FISH OIL PO) Take 1,400 mg by mouth daily.     Marland Kitchen omeprazole (PRILOSEC) 20 MG capsule Take 20 mg by mouth daily.    . polyethylene glycol (MIRALAX / GLYCOLAX) packet Take 17 g by mouth daily.    . VOLTAREN 1 % GEL Apply 2 g topically daily as needed. For pain     No current facility-administered medications for this visit.    Allergies  as of 01/07/2016 - Review Complete 01/07/2016  Allergen Reaction Noted  . Plavix [clopidogrel bisulfate] Other (See Comments) 02/09/2012  . Prinivil [lisinopril] Swelling 02/09/2012  . Sulfa antibiotics Hives 02/09/2012  . Metronidazole  01/07/2016    Vitals: BP 170/76 mmHg  Pulse 84  Ht '4\' 10"'  (1.473 m)  Wt 148 lb 8 oz (67.359 kg)  BMI 31.04 kg/m2 Last Weight:  Wt Readings from Last 1 Encounters:  01/07/16 148 lb 8 oz (67.359 kg)   Last Height:   Ht Readings from Last 1 Encounters:  01/07/16 '4\' 10"'  (1.473 m)     Cranial Nerves:  The pupils are equal, round, and reactive to light. Attempted fundoscopic exam, coul dnot visualize. Visual fields are full to finger confrontation. Extraocular movements are intact. Trigeminal sensation is intact and the muscles of mastication are normal. The face is symmetric. The palate elevates in the midline. Hearing intact. Voice is normal. Shoulder shrug is normal. The tongue has normal motion without fasciculations.   Coordination:  No dysmetria  Gait:: very mild shuffling, uses walker    Motor Observation:   No asymmetry, no atrophy, and no involuntary movements noted. Tone:  Normal muscle tone.   Posture:  Posture is normal. normal erect   Strength:  Right proximal UE weakness(chronic). Bilateral hip flexion weakness otherwise strength is V/V in the upper and lower limbs.    Sensation: intact to LT   Reflex Exam:  DTR's:  Deep tendon reflexes in the upper and lower extremities are brisk for age and medical conditions bilaterally.  Toes:  The toes are equivocal bilaterally.  Clonus:  Clonus is absent.     Assessment/Plan: 80 year old female who presents with positional dizziness and headache after receiving a lumbar epidural steroid injection. Symptoms are worse with changes in position of her head and no true vertigo. Similar symptoms back in May at her last steroid epidural injection.  MRI of the brain and MRA of the head were unremarkable. She has a past medical history of lumbosacral degenerative disease, hyperlipidemia, hypertension, fibromyalgia, cervical radiculopathy, migraines, left brachial artery thrombosis, vasovagal syncope, right carpal tunnel syndrome, intermittent benign positional vertigo, type 2 diabetes.  Extensive workup by Dr. Sandi Mariscal has been negative to date MRI, MRA, carotid artery  studies, echo, ekg, holter monitor.   Blood patch helped headache but not dizziness. Will refer to vestibular therapy and also discuss with Dr. Teryl Lucy (interventional radiology) and Dr. Sandi Mariscal if CT Myelogram would be beneficial to evaluate for csf leak.  Although rare in epidural steroid injections, the corticosteroids injection can cause side effects such as dizziness, headache, facial erythema, transient hypotension and hypertension, gastritis, mood swings, pruritus, insomnia. Patient is going to be evaluated by ENT, will follow results. Pending evaluation, may consider a blood patch for patient as could also be a very small csf leak causing headache and dizziness after ESI. She has had similar symptoms after last ESI as well which resolved on their own, can also follow clinically. Hydration and caffeine help.  BP today 210/85, called Dr. Deboraha Sprang office and will restart HTN medications that were on hold Continue ASA and statin for stroke prevention. Continue to follow with Dr. Sandi Mariscal for management of vascular risk factors. Esr and crp to eval for temporal arteritis due to temple pain and blurred vision on the right eye  Addendum from dr blomgren: b12 [id number], tsh 3.3    Sarina Ill, MD  [facility] [address] [place], [place] [address]  Phone [phone] Fax [phone]  A total of 40 minutes was spent face-to-face with this patient. Over half this time was spent on counseling patient on the dizzines diagnosis and different  diagnostic and therapeutic options available.

## 2016-01-09 ENCOUNTER — Encounter: Payer: Self-pay | Admitting: Emergency Medicine

## 2016-01-10 ENCOUNTER — Telehealth: Payer: Self-pay | Admitting: Neurology

## 2016-01-10 DIAGNOSIS — G9600 Cerebrospinal fluid leak, unspecified: Secondary | ICD-10-CM

## 2016-01-10 NOTE — Telephone Encounter (Signed)
Blood patch helped headache but not dizziness. Will refer to vestibular therapy and also discuss with Dr. Teryl Lucy (interventional radiology) and Dr. Sandi Mariscal if CT <yelogram would be beneficial to evaluate for csf leak.

## 2016-01-13 ENCOUNTER — Telehealth: Payer: Self-pay | Admitting: Neurology

## 2016-01-13 DIAGNOSIS — R42 Dizziness and giddiness: Secondary | ICD-10-CM

## 2016-01-13 NOTE — Telephone Encounter (Signed)
Patient is calling and asking about the vestibular therapy that doctor Jaynee Eagles is suggesting. She would like to know if that will be done here in our office or if a referral will be made for this.  Please call. Thanks!

## 2016-01-13 NOTE — Telephone Encounter (Signed)
Please place thanks

## 2016-01-13 NOTE — Telephone Encounter (Signed)
Spoke to [place] at [facility]. Relayed message below. Wants to get pt scheduled within the next few days before Dr Teryl Lucy leaves. She just scheduled last appt of Dr Teryl Lucy. He has nothing available. She is going to check with him to see what she can do. She will call me back tomorrow morning around 8am. She verified she can see orders placed.

## 2016-01-13 NOTE — Telephone Encounter (Signed)
Can you place an order for CT Myelogram for this patient for evaluation of csf leak. Dr. Teryl Lucy is here the next few days and he wants to do the procedure. Any way we can get this ordered within the next day and see if we can get it scheduled? Def want Dr. Fredderick Severance to do it, I have already discussed. If we cant get it in the next 2 days we will have to wait 3 weeks until Dr. Teryl Lucy gets back in 3 weeks which is ok too.

## 2016-01-13 NOTE — Telephone Encounter (Signed)
Spoke to Ms. [place]. She is fine with waiting several weeks until Dr. Teryl Lucy returns for the CT myelogram. We will order the CT myelogram and get it approved for a few weeks from now. She will do vestibular therapy in the meantime.

## 2016-01-13 NOTE — Addendum Note (Signed)
Addended by: Hope Pigeon on: 01/13/2016 04:35 PM   Modules accepted: Orders

## 2016-01-13 NOTE — Telephone Encounter (Signed)
Per Dr Jaynee Eagles other phone note "Spoke to Ms. Takacs. She is fine with waiting several weeks until Dr. Teryl Lucy returns for the CT myelogram. We will order the CT myelogram and get it approved for a few weeks from now. She will do vestibular therapy in the meantime."

## 2016-01-13 NOTE — Telephone Encounter (Signed)
Dr Jaynee Eagles- pt calling about vestibular therapy. I looked and your AVS mentions this but I do not see a referral? Do you want one placed?

## 2016-01-13 NOTE — Telephone Encounter (Signed)
Placed referral to physical therapy for vestibular therapy.

## 2016-01-14 NOTE — Telephone Encounter (Signed)
Called Caroline back at GI. She stated they were able to fit pt in on Thursday for CT Myelogram. Dr Teryl Lucy will be there that day.

## 2016-01-15 ENCOUNTER — Ambulatory Visit
Admission: RE | Admit: 2016-01-15 | Discharge: 2016-01-15 | Disposition: A | Discharge status: Home / Self Care | Payer: Medicare Other | Source: Ambulatory Visit | Attending: Neurology | Admitting: Neurology

## 2016-01-15 DIAGNOSIS — G9600 Cerebrospinal fluid leak, unspecified: Secondary | ICD-10-CM

## 2016-01-15 MED ORDER — IOHEXOL 300 MG/ML  SOLN
10.0000 mL | Freq: Once | INTRAMUSCULAR | Status: AC | PRN
Start: 2016-01-15 — End: 2016-01-15
  Administered 2016-01-15: 10 mL via INTRATHECAL

## 2016-01-15 NOTE — Telephone Encounter (Signed)
thanks

## 2016-01-15 NOTE — Discharge Instructions (Signed)

## 2016-01-16 ENCOUNTER — Telehealth: Payer: Self-pay | Admitting: Radiology

## 2016-01-16 NOTE — Telephone Encounter (Signed)
Pt's husband left a message on answering machine that Kyrstal was very dizzy post myelo yesterday till she couldn't hardly walk. Spoke with husband this morning. Explained we may have aggravated whatever was causing her dizziness in the first place and to please make Dr. Jaynee Eagles aware when they followed up with her.

## 2016-01-20 ENCOUNTER — Other Ambulatory Visit: Payer: Self-pay | Admitting: Neurology

## 2016-01-20 ENCOUNTER — Telehealth: Payer: Self-pay | Admitting: Neurology

## 2016-01-20 DIAGNOSIS — R42 Dizziness and giddiness: Secondary | ICD-10-CM

## 2016-01-20 NOTE — Telephone Encounter (Signed)
Called pt and advised results not ready yet. Will forward message to Dr Jaynee Eagles. Also advised we sent referral to PT last week. Told her to call if she does not hear by this week. She verbalized understanding.

## 2016-01-20 NOTE — Telephone Encounter (Signed)
Patient called to request results of 01/15/16 CT Myelogram.

## 2016-01-20 NOTE — Telephone Encounter (Signed)
Called patient, no csf leak was seen on CT myelogram.

## 2016-01-27 ENCOUNTER — Ambulatory Visit: Payer: Medicare Other | Attending: Neurology | Admitting: Rehabilitative and Restorative Service Providers"

## 2016-01-27 DIAGNOSIS — R42 Dizziness and giddiness: Secondary | ICD-10-CM | POA: Insufficient documentation

## 2016-01-27 DIAGNOSIS — R269 Unspecified abnormalities of gait and mobility: Secondary | ICD-10-CM | POA: Insufficient documentation

## 2016-01-27 NOTE — Patient Instructions (Signed)
  Gaze Stabilization: Tip Card 1.Target must remain in focus, not blurry, and appear stationary while head is in motion. 2.Perform exercises with small head movements (45 to either side of midline). 3.Increase speed of head motion so long as target is in focus. 4.If you wear eyeglasses, be sure you can see target through lens (therapist will give specific instructions for bifocal / progressive lenses). 5.These exercises may provoke dizziness or nausea. Work through these symptoms. If too dizzy, slow head movement slightly. Rest between each exercise. 6.Exercises demand concentration; avoid distractions. 7.For safety, perform standing exercises close to a counter, wall, corner, or next to someone.  Copyright  VHI. All rights reserved.  Gaze Stabilization: Standing Feet Apart   START SITTING FOR 10 TIMES OF HEAD MOTION.   Work up to 30 seconds.  Feet shoulder width apart, keeping eyes on target on wall 3 feet away, tilt head down slightly and move head side to side for 30 seconds.  Do 2 sessions per day.   Copyright  VHI. All rights reserved.

## 2016-01-27 NOTE — Therapy (Signed)
[facility] [address] [place], [place], [address] Phone: [phone]   Fax:  [phone]  Physical Therapy Evaluation  Patient Details  Name: [name] MRN: [id number] Date of Birth: January 08, 1936 Referring Provider: Heide Spark, MD  Encounter Date: 01/27/2016      PT End of Session - 01/27/16 1431    Visit Number 1   Number of Visits 16   Date for PT Re-Evaluation 03/27/16   Authorization Type G code every 10th visit   PT Start Time 1108   PT Stop Time 1155   PT Time Calculation (min) 47 min   Equipment Utilized During Treatment Gait belt   Activity Tolerance Patient tolerated treatment well   Behavior During Therapy [facility] for tasks assessed/performed      Past Medical History  Diagnosis Date  . Hyperlipidemia   . Osteoarthritis of cervical spine   . History of shingles 11-2008  . Stroke ([facility]) 12-2009  . Carotid artery occlusion   . Kidney stones 2012  . Hypertension   . Allergy   . Diabetes mellitus without complication [facility])     Past Surgical History  Procedure Laterality Date  . Hernia repair  1963    left side  . Abdominal hysterectomy  1973  . Lumbar disc surgery  1975  . Gallbladder surgery  1988    large stone blockage  . Ankle reconstruction  1990    right - 5 screws & plate  . Rotator cuff repair  1994    right  . Spine surgery  2000    C4-C5 fusion  . Stomach surgery  2002    mass rt & left (not cancerous)  . Spine surgery  2003    C6-C7 fusion  . Foot fusion  2005    left - 4 screws  . Spine surgery  2010    replace broken plate (cervical region)  . Spinal fusion surgery  2000    C4 and C5  . Spinal fusion surgery  2003, 2010    C6 and C7  . Cholecystectomy  1988  . Lumbar epidural injection  May-Aug-Sept. 2015/ 08-29-15    There were no vitals filed for this visit.  Visit Diagnosis:  Abnormality of gait  Dizziness and giddiness      Subjective Assessment -  01/27/16 1111    Subjective The patient reports she had dizziness that began gradually in 07/2015.  No prior report of dizziness. She desnies spinning sensation, she instead notes "dizziness and unsteadiness, blurred vision".    Head feels stuffed up when she moves.    Pertinent History She notes a back injection with possible CSF leak.  She had a blood patch to treat CSF leaks, which helped the headaches but not the dizziness.  CVA in Dec 2010.   Patient Stated Goals Get my balance back and get rid of the RW.   Currently in Pain? No/denies            [facility] PT Assessment - 01/27/16 1116    Assessment   Medical Diagnosis dizziness   Referring Provider Heide Spark, MD   Onset Date/Surgical Date --  August 2016   Prior Therapy h/o CVA with therapy needed   Precautions   Precautions Fall   Balance Screen   Has the patient fallen in the past 6 months No   Has the patient had a decrease in activity level because of a fear of falling?  Yes   Is  the patient reluctant to leave their home because of a fear of falling?  Yes   [place] Private residence   Living Arrangements Spouse/significant other   Home Access Stairs to enter   Entrance Stairs-Number of Steps 3   Entrance Stairs-Rails Can reach both   [place] One level   Prior Function   Level of Independence --  patient has limited mobility   ROM / Strength   AROM / PROM / Strength AROM   AROM   Overall AROM Comments Significantly reduced neck ROM with h/o surgery.   Ambulation/Gait   Ambulation/Gait Yes   Ambulation/Gait Assistance 6: Modified independent (Device/Increase time)   Ambulation Distance (Feet) 200 Feet   Assistive device Rolling walker   Gait Pattern Decreased step length - right;Decreased step length - left   Ambulation Surface Level   Gait velocity 2.24 ft/sec   High Level Balance   High Level Balance Comments Patient is able to stand eyes open + feet together x 30 seconds.  She  can perform with eyes closed with increased sway.  She is unable to perform single limb stance and tandem stance activities.  PT to further assess balance.            Vestibular Assessment - 01/27/16 1120    Vestibular Assessment   General Observation patient reports h/o ocular migraines, no headache.    Symptom Behavior   Type of Dizziness Imbalance  blurred vision, head feels funny   Frequency of Dizziness daily   Duration of Dizziness depends on activities   Aggravating Factors Turning body quickly;Turning head quickly;Activity in general  bending forward, can occur when seated still as well   Relieving Factors Head stationary   Occulomotor Exam   Occulomotor Alignment Abnormal  R eye hypertropia/ L skew eye deviation   Spontaneous Absent   Gaze-induced Absent   Head shaking Horizontal Absent   Smooth Pursuits Intact   Saccades --  patient takes 2 eye movements to get to target (still WNLs)   Comment Used frenzel lenses to observe for spontaenous nytagmus, head shaking.  No nystagmus viewed.   Vestibulo-Occular Reflex   VOR 1 Head Only (x 1 viewing) Had patient perform at self regulated pace due to neck tightness.  Could not perform head impulse test   Positional Testing   Dix-Hallpike Dix-Hallpike Right;Dix-Hallpike Left   Horizontal Canal Testing Horizontal Canal Right;Horizontal Canal Left   Dix-Hallpike Right   Dix-Hallpike Right Duration none   Dix-Hallpike Right Symptoms No nystagmus   Dix-Hallpike Left   Dix-Hallpike Left Duration none   Dix-Hallpike Left Symptoms No nystagmus   Horizontal Canal Right   Horizontal Canal Right Duration none   Horizontal Canal Right Symptoms Normal   Horizontal Canal Left   Horizontal Canal Left Duration none   Horizontal Canal Left Symptoms Normal                Vestibular Treatment/Exercise - 01/27/16 0001    Vestibular Treatment/Exercise   Vestibular Treatment Provided Gaze   Gaze Exercises X1 Viewing Horizontal    X1 Viewing Horizontal   Foot Position seated   Reps --  to tolerance, working up to 30 seconds   Comments patient needs cues to keep eyes fixated on the target               PT Education - 01/27/16 1357    Education provided Yes   Education Details HEP: gaze x 1 viewing seated  Person(s) Educated Patient   Methods Explanation;Demonstration;Handout   Comprehension Verbalized understanding;Returned demonstration          PT Short Term Goals - 01/27/16 1432    PT SHORT TERM GOAL #1   Title The patient will be indep with HEP for habituation, gaze, balance and general mobility.   Baseline Target date: 02/26/2016   Time 4   Period Weeks   PT SHORT TERM GOAL #2   Title The patient will tolerate gaze x 1 viewing x 30 seconds without subjective reports of dizziness.   Baseline Target date: 02/26/2016   Time 4   Period Weeks   PT SHORT TERM GOAL #3   Title The patient will be further assessed on Berg balance scale and will improve by 4 points from baseline.   Baseline Target date: 02/26/2016   Time 4   Period Weeks   PT SHORT TERM GOAL #4   Title The patient will improve gait speed from 2.24 ft/sec to > or equal to 2.6 ft/sec to demo improved community mobility.   Baseline Target date: 02/26/2016   Time 4   Period Weeks           PT Long Term Goals - 01/27/16 1434    PT LONG TERM GOAL #1   Title The patient will be indep with progression of HEP for post d/c.   Baseline Target date 03/27/2016   Time 8   Period Weeks   PT LONG TERM GOAL #2   Title The patient will reduce dizziness handicap index from 58% to < or equal to 42% to demo decreased self perception of dizziness.   Baseline Target date 03/27/2016   Time 8   Period Weeks   PT LONG TERM GOAL #3   Title The patient will ambulate for short distances (100 ft) on level, well lit surfaces without a device independently for household mobility.   Baseline Target date 03/27/2016   Time 8   Period Weeks   PT LONG TERM  GOAL #4   Title The patient will improve Berg score 6 or greater points from baseline measure.   Baseline Target date 03/27/2016   Time 8   Period Weeks               Plan - 01/27/16 1437    Clinical Impression Statement The patient is a 80 year old female presenting to outpatient therapy with h/o CVA, possible CSF leak reporting dizziness and imbalance since August 2016.  At today's evaluation, she demonstrated decreased neck ROM (h/o neck surgery), decreased tolerance to eye/head motion, general dizziness with bending forward and imbalance.  PT did not provoke positional symptoms and was limited in VOR testing due to neck ROM limitations.  PT initiated HEP for gaze and will continue to assess dizziness while progressing activities for balance and mobility to improve optimize function.    Pt will benefit from skilled therapeutic intervention in order to improve on the following deficits Abnormal gait;Decreased activity tolerance;Decreased balance;Dizziness;Postural dysfunction;Decreased range of motion;Difficulty walking   Rehab Potential Good   PT Frequency 2x / week   PT Duration 8 weeks   PT Treatment/Interventions Therapeutic exercise;Therapeutic activities;Functional mobility training;Gait training;Stair training;Patient/family education;Vestibular;Canalith Repostioning;ADLs/Self Care Home Management;Balance training;Neuromuscular re-education   PT Next Visit Plan Check gaze, ASSESS BERG, progress further HEP (habituation of dizziness, neck ROM, balance), gait without device when able.   Consulted and Agree with Plan of Care Patient          G-Codes -  01/27/16 1627    Functional Assessment Tool Used Gait speed=2.24 ft/sec   Functional Limitation Mobility: Walking and moving around   Mobility: Walking and Moving Around Current Status [phone]) At least 40 percent but less than 60 percent impaired, limited or restricted   Mobility: Walking and Moving Around Goal Status [phone]) At  least 20 percent but less than 40 percent impaired, limited or restricted       Problem List Patient Active Problem List   Diagnosis Date Noted  . Hyperlipidemia 01/02/2016  . Essential hypertension 01/02/2016  . Aortic insufficiency 01/02/2016  . Dizziness 09/10/2015  . Carotid artery stenosis 10/03/2012  . CAROTID ARTERY DISEASE 02/19/2011  . CVA 03/02/2010  . DEEP VENOUS THROMBOPHLEBITIS, LEG, LEFT 03/02/2010  . DEGENERATIVE DISC DISEASE, CERVICAL SPINE 03/02/2010  . DEGENERATIVE DISC DISEASE, LUMBAR SPINE 03/02/2010  . SHINGLES 02/08/2010  . DISCITIS 02/08/2010    Sanii Kukla, PT 01/27/2016, 4:27 PM  [facility] [address] [place], [place], [address] Phone: [phone]   Fax:  [phone]  Name: [name] MRN: [id number] Date of Birth: 05/18/1936

## 2016-02-02 ENCOUNTER — Ambulatory Visit (INDEPENDENT_AMBULATORY_CARE_PROVIDER_SITE_OTHER): Payer: Medicare Other | Admitting: Pharmacist Clinician (PhC)/ Clinical Pharmacy Specialist

## 2016-02-02 VITALS — Ht <= 58 in | Wt 145.3 lb

## 2016-02-02 DIAGNOSIS — E785 Hyperlipidemia, unspecified: Secondary | ICD-10-CM

## 2016-02-02 NOTE — Patient Instructions (Signed)
Continue with Zetia and Colestid   At the beginning of March go to [facility] and get cholesterol labs drawn.  Cholesterol Cholesterol is a fat. Your body needs a small amount of cholesterol. Cholesterol may build up in your blood vessels. This increases your chance of having a heart attack or stroke. You cannot feel your cholesterol levels. The only way to know your cholesterol level is high is with a blood test. Keep your test results. Work with your doctor to keep your cholesterol at a good level. WHAT DO THE TEST RESULTS MEAN?  Total cholesterol is how much cholesterol is in your blood.  LDL is bad cholesterol. This is the type that can build up. You want LDL to be low.  HDL is good cholesterol. It cleans your blood vessels and carries LDL away. You want HDL to be high.  Triglycerides are fat that the body can burn for energy or store. WHAT ARE GOOD LEVELS OF CHOLESTEROL?  Total cholesterol below 200.  LDL below 100 for people at risk. Below 70 for those at very high risk.  HDL above 50 is good. Above 60 is best.  Triglycerides below 150. HOW CAN I LOWER MY CHOLESTEROL?  Diet. Follow your diet programs as told by your doctor.  Choose fish, white meat chicken, roasted [place], or baked [place]. Try not to eat red meat, fried foods, or processed meats such as sausage and lunch meats.  Eat lots of fresh fruits and vegetables.  Choose whole grains, beans, pasta, potatoes, and cereals.  Use only small amounts of olive, corn, or canola oils.  Try not to eat butter, mayonnaise, shortening, or palm kernel oils.  Try not to eat foods with trans fats.  Drink skim or nonfat milk. Eat low-fat or nonfat yogurt and cheeses. Try not to drink whole milk or cream. Try not to eat ice cream, egg yolks, and full-fat cheeses.  Healthy desserts include angel food cake, ginger snaps, animal crackers, hard candy, popsicles, and low-fat or nonfat frozen yogurt. Try not to eat pastries, cakes, pies,  and cookies.  Exercise. Follow your exercise programs as told by your doctor.  Be more active. You can try gardening, walking, or taking the stairs. Ask your doctor about how you can be more active.  Medicine. Take medicine as told by your doctor.   This information is not intended to replace advice given to you by your health care provider. Make sure you discuss any questions you have with your health care provider.   Document Released: 03/11/2009 Document Revised: 01/03/2015 Document Reviewed: 09/26/2013 Elsevier Interactive Patient Education [employer].

## 2016-02-03 ENCOUNTER — Ambulatory Visit: Payer: Medicare Other

## 2016-02-03 ENCOUNTER — Encounter: Payer: Self-pay | Admitting: Pharmacist Clinician (PhC)/ Clinical Pharmacy Specialist

## 2016-02-03 NOTE — Progress Notes (Addendum)
02/03/2016 [name] May 21, 1936 [id number]   HPI:  [name] is a 80 y.o. female patient of Dr Stanford Breed, who presents today for a lipid clinic evaluation.  Dr. Stanford Breed reports that her last LDL was at 157, with just colestipol and fish oil therapy.  She states to have a history of intolerance to statin drugs, reports they caused myalgias in her legs, ankles and feet.   RF:  Grade 1 diastolic dysfunction, DM controlled by lifestyle changes, cerebrovascular disease with stroke in 2011  Meds:   ezetimibe 10 mg qd (started 01-03-16), Colestipol 1 gm bid, fish oil 1 qd,   Intolerant/previously tried: statins, will contact Dr. Sandi Mariscal for this history  Family history:  Father had "several" MI's, died at 55, one 43 with DM, daughter with lupus  Diet: eats most meals at home, plenty of fruits and vegetables, has been losing weight to better control DM, eats oatmeal most mornings, soups and salads regularly  Exercise:  Cannot walk far, but does "laps" in her hallway and on back patio, does not like to sit  Labs:  None in system at this time   Current Outpatient Prescriptions  Medication Sig Dispense Refill  . aspirin 81 MG tablet Take 81 mg by mouth daily. Per patient, takes one 81 mg daily    . calcium carbonate (OS-CAL) 600 MG TABS tablet Take 600 mg by mouth 2 (two) times daily with a meal.    . colestipol (COLESTID) 1 G tablet Take 1 g by mouth 2 (two) times daily.    Marland Kitchen ezetimibe (ZETIA) 10 MG tablet Take 1 tablet (10 mg total) by mouth daily. 90 tablet 3  . LORazepam (ATIVAN) 1 MG tablet Take 1 mg by mouth daily.    Marland Kitchen losartan (COZAAR) 100 MG tablet Take 100 mg by mouth daily.    Marland Kitchen LYRICA 50 MG capsule Take 1 tablet by mouth daily. Take 1 tab daily as needed for back pain    . meloxicam (MOBIC) 7.5 MG tablet Take 1 tablet by mouth daily as needed. Take 1 tab daily    . Multiple Vitamin (MULTIVITAMIN) tablet Take 1 tablet by mouth daily.    . Omega-3 Fatty Acids (FISH OIL PO)  Take 1,400 mg by mouth daily.     Marland Kitchen omeprazole (PRILOSEC) 20 MG capsule Take 20 mg by mouth daily.    . polyethylene glycol (MIRALAX / GLYCOLAX) packet Take 17 g by mouth daily.    . VOLTAREN 1 % GEL Apply 2 g topically daily as needed. For pain     No current facility-administered medications for this visit.    Allergies  Allergen Reactions  . Plavix [Clopidogrel Bisulfate] Other (See Comments)    Shakes and teeth chattering, "just didn't feel right"  . Prinivil [Lisinopril] Swelling    Lip swelling  . Sulfa Antibiotics Hives  . Metronidazole     She got very sick    Past Medical History  Diagnosis Date  . Hyperlipidemia   . Osteoarthritis of cervical spine   . History of shingles 11-2008  . Stroke ([place]) 12-2009  . Carotid artery occlusion   . Kidney stones 2012  . Hypertension   . Allergy   . Diabetes mellitus without complication ([facility])     Height 4\' 10"  (1.473 m), weight 145 lb 4.8 oz (65.908 kg).     Tommy Medal PharmD CPP [facility]

## 2016-02-03 NOTE — Assessment & Plan Note (Addendum)
Patient with history of hyperlipidemia, just recently started ezetimibe 10 mg.  Reports no problems with this.  Explained PCSK-9 treatment to patient, she is willing to see if affordable on her insurance.  Explained that we need to get some history from Dr. Sandi Mariscal regarding her past statin use, as well as an updated cholesterol panel once she has been on ezetimibe for at least 2 months.  She will go back to lab at beginning of March for fasting labs and based on those we will apply for Repatha.  Patient would prefer to use the once monthly formulation.

## 2016-02-06 ENCOUNTER — Ambulatory Visit: Payer: Medicare Other | Attending: Neurology

## 2016-02-06 DIAGNOSIS — R42 Dizziness and giddiness: Secondary | ICD-10-CM | POA: Insufficient documentation

## 2016-02-06 DIAGNOSIS — R269 Unspecified abnormalities of gait and mobility: Secondary | ICD-10-CM | POA: Insufficient documentation

## 2016-02-06 NOTE — Patient Instructions (Signed)
Perform in a corner with a chair in front of you for safety:  Feet Together, Head Motion - Eyes Open    With eyes open, feet together, move head slowly: up and down 10 times and side to side 10 times. Repeat _3___ times per session. Do __1__ sessions per day.  Copyright  VHI. All rights reserved.  Feet Together, Varied Arm Positions - Eyes Closed    Stand with feet together and arms at your side Close eyes and visualize upright position. Hold _30___ seconds. Repeat __3__ times per session. Do __1__ sessions per day.  Copyright  VHI. All rights reserved.  Feet Heel-Toe "Tandem", Varied Arm Positions - Eyes Open    With eyes open, right foot directly in front of the other, arms at your side, look straight ahead at a stationary object. Hold __30__ seconds. Then repeat with left foot in front. Repeat __3__ times per session. Do __1__ sessions per day.  Copyright  VHI. All rights reserved.

## 2016-02-06 NOTE — Therapy (Signed)
[facility] [address] [place], [place], [address] Phone: [phone]   Fax:  [phone]  Physical Therapy Treatment  Patient Details  Name: [name] MRN: [id number] Date of Birth: 1936-01-30 Referring Provider: Heide Spark, MD  Encounter Date: 02/06/2016      PT End of Session - 02/06/16 1313    Visit Number 2   Number of Visits 16   Date for PT Re-Evaluation 03/27/16   Authorization Type G code every 10th visit   PT Start Time 1103   PT Stop Time 1144   PT Time Calculation (min) 41 min   Equipment Utilized During Treatment Gait belt   Activity Tolerance Patient tolerated treatment well   Behavior During Therapy [facility] for tasks assessed/performed      Past Medical History  Diagnosis Date  . Hyperlipidemia   . Osteoarthritis of cervical spine   . History of shingles 11-2008  . Stroke ([place]) 12-2009  . Carotid artery occlusion   . Kidney stones 2012  . Hypertension   . Allergy   . Diabetes mellitus without complication [facility])     Past Surgical History  Procedure Laterality Date  . Hernia repair  1963    left side  . Abdominal hysterectomy  1973  . Lumbar disc surgery  1975  . Gallbladder surgery  1988    large stone blockage  . Ankle reconstruction  1990    right - 5 screws & plate  . Rotator cuff repair  1994    right  . Spine surgery  2000    C4-C5 fusion  . Stomach surgery  2002    mass rt & left (not cancerous)  . Spine surgery  2003    C6-C7 fusion  . Foot fusion  2005    left - 4 screws  . Spine surgery  2010    replace broken plate (cervical region)  . Spinal fusion surgery  2000    C4 and C5  . Spinal fusion surgery  2003, 2010    C6 and C7  . Cholecystectomy  1988  . Lumbar epidural injection  May-Aug-Sept. 2015/ 08-29-15    There were no vitals filed for this visit.  Visit Diagnosis:  Dizziness and giddiness  Abnormality of gait      Subjective Assessment -  02/06/16 1106    Subjective Pt denied falls or changes since last visit.  Pt amb. to session with SPC.  Pt reported she had a slight HA while performing gaze HEP this morning, but it did not last long. Pt reported she had to cancel her eye appt. due to illness, so she needs to reschedule, and she believes this could contribute to dizziness. Pt reported 5/10 dizziness during gaze HEP, which has decr. since first performing HEP (10/10).    Pertinent History She notes a back injection with possible CSF leak.  She had a blood patch to treat CSF leaks, which helped the headaches but not the dizziness.  CVA in Dec 2010.   Patient Stated Goals Get my balance back and get rid of the RW.   Currently in Pain? No/denies                         [facility] Adult PT Treatment/Exercise - 02/06/16 1116    Standardized Balance Assessment   Standardized Balance Assessment Berg Balance Test   Berg Balance Test   Sit to Stand Able to stand without using  hands and stabilize independently   Standing Unsupported Able to stand safely 2 minutes   Sitting with Back Unsupported but Feet Supported on Floor or Stool Able to sit safely and securely 2 minutes   Stand to Sit Sits safely with minimal use of hands   Transfers Able to transfer safely, minor use of hands   Standing Unsupported with Eyes Closed Able to stand 10 seconds with supervision   Standing Ubsupported with Feet Together Able to place feet together independently and stand for 1 minute with supervision   From Standing, Reach Forward with Outstretched Arm Can reach forward >12 cm safely (5")  5" with R UE, hx of R rotator cuff sx   From Standing Position, Pick up Object from Floor Able to pick up shoe, needs supervision   From Standing Position, Turn to Look Behind Over each Shoulder Looks behind one side only/other side shows less weight shift   Turn 360 Degrees Able to turn 360 degrees safely but slowly   Standing Unsupported, Alternately Place  Feet on Step/Stool Able to complete 4 steps without aid or supervision   Standing Unsupported, One Foot in Front Able to take small step independently and hold 30 seconds   Standing on One Leg Tries to lift leg/unable to hold 3 seconds but remains standing independently   Total Score 42      Pt performed and reviewed seated gaze HEP and new balance HEP. Min guard to S for safety, performed in // bars with chair for safety. Cues for technique. Please see pt instructions for details.          PT Education - 02/06/16 1312    Education provided Yes   Education Details PT reviewed gaze HEP and provided pt with cues for chin tuck and to incr. speed as tolerated. PT also added addtional balance HEP.   Person(s) Educated Patient   Methods Explanation;Demonstration;Verbal cues;Handout   Comprehension Verbalized understanding;Returned demonstration;Need further instruction          PT Short Term Goals - 01/27/16 1432    PT SHORT TERM GOAL #1   Title The patient will be indep with HEP for habituation, gaze, balance and general mobility.   Baseline Target date: 02/26/2016   Time 4   Period Weeks   PT SHORT TERM GOAL #2   Title The patient will tolerate gaze x 1 viewing x 30 seconds without subjective reports of dizziness.   Baseline Target date: 02/26/2016   Time 4   Period Weeks   PT SHORT TERM GOAL #3   Title The patient will be further assessed on Berg balance scale and will improve by 4 points from baseline.   Baseline Target date: 02/26/2016   Time 4   Period Weeks   PT SHORT TERM GOAL #4   Title The patient will improve gait speed from 2.24 ft/sec to > or equal to 2.6 ft/sec to demo improved community mobility.   Baseline Target date: 02/26/2016   Time 4   Period Weeks           PT Long Term Goals - 01/27/16 1434    PT LONG TERM GOAL #1   Title The patient will be indep with progression of HEP for post d/c.   Baseline Target date 03/27/2016   Time 8   Period Weeks   PT LONG  TERM GOAL #2   Title The patient will reduce dizziness handicap index from 58% to < or equal to 42% to demo  decreased self perception of dizziness.   Baseline Target date 03/27/2016   Time 8   Period Weeks   PT LONG TERM GOAL #3   Title The patient will ambulate for short distances (100 ft) on level, well lit surfaces without a device independently for household mobility.   Baseline Target date 03/27/2016   Time 8   Period Weeks   PT LONG TERM GOAL #4   Title The patient will improve Berg score 6 or greater points from baseline measure.   Baseline Target date 03/27/2016   Time 8   Period Weeks               Plan - 02/06/16 1314    Clinical Impression Statement Pt's BERG score indicates pt is at significant risk for falls, and PT educated pt on the importance of using AD during amb. at all times for safety. Pt experienced increased postural sway during balance activites with narrow BOS and eyes closed, indicating decr. input from vestibular system. Continue with POC.   Pt will benefit from skilled therapeutic intervention in order to improve on the following deficits Abnormal gait;Decreased activity tolerance;Decreased balance;Dizziness;Postural dysfunction;Decreased range of motion;Difficulty walking   Rehab Potential Good   PT Frequency 2x / week   PT Duration 8 weeks   PT Treatment/Interventions Therapeutic exercise;Therapeutic activities;Functional mobility training;Gait training;Stair training;Patient/family education;Vestibular;Canalith Repostioning;ADLs/Self Care Home Management;Balance training;Neuromuscular re-education   PT Next Visit Plan Review balance HEP prn, dynamic gait with SPC.   PT Home Exercise Plan Gaze and balance HEP.   Consulted and Agree with Plan of Care Patient        Problem List Patient Active Problem List   Diagnosis Date Noted  . Hyperlipidemia 01/02/2016  . Essential hypertension 01/02/2016  . Aortic insufficiency 01/02/2016  . Dizziness  09/10/2015  . Carotid artery stenosis 10/03/2012  . CAROTID ARTERY DISEASE 02/19/2011  . CVA 03/02/2010  . DEEP VENOUS THROMBOPHLEBITIS, LEG, LEFT 03/02/2010  . DEGENERATIVE DISC DISEASE, CERVICAL SPINE 03/02/2010  . DEGENERATIVE DISC DISEASE, LUMBAR SPINE 03/02/2010  . SHINGLES 02/08/2010  . DISCITIS 02/08/2010    Ansel Ferrall L 02/06/2016, 1:17 PM  [facility] [address] [place], [place], [address] Phone: [phone]   Fax:  [phone]  Name: SOMA GENTLE MRN: [id number] Date of Birth: 05-May-1936    Geoffry Paradise, PT,DPT 02/06/2016 1:17 PM Phone: [phone] Fax: [phone]

## 2016-02-09 ENCOUNTER — Telehealth: Payer: Self-pay | Admitting: Pharmacist Clinician (PhC)/ Clinical Pharmacy Specialist

## 2016-02-09 NOTE — Telephone Encounter (Signed)
New message      Calling to let the pharmacist know that the patient's statin history is included in the oct 2016 office note sent to Dr Stanford Breed.  They received a note asking Dr Blomgrem's office to call with statin history on pt.  It you need more info or do not have the office note, please call them back

## 2016-02-10 ENCOUNTER — Ambulatory Visit: Payer: Medicare Other | Admitting: Rehabilitative and Restorative Service Providers"

## 2016-02-10 DIAGNOSIS — R42 Dizziness and giddiness: Secondary | ICD-10-CM

## 2016-02-10 DIAGNOSIS — R269 Unspecified abnormalities of gait and mobility: Secondary | ICD-10-CM

## 2016-02-10 NOTE — Therapy (Signed)
[facility] [address] [place], [place], [address] Phone: [phone]   Fax:  [phone]  Physical Therapy Treatment  Patient Details  Name: [name] MRN: [id number] Date of Birth: 1936/08/06 Referring Provider: Heide Spark, MD  Encounter Date: 02/10/2016      PT End of Session - 02/10/16 2016    Visit Number 3   Number of Visits 16   Date for PT Re-Evaluation 03/27/16   Authorization Type G code every 10th visit   PT Start Time 1107   PT Stop Time 1148   PT Time Calculation (min) 41 min   Equipment Utilized During Treatment Gait belt   Activity Tolerance Patient tolerated treatment well   Behavior During Therapy [facility] for tasks assessed/performed      Past Medical History  Diagnosis Date  . Hyperlipidemia   . Osteoarthritis of cervical spine   . History of shingles 11-2008  . Stroke ([place]) 12-2009  . Carotid artery occlusion   . Kidney stones 2012  . Hypertension   . Allergy   . Diabetes mellitus without complication [facility])     Past Surgical History  Procedure Laterality Date  . Hernia repair  1963    left side  . Abdominal hysterectomy  1973  . Lumbar disc surgery  1975  . Gallbladder surgery  1988    large stone blockage  . Ankle reconstruction  1990    right - 5 screws & plate  . Rotator cuff repair  1994    right  . Spine surgery  2000    C4-C5 fusion  . Stomach surgery  2002    mass rt & left (not cancerous)  . Spine surgery  2003    C6-C7 fusion  . Foot fusion  2005    left - 4 screws  . Spine surgery  2010    replace broken plate (cervical region)  . Spinal fusion surgery  2000    C4 and C5  . Spinal fusion surgery  2003, 2010    C6 and C7  . Cholecystectomy  1988  . Lumbar epidural injection  May-Aug-Sept. 2015/ 08-29-15    There were no vitals filed for this visit.  Visit Diagnosis:  Abnormality of gait  Dizziness and giddiness      Subjective Assessment -  02/10/16 1110    Subjective The patient reports that she is doing HEP with husband's help.  She reports that she has had more of "these pains" in both sides of her head.     Patient Stated Goals Get my balance back and get rid of the RW.   Currently in Pain? No/denies      Gait: Ambulation with RW modified indep into/out of clinic x 75 feet x 2 Ambulation with RW with horizontal and vertical scanning with mild dizziness reported with CGA for safety Ambulation without a device x 230 feet x 3 reps with cues on longer stride length and arm swing with CGA to min A  NEUROMUSCULAR RE-EDUCATION: Wall bumps x 10 with eyes open and x 10 with eyes closed with SBA for safety Standing balance with head turns, 180 degree turns in standing  THERAPEUTIC EXERCISE: Seated hamstring stretching Sit<>stand x 10 reps decreasing use of UEs x 2 sets with RW in front for safety  SELF CARE/HOME MANAGEMENT: Discussed need for improved footwear for more stability during gait.  Patient verbalizes understanding.       PT Short Term Goals - 02/10/16  2017    PT SHORT TERM GOAL #1   Title The patient will be indep with HEP for habituation, gaze, balance and general mobility.   Baseline Target date: 02/26/2016   Time 4   Period Weeks   PT SHORT TERM GOAL #2   Title The patient will tolerate gaze x 1 viewing x 30 seconds without subjective reports of dizziness.   Baseline Target date: 02/26/2016   Time 4   Period Weeks   PT SHORT TERM GOAL #3   Title The patient will be further assessed on Berg balance scale and will improve by 4 points from baseline.  (scored 42/56 on 02/06/2016)   Baseline Target date: 02/26/2016   Time 4   Period Weeks   PT SHORT TERM GOAL #4   Title The patient will improve gait speed from 2.24 ft/sec to > or equal to 2.6 ft/sec to demo improved community mobility.   Baseline Target date: 02/26/2016   Time 4   Period Weeks           PT Long Term Goals - 01/27/16 1434    PT LONG TERM  GOAL #1   Title The patient will be indep with progression of HEP for post d/c.   Baseline Target date 03/27/2016   Time 8   Period Weeks   PT LONG TERM GOAL #2   Title The patient will reduce dizziness handicap index from 58% to < or equal to 42% to demo decreased self perception of dizziness.   Baseline Target date 03/27/2016   Time 8   Period Weeks   PT LONG TERM GOAL #3   Title The patient will ambulate for short distances (100 ft) on level, well lit surfaces without a device independently for household mobility.   Baseline Target date 03/27/2016   Time 8   Period Weeks   PT LONG TERM GOAL #4   Title The patient will improve Berg score 6 or greater points from baseline measure.   Baseline Target date 03/27/2016   Time 8   Period Weeks               Plan - 02/10/16 2026    Clinical Impression Statement The patient is progressing in therapy with gait decreasing dependence on assistive devices.  Current gait pattern has decreased heel strike and current shoewear may be contributing to current mechanics.  PT educated patient on improved shoewear.   PT Next Visit Plan Review balance HEP prn, dynamic gait with SPC.  Check gaze.  Gait with turns.   Consulted and Agree with Plan of Care Patient        Problem List Patient Active Problem List   Diagnosis Date Noted  . Hyperlipidemia 01/02/2016  . Essential hypertension 01/02/2016  . Aortic insufficiency 01/02/2016  . Dizziness 09/10/2015  . Carotid artery stenosis 10/03/2012  . CAROTID ARTERY DISEASE 02/19/2011  . CVA 03/02/2010  . DEEP VENOUS THROMBOPHLEBITIS, LEG, LEFT 03/02/2010  . DEGENERATIVE DISC DISEASE, CERVICAL SPINE 03/02/2010  . DEGENERATIVE DISC DISEASE, LUMBAR SPINE 03/02/2010  . SHINGLES 02/08/2010  . DISCITIS 02/08/2010    Rilee Knoll, PT 02/10/2016, 8:28 PM  [facility] [address] [place], [place], [address] Phone: [phone]   Fax:   [phone]  Name: [name] MRN: [id number] Date of Birth: July 03, 1936

## 2016-02-12 ENCOUNTER — Ambulatory Visit: Payer: Medicare Other | Admitting: Rehabilitative and Restorative Service Providers"

## 2016-02-12 DIAGNOSIS — R269 Unspecified abnormalities of gait and mobility: Secondary | ICD-10-CM

## 2016-02-12 DIAGNOSIS — R42 Dizziness and giddiness: Secondary | ICD-10-CM | POA: Diagnosis not present

## 2016-02-12 NOTE — Patient Instructions (Signed)
HIP: Flexion / KNEE: Extension, Straight Leg Raise    Raise leg and hold for 3 seconds, keeping knee straight. Slowly lower. Perform slowly. _10__ reps on each side x  _2__ sets per day, _5__ days per week   Copyright  VHI. All rights reserved.  AMBULATION: Side Step     Near a countertop for safety. Hold stomach in and hold posture upright. Step sideways along the length of your counter. Repeat in opposite direction. _10 steps x 4 reps up/down hall/counter. 2 sets per day, __5_ days per week.  Copyright  VHI. All rights reserved.

## 2016-02-12 NOTE — Therapy (Signed)
[place] [facility] [address] [place], [place], [address] Phone: [phone]   Fax:  [phone]  Physical Therapy Treatment  Patient Details  Name: [name] MRN: [id number] Date of Birth: [birth year] Referring Provider: Heide Spark, MD  Encounter Date: 02/12/2016      PT End of Session - 02/12/16 1108    Visit Number 4   Number of Visits 16   Date for PT Re-Evaluation 03/27/16   Authorization Type G code every 10th visit   PT Start Time 1105   PT Stop Time 1148   PT Time Calculation (min) 43 min   Equipment Utilized During Treatment Gait belt   Activity Tolerance Patient tolerated treatment well   Behavior During Therapy [facility] for tasks assessed/performed      Past Medical History  Diagnosis Date  . Hyperlipidemia   . Osteoarthritis of cervical spine   . History of shingles 11-2008  . Stroke ([place]) 12-2009  . Carotid artery occlusion   . Kidney stones 2012  . Hypertension   . Allergy   . Diabetes mellitus without complication [facility])     Past Surgical History  Procedure Laterality Date  . Hernia repair  1963    left side  . Abdominal hysterectomy  1973  . Lumbar disc surgery  1975  . Gallbladder surgery  1988    large stone blockage  . Ankle reconstruction  1990    right - 5 screws & plate  . Rotator cuff repair  1994    right  . Spine surgery  2000    C4-C5 fusion  . Stomach surgery  2002    mass rt & left (not cancerous)  . Spine surgery  2003    C6-C7 fusion  . Foot fusion  2005    left - 4 screws  . Spine surgery  2010    replace broken plate (cervical region)  . Spinal fusion surgery  2000    C4 and C5  . Spinal fusion surgery  2003, 2010    C6 and C7  . Cholecystectomy  1988  . Lumbar epidural injection  May-Aug-Sept. 2015/ 08-29-15    There were no vitals filed for this visit.  Visit Diagnosis:  Dizziness and giddiness  Abnormality of gait      Subjective Assessment -  02/12/16 1105    Subjective The patient reports that she has 3 pair of sneakers.  She reports that it is taking her awhile to adjust due to h/o screws in her ankles.   She reports dizziness is improving.  She reports 2 times of pain in the side of her head.   Pertinent History She notes a back injection with possible CSF leak.  She had a blood patch to treat CSF leaks, which helped the headaches but not the dizziness.  CVA in Dec 2010.   Patient Stated Goals Get my balance back and get rid of the RW.   Currently in Pain? No/denies      THERAPEUTIC EXERCISE: Supine: Straight leg raise x 10 reps R and L Bridges x 10 reps Supine marching with lumbar stabilization Sidelying (R) L IT band stretch with discomfort reported  Seated hamstring stretching  Standing side-stepping x 10 feet x 4 reps with cues for abdominal contraction/core stability  Gait: Ambulation without device x 230 feet x 3 reps with cues on heel strike and tactile cues for arm swing with CGA for safety.        PT  Education - 02/12/16 1143    Education provided Yes   Education Details HEP: side stepping, straight leg raises   Person(s) Educated Patient   Methods Explanation;Demonstration;Handout   Comprehension Verbalized understanding;Returned demonstration          PT Short Term Goals - 02/10/16 2017    PT SHORT TERM GOAL #1   Title The patient will be indep with HEP for habituation, gaze, balance and general mobility.   Baseline Target date: 02/26/2016   Time 4   Period Weeks   PT SHORT TERM GOAL #2   Title The patient will tolerate gaze x 1 viewing x 30 seconds without subjective reports of dizziness.   Baseline Target date: 02/26/2016   Time 4   Period Weeks   PT SHORT TERM GOAL #3   Title The patient will be further assessed on Berg balance scale and will improve by 4 points from baseline.  (scored 42/56 on 02/06/2016)   Baseline Target date: 02/26/2016   Time 4   Period Weeks   PT SHORT TERM GOAL #4    Title The patient will improve gait speed from 2.24 ft/sec to > or equal to 2.6 ft/sec to demo improved community mobility.   Baseline Target date: 02/26/2016   Time 4   Period Weeks           PT Long Term Goals - 01/27/16 1434    PT LONG TERM GOAL #1   Title The patient will be indep with progression of HEP for post d/c.   Baseline Target date 03/27/2016   Time 8   Period Weeks   PT LONG TERM GOAL #2   Title The patient will reduce dizziness handicap index from 58% to < or equal to 42% to demo decreased self perception of dizziness.   Baseline Target date 03/27/2016   Time 8   Period Weeks   PT LONG TERM GOAL #3   Title The patient will ambulate for short distances (100 ft) on level, well lit surfaces without a device independently for household mobility.   Baseline Target date 03/27/2016   Time 8   Period Weeks   PT LONG TERM GOAL #4   Title The patient will improve Berg score 6 or greater points from baseline measure.   Baseline Target date 03/27/2016   Time 8   Period Weeks               Plan - 02/12/16 1256    Clinical Impression Statement The patient is improving with dizziness and balance.  Her gait mechanics are improved with shoewear today of tennis shoes.  She c/o discomfort in low back and L hip into IT band that is hindering extended mobility.  PT to continue to progress to STGs/LTGs.   PT Next Visit Plan Review balance HEP prn, dynamic gait with SPC.  Check gaze.  Gait with turns.  IT band stretching, hip strengthening.   Consulted and Agree with Plan of Care Patient        Problem List Patient Active Problem List   Diagnosis Date Noted  . Hyperlipidemia 01/02/2016  . Essential hypertension 01/02/2016  . Aortic insufficiency 01/02/2016  . Dizziness 09/10/2015  . Carotid artery stenosis 10/03/2012  . CAROTID ARTERY DISEASE 02/19/2011  . CVA 03/02/2010  . DEEP VENOUS THROMBOPHLEBITIS, LEG, LEFT 03/02/2010  . DEGENERATIVE DISC DISEASE, CERVICAL SPINE  03/02/2010  . DEGENERATIVE DISC DISEASE, LUMBAR SPINE 03/02/2010  . SHINGLES 02/08/2010  . DISCITIS 02/08/2010  [place], PT 02/12/2016, 12:57 PM  [facility] [address] [place], [place], [address] Phone: [phone]   Fax:  [phone]  Name: [name] MRN: [id number] Date of Birth: 1936-03-22

## 2016-02-16 ENCOUNTER — Ambulatory Visit: Payer: Medicare Other | Admitting: Rehabilitative and Restorative Service Providers"

## 2016-02-16 DIAGNOSIS — R42 Dizziness and giddiness: Secondary | ICD-10-CM | POA: Diagnosis not present

## 2016-02-16 DIAGNOSIS — R269 Unspecified abnormalities of gait and mobility: Secondary | ICD-10-CM

## 2016-02-16 NOTE — Patient Instructions (Signed)
  Gaze Stabilization: Tip Card 1.Target must remain in focus, not blurry, and appear stationary while head is in motion. 2.Perform exercises with small head movements (45 to either side of midline). 3.Increase speed of head motion so long as target is in focus. 4.If you wear eyeglasses, be sure you can see target through lens (therapist will give specific instructions for bifocal / progressive lenses). 5.These exercises may provoke dizziness or nausea. Work through these symptoms. If too dizzy, slow head movement slightly. Rest between each exercise. 6.Exercises demand concentration; avoid distractions. 7.For safety, perform standing exercises close to a counter, wall, corner, or next to someone.  Copyright  VHI. All rights reserved.  Gaze Stabilization: Standing Feet Apart   Feet shoulder width apart, keeping eyes on target on wall 3 feet away, tilt head down slightly and move head side to side for 30 seconds.  Do 2 sessions per day.   Copyright  VHI. All rights reserved.

## 2016-02-16 NOTE — Therapy (Signed)
[facility] [address] [place], [place], [address] Phone: [phone]   Fax:  [phone]  Physical Therapy Treatment  Patient Details  Name: [name] MRN: [id number] Date of Birth: [birth year] Referring Provider: Heide Spark, MD  Encounter Date: 02/16/2016      PT End of Session - 02/16/16 1225    Visit Number 5   Number of Visits 16   Date for PT Re-Evaluation 03/27/16   Authorization Type G code every 10th visit   PT Start Time 1150   PT Stop Time 1230   PT Time Calculation (min) 40 min   Equipment Utilized During Treatment Gait belt   Activity Tolerance Patient tolerated treatment well   Behavior During Therapy [facility] for tasks assessed/performed      Past Medical History  Diagnosis Date  . Hyperlipidemia   . Osteoarthritis of cervical spine   . History of shingles 11-2008  . Stroke ([place]) 12-2009  . Carotid artery occlusion   . Kidney stones 2012  . Hypertension   . Allergy   . Diabetes mellitus without complication [facility])     Past Surgical History  Procedure Laterality Date  . Hernia repair  1963    left side  . Abdominal hysterectomy  1973  . Lumbar disc surgery  1975  . Gallbladder surgery  1988    large stone blockage  . Ankle reconstruction  1990    right - 5 screws & plate  . Rotator cuff repair  1994    right  . Spine surgery  2000    C4-C5 fusion  . Stomach surgery  2002    mass rt & left (not cancerous)  . Spine surgery  2003    C6-C7 fusion  . Foot fusion  2005    left - 4 screws  . Spine surgery  2010    replace broken plate (cervical region)  . Spinal fusion surgery  2000    C4 and C5  . Spinal fusion surgery  2003, 2010    C6 and C7  . Cholecystectomy  1988  . Lumbar epidural injection  May-Aug-Sept. 2015/ 08-29-15    There were no vitals filed for this visit.  Visit Diagnosis:  Abnormality of gait  Dizziness and giddiness      Subjective Assessment -  02/16/16 1158    Subjective The patient reports dizziness is improved and she still notes that if she moves quickly she can feel symptoms.  She has been working on stretching at home for the L IT band.  She is using RW intermittently in the home.  She feels that turns make her feel unsteady.   Patient Stated Goals Get my balance back and get rid of the RW.   Currently in Pain? Yes   Pain Score --  severe with palpation of L IT band   Pain Location Hip   Pain Orientation Left   Pain Descriptors / Indicators Aching;Sharp   Pain Type Chronic pain   Pain Onset More than a month ago   Pain Frequency Intermittent   Aggravating Factors  worse with palpation   Pain Relieving Factors unsure      THERAPEUTIC EXERCISE: Hip flexor stretch thomas test x 2 reps each side with contract/relax IT band stretch supine passively  Sidelying R with L IT band stretch Sidelying L with R IT band stretch Trunk rotation supine Gentle foam roll and soft tissue mobilization during stretch to relax muscle Seated long arc  quads  NEUROMUSCULAR RE-EDUCATION: Standing 180 degree turns spotting objects and resting in between reps due to dizziness Standing neck A/ROM with feet apart STanding gaze x 1 viewing near support surface for safety with cues on neck motion  Gait: Ambulation without a device with multi-directional changes, forwards/backwards ambulation with CGA, changes in speed with CGA       PT Education - 02/16/16 1347    Education provided Yes   Education Details HEP: progressed gaze to standing near support surface   Person(s) Educated Patient   Methods Explanation;Demonstration;Handout   Comprehension Verbalized understanding;Returned demonstration          PT Short Term Goals - 02/10/16 2017    PT SHORT TERM GOAL #1   Title The patient will be indep with HEP for habituation, gaze, balance and general mobility.   Baseline Target date: 02/26/2016   Time 4   Period Weeks   PT SHORT TERM  GOAL #2   Title The patient will tolerate gaze x 1 viewing x 30 seconds without subjective reports of dizziness.   Baseline Target date: 02/26/2016   Time 4   Period Weeks   PT SHORT TERM GOAL #3   Title The patient will be further assessed on Berg balance scale and will improve by 4 points from baseline.  (scored 42/56 on 02/06/2016)   Baseline Target date: 02/26/2016   Time 4   Period Weeks   PT SHORT TERM GOAL #4   Title The patient will improve gait speed from 2.24 ft/sec to > or equal to 2.6 ft/sec to demo improved community mobility.   Baseline Target date: 02/26/2016   Time 4   Period Weeks           PT Long Term Goals - 01/27/16 1434    PT LONG TERM GOAL #1   Title The patient will be indep with progression of HEP for post d/c.   Baseline Target date 03/27/2016   Time 8   Period Weeks   PT LONG TERM GOAL #2   Title The patient will reduce dizziness handicap index from 58% to < or equal to 42% to demo decreased self perception of dizziness.   Baseline Target date 03/27/2016   Time 8   Period Weeks   PT LONG TERM GOAL #3   Title The patient will ambulate for short distances (100 ft) on level, well lit surfaces without a device independently for household mobility.   Baseline Target date 03/27/2016   Time 8   Period Weeks   PT LONG TERM GOAL #4   Title The patient will improve Berg score 6 or greater points from baseline measure.   Baseline Target date 03/27/2016   Time 8   Period Weeks               Plan - 02/16/16 1347    Clinical Impression Statement PT emphasizing overall increase in mobility by addressing neck ROM, posture, hip strength, balance and dizziness.  Continue progressing towards STGs and LTGs.   PT Next Visit Plan Review balance HEP prn, dynamic gait with SPC.  Check gaze.  Gait with turns.  IT band stretching, hip strengthening.   Consulted and Agree with Plan of Care Patient        Problem List Patient Active Problem List   Diagnosis Date Noted   . Hyperlipidemia 01/02/2016  . Essential hypertension 01/02/2016  . Aortic insufficiency 01/02/2016  . Dizziness 09/10/2015  . Carotid artery stenosis 10/03/2012  .  CAROTID ARTERY DISEASE 02/19/2011  . CVA 03/02/2010  . DEEP VENOUS THROMBOPHLEBITIS, LEG, LEFT 03/02/2010  . DEGENERATIVE DISC DISEASE, CERVICAL SPINE 03/02/2010  . DEGENERATIVE DISC DISEASE, LUMBAR SPINE 03/02/2010  . SHINGLES 02/08/2010  . DISCITIS 02/08/2010    [name], PT 02/16/2016, 1:48 PM  [facility] [address] [place], [place], [address] Phone: [phone]   Fax:  [phone]  Name: [name] MRN: [id number] Date of Birth: 1936/08/07

## 2016-02-20 ENCOUNTER — Ambulatory Visit: Payer: Medicare Other | Admitting: Rehabilitative and Restorative Service Providers"

## 2016-02-20 DIAGNOSIS — R42 Dizziness and giddiness: Secondary | ICD-10-CM | POA: Diagnosis not present

## 2016-02-20 DIAGNOSIS — R269 Unspecified abnormalities of gait and mobility: Secondary | ICD-10-CM

## 2016-02-20 NOTE — Therapy (Signed)
[facility] [address] [place], [place], [address] Phone: [phone]   Fax:  [phone]  Physical Therapy Treatment  Patient Details  Name: [name] MRN: [id number] Date of Birth: 1936-10-19 Referring Provider: Heide Spark, MD  Encounter Date: 02/20/2016      PT End of Session - 02/20/16 1551    Visit Number 6   Number of Visits 16   Date for PT Re-Evaluation 03/27/16   Authorization Type G code every 10th visit   PT Start Time 1233   PT Stop Time 1315   PT Time Calculation (min) 42 min   Equipment Utilized During Treatment Gait belt   Activity Tolerance Patient tolerated treatment well   Behavior During Therapy [facility] for tasks assessed/performed      Past Medical History  Diagnosis Date  . Hyperlipidemia   . Osteoarthritis of cervical spine   . History of shingles 11-2008  . Stroke ([place]) 12-2009  . Carotid artery occlusion   . Kidney stones 2012  . Hypertension   . Allergy   . Diabetes mellitus without complication [facility])     Past Surgical History  Procedure Laterality Date  . Hernia repair  1963    left side  . Abdominal hysterectomy  1973  . Lumbar disc surgery  1975  . Gallbladder surgery  1988    large stone blockage  . Ankle reconstruction  1990    right - 5 screws & plate  . Rotator cuff repair  1994    right  . Spine surgery  2000    C4-C5 fusion  . Stomach surgery  2002    mass rt & left (not cancerous)  . Spine surgery  2003    C6-C7 fusion  . Foot fusion  2005    left - 4 screws  . Spine surgery  2010    replace broken plate (cervical region)  . Spinal fusion surgery  2000    C4 and C5  . Spinal fusion surgery  2003, 2010    C6 and C7  . Cholecystectomy  1988  . Lumbar epidural injection  May-Aug-Sept. 2015/ 08-29-15    There were no vitals filed for this visit.  Visit Diagnosis:  Abnormality of gait  Dizziness and giddiness      Subjective Assessment -  02/20/16 1238    Subjective The patient reports that symptoms are worse when she rushes and she gets a little bit of dizziness.     Patient Stated Goals Get my balance back and get rid of the RW.   Currently in Pain? Yes   Pain Score 2    Pain Location Hip   Pain Orientation Left   Pain Descriptors / Indicators Aching   Pain Type Chronic pain   Pain Onset More than a month ago   Pain Frequency Intermittent   Aggravating Factors  unsure   Pain Relieving Factors unsure      NEUROMUSCULAR RE-EDUCATION: Gaze x 1 adaptation in standing with cues on maintaining eyes on target Marching x 50 feet x 3 reps with cues on slower speed to emphasize single limb stance Quarter turns progressing to 1/2 turns with eyes/head motion  Gait: Gait speed=2.56 ft/sec without a device Ambulation with horizontal/ vertical head turns with CGA to min A x 100 feet Forwards/backwards walking with CGA for safety Increasing speed during gait emphasizing arm swing and more powerful stepping   THERAPEUTIC EXERCISE: Supine neck A/ROM and P/ROM with overpressure into  rotation, sidebending, and flexion Shoulder depression needed during ROM to improve mechanics and muscle length      PT Short Term Goals - 02/20/16 1240    PT SHORT TERM GOAL #1   Title The patient will be indep with HEP for habituation, gaze, balance and general mobility.   Baseline Met on 02/20/2016   Time 4   Period Weeks   Status Achieved   PT SHORT TERM GOAL #2   Title The patient will tolerate gaze x 1 viewing x 30 seconds without subjective reports of dizziness.   Baseline Met on 02/20/2016   Time 4   Period Weeks   Status Achieved   PT SHORT TERM GOAL #3   Title The patient will be further assessed on Berg balance scale and will improve by 4 points from baseline.  (scored 42/56 on 02/06/2016)   Baseline Target date: 02/26/2016   Time 4   Period Weeks   PT SHORT TERM GOAL #4   Title The patient will improve gait speed from 2.24 ft/sec  to > or equal to 2.6 ft/sec to demo improved community mobility.   Baseline Target date: 02/26/2016 (2.56 on 02/20/2016)   Time 4   Period Weeks   Status On-going           PT Long Term Goals - 01/27/16 1434    PT LONG TERM GOAL #1   Title The patient will be indep with progression of HEP for post d/c.   Baseline Target date 03/27/2016   Time 8   Period Weeks   PT LONG TERM GOAL #2   Title The patient will reduce dizziness handicap index from 58% to < or equal to 42% to demo decreased self perception of dizziness.   Baseline Target date 03/27/2016   Time 8   Period Weeks   PT LONG TERM GOAL #3   Title The patient will ambulate for short distances (100 ft) on level, well lit surfaces without a device independently for household mobility.   Baseline Target date 03/27/2016   Time 8   Period Weeks   PT LONG TERM GOAL #4   Title The patient will improve Berg score 6 or greater points from baseline measure.   Baseline Target date 03/27/2016   Time 8   Period Weeks               Plan - 02/20/16 1552    Clinical Impression Statement The patient met 2 STGs with improved tolerance to VOR gaze activities and ability to carryout HEP.  PT to continue to progress turns, dynamic balance, neck ROM, posture for improved mobility.  Continue towards further STGs/LTGs.   PT Next Visit Plan Review balance HEP prn, dynamic gait without a device.  Check gaze.  Gait with turns.  IT band stretching, hip strengthening.   Consulted and Agree with Plan of Care Patient        Problem List Patient Active Problem List   Diagnosis Date Noted  . Hyperlipidemia 01/02/2016  . Essential hypertension 01/02/2016  . Aortic insufficiency 01/02/2016  . Dizziness 09/10/2015  . Carotid artery stenosis 10/03/2012  . CAROTID ARTERY DISEASE 02/19/2011  . CVA 03/02/2010  . DEEP VENOUS THROMBOPHLEBITIS, LEG, LEFT 03/02/2010  . DEGENERATIVE DISC DISEASE, CERVICAL SPINE 03/02/2010  . DEGENERATIVE DISC DISEASE,  LUMBAR SPINE 03/02/2010  . SHINGLES 02/08/2010  . DISCITIS 02/08/2010    Marlos Carmen, PT 02/20/2016, 3:53 PM  [facility] [address]  [place], [place], [address] Phone: [phone]   Fax:  [phone]  Name: [name] MRN: [id number] Date of Birth: [birth year]

## 2016-02-24 ENCOUNTER — Ambulatory Visit: Payer: Medicare Other | Admitting: Rehabilitative and Restorative Service Providers"

## 2016-02-24 DIAGNOSIS — R269 Unspecified abnormalities of gait and mobility: Secondary | ICD-10-CM

## 2016-02-24 DIAGNOSIS — R42 Dizziness and giddiness: Secondary | ICD-10-CM

## 2016-02-25 NOTE — Therapy (Signed)
[facility] [address] [place], [place], [address] Phone: [phone]   Fax:  [phone]  Physical Therapy Treatment  Patient Details  Name: [name] MRN: [id number] Date of Birth: 03-02-1936 Referring Provider: Heide Spark, MD  Encounter Date: 02/24/2016      PT End of Session - 02/24/16 1107    Visit Number 7   Number of Visits 16   Date for PT Re-Evaluation 03/27/16   Authorization Type G code every 10th visit   PT Start Time 1107   PT Stop Time 1145   PT Time Calculation (min) 38 min   Equipment Utilized During Treatment Gait belt   Activity Tolerance Patient tolerated treatment well   Behavior During Therapy [facility] for tasks assessed/performed      Past Medical History  Diagnosis Date  . Hyperlipidemia   . Osteoarthritis of cervical spine   . History of shingles 11-2008  . Stroke ([place]) 12-2009  . Carotid artery occlusion   . Kidney stones 2012  . Hypertension   . Allergy   . Diabetes mellitus without complication [facility])     Past Surgical History  Procedure Laterality Date  . Hernia repair  1963    left side  . Abdominal hysterectomy  1973  . Lumbar disc surgery  1975  . Gallbladder surgery  1988    large stone blockage  . Ankle reconstruction  1990    right - 5 screws & plate  . Rotator cuff repair  1994    right  . Spine surgery  2000    C4-C5 fusion  . Stomach surgery  2002    mass rt & left (not cancerous)  . Spine surgery  2003    C6-C7 fusion  . Foot fusion  2005    left - 4 screws  . Spine surgery  2010    replace broken plate (cervical region)  . Spinal fusion surgery  2000    C4 and C5  . Spinal fusion surgery  2003, 2010    C6 and C7  . Cholecystectomy  1988  . Lumbar epidural injection  May-Aug-Sept. 2015/ 08-29-15    There were no vitals filed for this visit.  Visit Diagnosis:  Abnormality of gait  Dizziness and giddiness      Subjective Assessment -  02/24/16 1107    Subjective The patient reports she is walking more.  She notes that the letter exercise provokes dizziness--the letter is no longer moving during head motion.  She uses RW at night. Dizziness is intermittent in nature.  She reports that dizziness when getting up has reduced significantly.  She has "slight" headaches, but not as much as usual.   Pertinent History She notes a back injection with possible CSF leak.  She had a blood patch to treat CSF leaks, which helped the headaches but not the dizziness.  CVA in Dec 2010.   Patient Stated Goals Get my balance back and get rid of the RW.   Currently in Pain? Yes  reports "it's getting a lot better"   Pain Score 5    Pain Location Hip   Pain Orientation Left   Pain Descriptors / Indicators Aching   Pain Type Chronic pain   Pain Onset More than a month ago   Pain Frequency Intermittent   Aggravating Factors  worse with palpation and manual pressure.   Pain Relieving Factors unsure.            [facility]  PT Assessment - 02/24/16 1113    Standardized Balance Assessment   Standardized Balance Assessment Berg Balance Test   Berg Balance Test   Sit to Stand Able to stand without using hands and stabilize independently   Standing Unsupported Able to stand safely 2 minutes   Sitting with Back Unsupported but Feet Supported on Floor or Stool Able to sit safely and securely 2 minutes   Stand to Sit Sits safely with minimal use of hands   Transfers Able to transfer safely, minor use of hands   Standing Unsupported with Eyes Closed Able to stand 10 seconds safely   Standing Ubsupported with Feet Together Able to place feet together independently and stand 1 minute safely   From Standing, Reach Forward with Outstretched Arm Can reach forward >12 cm safely (5")   From Standing Position, Pick up Object from Floor Able to pick up shoe safely and easily   From Standing Position, Turn to Look Behind Over each Shoulder Looks behind from both  sides and weight shifts well   Turn 360 Degrees Able to turn 360 degrees safely but slowly   Standing Unsupported, Alternately Place Feet on Step/Stool Able to stand independently and safely and complete 8 steps in 20 seconds   Standing Unsupported, One Foot in Front Able to plae foot ahead of the other independently and hold 30 seconds   Standing on One Leg Tries to lift leg/unable to hold 3 seconds but remains standing independently   Total Score 49   Berg comment: 49/56 improved from 42/56.      NEUROMUSCULAR RE-EDUCATION: Berg=49/56  Gait: Community ambulation without a device with SBA to CGA for support negotiating level sidewalk surfaces, curbs, pinestraw surfaces with cues on step length and safety x 500+ feet. Dynamic gait with direction changes, head turns and quick starts/stops.   Backwards walking with CGA for safety x 10 feet x 6 repetitions. Level surface ambulation without a device independently x 345 feet Gait emphasizing arm swing with tactile cues and longer stride length with SBA   THERAPEUTIC EXERCISE: Supine P/ROM neck rotation, sidebending and facilitated chin tucks Seated neck A/ROM for rotation, sidebending, and flexion/extension. Sit<>stand for LE strengthening x 5 reps.       PT Short Term Goals - 02/24/16 1121    PT SHORT TERM GOAL #1   Title The patient will be indep with HEP for habituation, gaze, balance and general mobility.   Baseline Met on 02/20/2016   Time 4   Period Weeks   Status Achieved   PT SHORT TERM GOAL #2   Title The patient will tolerate gaze x 1 viewing x 30 seconds without subjective reports of dizziness.   Baseline Met on 02/20/2016   Time 4   Period Weeks   Status Achieved   PT SHORT TERM GOAL #3   Title The patient will be further assessed on Berg balance scale and will improve by 4 points from baseline.  (scored 42/56 on 02/06/2016)   Baseline Improved from 42/56 up to 49/56   Time 4   Period Weeks   Status Achieved   PT  SHORT TERM GOAL #4   Title The patient will improve gait speed from 2.24 ft/sec to > or equal to 2.6 ft/sec to demo improved community mobility.   Baseline Improved, but not met. (2.56 on 02/20/2016)   Time 4   Period Weeks   Status Partially Met           PT Long  Term Goals - 02/24/16 1129    PT LONG TERM GOAL #1   Title The patient will be indep with progression of HEP for post d/c.   Baseline Target date 03/27/2016   Time 8   Period Weeks   Status On-going   PT LONG TERM GOAL #2   Title The patient will reduce dizziness handicap index from 58% to < or equal to 42% to demo decreased self perception of dizziness.   Baseline Target date 03/27/2016   Time 8   Period Weeks   Status On-going   PT LONG TERM GOAL #3   Title The patient will ambulate for short distances (100 ft) on level, well lit surfaces without a device independently for household mobility.   Baseline Target date 03/27/2016   Time 8   Period Weeks   Status Achieved   PT LONG TERM GOAL #4   Title The patient will improve Berg score 6 or greater points from baseline measure.   Baseline IMproved from 42/56 up to 49/56.    Time 8   Period Weeks   Status Achieved               Plan - 02/25/16 6381    Clinical Impression Statement The patient has met 3/4 STGs and 2/4 LTGs.  She is demonstrating improved confidence with ambulation and improved mobility and balance.   PT Next Visit Plan Review balance HEP prn, dynamic gait without a device.  Check gaze.  Gait with turns.  IT band stretching, hip strengthening.   Consulted and Agree with Plan of Care Patient        Problem List Patient Active Problem List   Diagnosis Date Noted  . Hyperlipidemia 01/02/2016  . Essential hypertension 01/02/2016  . Aortic insufficiency 01/02/2016  . Dizziness 09/10/2015  . Carotid artery stenosis 10/03/2012  . CAROTID ARTERY DISEASE 02/19/2011  . CVA 03/02/2010  . DEEP VENOUS THROMBOPHLEBITIS, LEG, LEFT 03/02/2010  .  DEGENERATIVE DISC DISEASE, CERVICAL SPINE 03/02/2010  . DEGENERATIVE DISC DISEASE, LUMBAR SPINE 03/02/2010  . SHINGLES 02/08/2010  . DISCITIS 02/08/2010    Camela Wich, PT 02/25/2016, 8:24 AM  [facility] [address] [place], [place], [address] Phone: [phone]   Fax:  [phone]  Name: FARHANA FELLOWS MRN: [id number] Date of Birth: 05-19-1936

## 2016-02-27 ENCOUNTER — Ambulatory Visit: Payer: Medicare Other | Attending: Neurology | Admitting: Rehabilitative and Restorative Service Providers"

## 2016-02-27 DIAGNOSIS — R2689 Other abnormalities of gait and mobility: Secondary | ICD-10-CM | POA: Diagnosis present

## 2016-02-27 DIAGNOSIS — R269 Unspecified abnormalities of gait and mobility: Secondary | ICD-10-CM | POA: Insufficient documentation

## 2016-02-27 DIAGNOSIS — R42 Dizziness and giddiness: Secondary | ICD-10-CM | POA: Diagnosis present

## 2016-02-27 NOTE — Therapy (Signed)
[facility] [address] [place], [place], [address] Phone: [phone]   Fax:  [phone]  Physical Therapy Treatment  Patient Details  Name: [name] MRN: [id number] Date of Birth: Apr 25, 1936 Referring Provider: Heide Spark, MD  Encounter Date: 02/27/2016      PT End of Session - 02/27/16 1334    Visit Number 8   Number of Visits 16   Date for PT Re-Evaluation 03/27/16   Authorization Type G code every 10th visit   PT Start Time 1235   PT Stop Time 1315   PT Time Calculation (min) 40 min   Equipment Utilized During Treatment Gait belt   Activity Tolerance Patient tolerated treatment well   Behavior During Therapy [facility] for tasks assessed/performed      Past Medical History  Diagnosis Date  . Hyperlipidemia   . Osteoarthritis of cervical spine   . History of shingles 11-2008  . Stroke ([place]) 12-2009  . Carotid artery occlusion   . Kidney stones 2012  . Hypertension   . Allergy   . Diabetes mellitus without complication [facility])     Past Surgical History  Procedure Laterality Date  . Hernia repair  1963    left side  . Abdominal hysterectomy  1973  . Lumbar disc surgery  1975  . Gallbladder surgery  1988    large stone blockage  . Ankle reconstruction  1990    right - 5 screws & plate  . Rotator cuff repair  1994    right  . Spine surgery  2000    C4-C5 fusion  . Stomach surgery  2002    mass rt & left (not cancerous)  . Spine surgery  2003    C6-C7 fusion  . Foot fusion  2005    left - 4 screws  . Spine surgery  2010    replace broken plate (cervical region)  . Spinal fusion surgery  2000    C4 and C5  . Spinal fusion surgery  2003, 2010    C6 and C7  . Cholecystectomy  1988  . Lumbar epidural injection  May-Aug-Sept. 2015/ 08-29-15    There were no vitals filed for this visit.  Visit Diagnosis:  Abnormality of gait  Dizziness and giddiness      Subjective Assessment -  02/27/16 1234    Subjective The patient reports that she is able to bend down and tie her own shoes.  She reports she is not using RW in her home.  She carries her cane to walk with the dogs. The patient feels stronger in her legs.  She is still sore in her left hip.    Patient Stated Goals Get my balance back and get rid of the RW.   Currently in Pain? Yes  sore only with pressing on IT band L side   Pain Score 0-No pain     Gait: Dynamic gait activities walking on level surfaces with quick direction changes, starts/stops, forward/backwards walking, sidestepping  x 500 performing ball toss, head turns as able. Figure 8 walking x 10 reps increasing speed of movement.  MANUAL: Supine gentle manual distraction to patient tolerance Cervical contract/relax with overpressure into flexion for elongation Passive overpressure into rotation, sidebending and cervical flexion Upglides R and L side with passive overpressure into sidebending  NEUROMUSCULAR RE-EDUCATION: Standing single limb stance activities working up to 5 seconds on each leg Standing tandem stance progressing to tandem walking near support surface Sidestepping x  30 feet each direction emphasizing upright posture, verbal cues to keep toes facing forward Cone tapping R and L alternating tapping Single leg stance activities near cones kicking over/picking up with LEs 180 degree turns increasing speed of motion      PT Short Term Goals - 02/24/16 1121    PT SHORT TERM GOAL #1   Title The patient will be indep with HEP for habituation, gaze, balance and general mobility.   Baseline Met on 02/20/2016   Time 4   Period Weeks   Status Achieved   PT SHORT TERM GOAL #2   Title The patient will tolerate gaze x 1 viewing x 30 seconds without subjective reports of dizziness.   Baseline Met on 02/20/2016   Time 4   Period Weeks   Status Achieved   PT SHORT TERM GOAL #3   Title The patient will be further assessed on Berg balance scale  and will improve by 4 points from baseline.  (scored 42/56 on 02/06/2016)   Baseline Improved from 42/56 up to 49/56   Time 4   Period Weeks   Status Achieved   PT SHORT TERM GOAL #4   Title The patient will improve gait speed from 2.24 ft/sec to > or equal to 2.6 ft/sec to demo improved community mobility.   Baseline Improved, but not met. (2.56 on 02/20/2016)   Time 4   Period Weeks   Status Partially Met           PT Long Term Goals - 02/24/16 1129    PT LONG TERM GOAL #1   Title The patient will be indep with progression of HEP for post d/c.   Baseline Target date 03/27/2016   Time 8   Period Weeks   Status On-going   PT LONG TERM GOAL #2   Title The patient will reduce dizziness handicap index from 58% to < or equal to 42% to demo decreased self perception of dizziness.   Baseline Target date 03/27/2016   Time 8   Period Weeks   Status On-going   PT LONG TERM GOAL #3   Title The patient will ambulate for short distances (100 ft) on level, well lit surfaces without a device independently for household mobility.   Baseline Target date 03/27/2016   Time 8   Period Weeks   Status Achieved   PT LONG TERM GOAL #4   Title The patient will improve Berg score 6 or greater points from baseline measure.   Baseline IMproved from 42/56 up to 49/56.    Time 8   Period Weeks   Status Achieved               Plan - 02/27/16 1334    Clinical Impression Statement The patient is improving with turns and speed of movement during functional tasks.  PT encouraging patient to begin ambulating without RW on outdoor, level surfaces when family present for close supervision to begin to improve her confidence to return to community mobility without a device.   PT Next Visit Plan Review balance HEP prn, dynamic gait without a device.  Check gaze.  Gait with turns.  IT band stretching, hip strengthening.   Consulted and Agree with Plan of Care Patient        Problem List Patient Active  Problem List   Diagnosis Date Noted  . Hyperlipidemia 01/02/2016  . Essential hypertension 01/02/2016  . Aortic insufficiency 01/02/2016  . Dizziness 09/10/2015  . Carotid artery stenosis 10/03/2012  .  CAROTID ARTERY DISEASE 02/19/2011  . CVA 03/02/2010  . DEEP VENOUS THROMBOPHLEBITIS, LEG, LEFT 03/02/2010  . DEGENERATIVE DISC DISEASE, CERVICAL SPINE 03/02/2010  . DEGENERATIVE DISC DISEASE, LUMBAR SPINE 03/02/2010  . SHINGLES 02/08/2010  . DISCITIS 02/08/2010    Finnley Lewis, PT 02/27/2016, 1:36 PM  [facility] [address] [place], [place], [address] Phone: [phone]   Fax:  [phone]  Name: [name] MRN: [id number] Date of Birth: 06/28/1936

## 2016-03-03 ENCOUNTER — Ambulatory Visit: Payer: Medicare Other | Admitting: Rehabilitative and Restorative Service Providers"

## 2016-03-03 DIAGNOSIS — R269 Unspecified abnormalities of gait and mobility: Secondary | ICD-10-CM | POA: Diagnosis not present

## 2016-03-03 DIAGNOSIS — R42 Dizziness and giddiness: Secondary | ICD-10-CM

## 2016-03-03 NOTE — Therapy (Signed)
[facility] [address] [place], [place], [address] Phone: [phone]   Fax:  [phone]  Physical Therapy Treatment  Patient Details  Name: [name] MRN: [id number] Date of Birth: 1936/08/05 Referring Provider: Heide Spark, MD  Encounter Date: 03/03/2016      PT End of Session - 03/03/16 2151    Visit Number 9   Number of Visits 16   Date for PT Re-Evaluation 03/27/16   Authorization Type G code every 10th visit   PT Start Time 1108   PT Stop Time 1147   PT Time Calculation (min) 39 min   Equipment Utilized During Treatment Gait belt   Activity Tolerance Patient tolerated treatment well   Behavior During Therapy [facility] for tasks assessed/performed      Past Medical History  Diagnosis Date  . Hyperlipidemia   . Osteoarthritis of cervical spine   . History of shingles 11-2008  . Stroke ([place]) 12-2009  . Carotid artery occlusion   . Kidney stones 2012  . Hypertension   . Allergy   . Diabetes mellitus without complication [facility])     Past Surgical History  Procedure Laterality Date  . Hernia repair  1963    left side  . Abdominal hysterectomy  1973  . Lumbar disc surgery  1975  . Gallbladder surgery  1988    large stone blockage  . Ankle reconstruction  1990    right - 5 screws & plate  . Rotator cuff repair  1994    right  . Spine surgery  2000    C4-C5 fusion  . Stomach surgery  2002    mass rt & left (not cancerous)  . Spine surgery  2003    C6-C7 fusion  . Foot fusion  2005    left - 4 screws  . Spine surgery  2010    replace broken plate (cervical region)  . Spinal fusion surgery  2000    C4 and C5  . Spinal fusion surgery  2003, 2010    C6 and C7  . Cholecystectomy  1988  . Lumbar epidural injection  May-Aug-Sept. 2015/ 08-29-15    There were no vitals filed for this visit.  Visit Diagnosis:  Abnormality of gait  Dizziness and giddiness      Subjective Assessment -  03/03/16 1112    Subjective The patient arrives to therapy today without RW for the first day.   The patient reports that her eyes are twitching underneath her eyes.  She has eye doctor appointment this week.    Patient Stated Goals Get my balance back and get rid of the RW.      NEUROMUSCULAR RE-EDUCATION: Standing turns working on 180 degree turns reaching for objects.   Marching with CGA adding head turns for greater challenge  SELF CARE/HOME MANAGEMENT: Discussed dizziness in home environment, which can occur when bending forward.  PT not able to habituate that motion due to h/o back discomfort/pain.  PT and patient discussed continuing to progress speed of movement as long as patient feels safe.  Gait: Slow/fast ambulation and direction changing gait activities emphasizing longer stride length during backwards walking.   Gait with horizontal head turns with scanning horizontally, vertically and diagonally within patient's tolerable/available ROM.   Gait reading cards for visual targets during ambulation to emphasize scanning environments and improved turns with dynamic gait. Gait with direction changes turning quarter to 1/2 turns without stopping working on faster pace of movement with  CGA for safety.      PT Short Term Goals - 02/24/16 1121    PT SHORT TERM GOAL #1   Title The patient will be indep with HEP for habituation, gaze, balance and general mobility.   Baseline Met on 02/20/2016   Time 4   Period Weeks   Status Achieved   PT SHORT TERM GOAL #2   Title The patient will tolerate gaze x 1 viewing x 30 seconds without subjective reports of dizziness.   Baseline Met on 02/20/2016   Time 4   Period Weeks   Status Achieved   PT SHORT TERM GOAL #3   Title The patient will be further assessed on Berg balance scale and will improve by 4 points from baseline.  (scored 42/56 on 02/06/2016)   Baseline Improved from 42/56 up to 49/56   Time 4   Period Weeks   Status Achieved    PT SHORT TERM GOAL #4   Title The patient will improve gait speed from 2.24 ft/sec to > or equal to 2.6 ft/sec to demo improved community mobility.   Baseline Improved, but not met. (2.56 on 02/20/2016)   Time 4   Period Weeks   Status Partially Met           PT Long Term Goals - 02/24/16 1129    PT LONG TERM GOAL #1   Title The patient will be indep with progression of HEP for post d/c.   Baseline Target date 03/27/2016   Time 8   Period Weeks   Status On-going   PT LONG TERM GOAL #2   Title The patient will reduce dizziness handicap index from 58% to < or equal to 42% to demo decreased self perception of dizziness.   Baseline Target date 03/27/2016   Time 8   Period Weeks   Status On-going   PT LONG TERM GOAL #3   Title The patient will ambulate for short distances (100 ft) on level, well lit surfaces without a device independently for household mobility.   Baseline Target date 03/27/2016   Time 8   Period Weeks   Status Achieved   PT LONG TERM GOAL #4   Title The patient will improve Berg score 6 or greater points from baseline measure.   Baseline IMproved from 42/56 up to 49/56.    Time 8   Period Weeks   Status Achieved               Plan - 03/03/16 2152    Clinical Impression Statement The patient is improving mobility in home and limited community without the RW.  She continues with intermittent dizziness worse with turns and when bending forward.   PT to address while progressing mobility for safe community ambulation.    PT Next Visit Plan Work on dizziness/habituation of provoking positions.  Gait without device, check gaze, turns.   Consulted and Agree with Plan of Care Patient        Problem List Patient Active Problem List   Diagnosis Date Noted  . Hyperlipidemia 01/02/2016  . Essential hypertension 01/02/2016  . Aortic insufficiency 01/02/2016  . Dizziness 09/10/2015  . Carotid artery stenosis 10/03/2012  . CAROTID ARTERY DISEASE 02/19/2011  .  CVA 03/02/2010  . DEEP VENOUS THROMBOPHLEBITIS, LEG, LEFT 03/02/2010  . DEGENERATIVE DISC DISEASE, CERVICAL SPINE 03/02/2010  . DEGENERATIVE DISC DISEASE, LUMBAR SPINE 03/02/2010  . SHINGLES 02/08/2010  . DISCITIS 02/08/2010    Sharilyn Geisinger , PT  03/03/2016, 9:53 PM  [facility] [address] [place], [place], [address] Phone: [phone]   Fax:  [phone]  Name: [name] MRN: [id number] Date of Birth: 10-10-1936

## 2016-03-05 LAB — HEPATIC FUNCTION PANEL
ALT: 18 U/L (ref 6–29)
AST: 21 U/L (ref 10–35)
Albumin: 3.9 g/dL (ref 3.6–5.1)
Alkaline Phosphatase: 73 U/L (ref 33–130)
BILIRUBIN DIRECT: 0.1 mg/dL (ref <=0.2)
BILIRUBIN INDIRECT: 0.3 mg/dL (ref 0.2–1.2)
TOTAL PROTEIN: 6.3 g/dL (ref 6.1–8.1)
Total Bilirubin: 0.4 mg/dL (ref 0.2–1.2)

## 2016-03-05 LAB — LIPID PANEL
CHOL/HDL RATIO: 3.8 ratio (ref <=5.0)
CHOLESTEROL: 216 mg/dL — AB (ref 125–200)
HDL: 57 mg/dL (ref >=46)
LDL CALC: 109 mg/dL (ref <130)
Triglycerides: 252 mg/dL — ABNORMAL HIGH (ref <150)
VLDL: 50 mg/dL — AB (ref <30)

## 2016-03-10 ENCOUNTER — Ambulatory Visit: Payer: Medicare Other | Admitting: Rehabilitative and Restorative Service Providers"

## 2016-03-10 VITALS — BP 175/82

## 2016-03-10 DIAGNOSIS — R42 Dizziness and giddiness: Secondary | ICD-10-CM

## 2016-03-10 DIAGNOSIS — R269 Unspecified abnormalities of gait and mobility: Secondary | ICD-10-CM | POA: Diagnosis not present

## 2016-03-10 NOTE — Therapy (Signed)
[facility] [address] [place], [place], [address] Phone: [phone]   Fax:  [phone]  Physical Therapy Treatment  Patient Details  Name: [name] MRN: [id number] Date of Birth: Jul 26, 1936 Referring Provider: Heide Spark, MD  Encounter Date: 03/10/2016      PT End of Session - 03/10/16 1451    Visit Number 10   Number of Visits 16   Date for PT Re-Evaluation 03/27/16   Authorization Type G code every 10th visit   PT Start Time 1110   PT Stop Time 1140   PT Time Calculation (min) 30 min   Equipment Utilized During Treatment Gait belt   Activity Tolerance Patient tolerated treatment well   Behavior During Therapy [facility] for tasks assessed/performed      Past Medical History  Diagnosis Date  . Hyperlipidemia   . Osteoarthritis of cervical spine   . History of shingles 11-2008  . Stroke ([place]) 12-2009  . Carotid artery occlusion   . Kidney stones 2012  . Hypertension   . Allergy   . Diabetes mellitus without complication [facility])     Past Surgical History  Procedure Laterality Date  . Hernia repair  1963    left side  . Abdominal hysterectomy  1973  . Lumbar disc surgery  1975  . Gallbladder surgery  1988    large stone blockage  . Ankle reconstruction  1990    right - 5 screws & plate  . Rotator cuff repair  1994    right  . Spine surgery  2000    C4-C5 fusion  . Stomach surgery  2002    mass rt & left (not cancerous)  . Spine surgery  2003    C6-C7 fusion  . Foot fusion  2005    left - 4 screws  . Spine surgery  2010    replace broken plate (cervical region)  . Spinal fusion surgery  2000    C4 and C5  . Spinal fusion surgery  2003, 2010    C6 and C7  . Cholecystectomy  1988  . Lumbar epidural injection  May-Aug-Sept. 2015/ 08-29-15    Filed Vitals:   03/10/16 1454 03/10/16 1455  BP: 207/115 175/82    Visit Diagnosis:  Abnormality of gait  Dizziness and giddiness       Subjective Assessment - 03/10/16 1112    Subjective The patient had back pain last week after PT.   She is noting bending over provokes dizziness.  "I've had it right much this week."  It is bothering her daily.  The patient used her RW today to come into therapy.    Patient Stated Goals Get my balance back and get rid of the RW.   Currently in Pain? Yes      NEUROMUSCULAR RE-EDUCATION: Patient c/o worsening dizziness this week.  PT initially had patient perform R sidelying test, which was negative for dizziness and nystagmus.  She did L sidelying test with some dizziness, no nystagmus viewed in room light.  Performed brandt daroff habituation x 3 reps with less intensity of symptoms to the left with repetition.  Talked to patient further about dizziness and she began reporting headache today, which has not been a recent subjective complaint. PT taken (see vitals).  SELF CARE/HOME MANAGEMENT: Educated patient on stroke risk factors, warning signs.  Patient is able to verbalize understanding and remembered from prior CVA information/educations.       PT Education - 03/10/16  36    Education provided Yes   Education Details CVA risk factors, warning signs   Person(s) Educated Patient   Methods Explanation   Comprehension Verbalized understanding          PT Short Term Goals - 02/24/16 1121    PT SHORT TERM GOAL #1   Title The patient will be indep with HEP for habituation, gaze, balance and general mobility.   Baseline Met on 02/20/2016   Time 4   Period Weeks   Status Achieved   PT SHORT TERM GOAL #2   Title The patient will tolerate gaze x 1 viewing x 30 seconds without subjective reports of dizziness.   Baseline Met on 02/20/2016   Time 4   Period Weeks   Status Achieved   PT SHORT TERM GOAL #3   Title The patient will be further assessed on Berg balance scale and will improve by 4 points from baseline.  (scored 42/56 on 02/06/2016)   Baseline Improved from 42/56 up to  49/56   Time 4   Period Weeks   Status Achieved   PT SHORT TERM GOAL #4   Title The patient will improve gait speed from 2.24 ft/sec to > or equal to 2.6 ft/sec to demo improved community mobility.   Baseline Improved, but not met. (2.56 on 02/20/2016)   Time 4   Period Weeks   Status Partially Met           PT Long Term Goals - 02/24/16 1129    PT LONG TERM GOAL #1   Title The patient will be indep with progression of HEP for post d/c.   Baseline Target date 03/27/2016   Time 8   Period Weeks   Status On-going   PT LONG TERM GOAL #2   Title The patient will reduce dizziness handicap index from 58% to < or equal to 42% to demo decreased self perception of dizziness.   Baseline Target date 03/27/2016   Time 8   Period Weeks   Status On-going   PT LONG TERM GOAL #3   Title The patient will ambulate for short distances (100 ft) on level, well lit surfaces without a device independently for household mobility.   Baseline Target date 03/27/2016   Time 8   Period Weeks   Status Achieved   PT LONG TERM GOAL #4   Title The patient will improve Berg score 6 or greater points from baseline measure.   Baseline IMproved from 42/56 up to 49/56.    Time 8   Period Weeks   Status Achieved               Plan - 03/10/16 1455    Clinical Impression Statement The patient c/o headache today with worsening dizziness over the past week.  She is walking with RW today and was able to walk into clinic without device last week.  Patient's BP elevated.  Due to h/o CVA and HA today, called patient's MD and left message.  Patient educated to go to ED if symptoms worsened.   Pt will benefit from skilled therapeutic intervention in order to improve on the following deficits Abnormal gait;Decreased activity tolerance;Decreased balance;Dizziness;Postural dysfunction;Decreased range of motion;Difficulty walking   Rehab Potential Good   PT Frequency 2x / week   PT Duration 8 weeks   PT  Treatment/Interventions Therapeutic exercise;Therapeutic activities;Functional mobility training;Gait training;Stair training;Patient/family education;Vestibular;Canalith Repostioning;ADLs/Self Care Home Management;Balance training;Neuromuscular re-education   PT Next Visit Plan Work on dizziness/habituation of  provoking positions.  Gait without device, check gaze, turns.   PT Home Exercise Plan Gaze and balance HEP.   Consulted and Agree with Plan of Care Patient        Problem List Patient Active Problem List   Diagnosis Date Noted  . Hyperlipidemia 01/02/2016  . Essential hypertension 01/02/2016  . Aortic insufficiency 01/02/2016  . Dizziness 09/10/2015  . Carotid artery stenosis 10/03/2012  . CAROTID ARTERY DISEASE 02/19/2011  . CVA 03/02/2010  . DEEP VENOUS THROMBOPHLEBITIS, LEG, LEFT 03/02/2010  . DEGENERATIVE DISC DISEASE, CERVICAL SPINE 03/02/2010  . DEGENERATIVE DISC DISEASE, LUMBAR SPINE 03/02/2010  . SHINGLES Feb 27, 2010  . DISCITIS February 27, 2010       G-Codes - 29-Mar-2016 1502    Functional Assessment Tool Used Berg=49/56, Gait speed=2.56 ft/sec   Functional Limitation Mobility: Walking and moving around   Mobility: Walking and Moving Around Current Status [phone]) At least 20 percent but less than 40 percent impaired, limited or restricted   Mobility: Walking and Moving Around Goal Status [phone]) At least 20 percent but less than 40 percent impaired, limited or restricted     Physical Therapy Progress Note  Dates of Reporting Period: 01/27/2016 to 03/29/2016  Objective Reports of Subjective Statement: patient was walking last week without a device for the first time since August, 2016.  She has increased HA today with some dizziness.  Objective Measurements: Berg=49/56, gait speed 2.56 ft/sec  Goal Update: see above  Plan: PT to continue working towards unmet LTGs.  Reason Skilled Services are Required: Patient continuing to make progress with gait, balance and  reduction in dizziness with functional activities.    [place], PT Mar 29, 2016, 2:58 PM  [facility] [address] [place], [place], [address] Phone: [phone]   Fax:  [phone]  Name: [name] MRN: [id number] Date of Birth: [birth year]

## 2016-03-12 ENCOUNTER — Telehealth: Payer: Self-pay | Admitting: Pharmacist Clinician (PhC)/ Clinical Pharmacy Specialist

## 2016-03-12 NOTE — Telephone Encounter (Signed)
Spoke with patient, [employer] approved with copay of $292.73.  Patient states that this would be cost prohibitive.  [place] to call her on Monday and give her options for financial assistance/charitable foundations that can help pay for medication.  Patient to call if none of these work out and we can discuss other options.

## 2016-03-16 ENCOUNTER — Telehealth: Payer: Self-pay | Admitting: Cardiology

## 2016-03-16 NOTE — Telephone Encounter (Signed)
Pt to bring in paperwork for her [employer] patient assistance enrollment. Some of the form information appears that it will need to be filled out by our office.  Forwarding to Erasmo Downer so that she is aware.

## 2016-03-16 NOTE — Telephone Encounter (Signed)
[name] is calling because she is filling out a form to get help w/ medication and its asking for a PTAN# TAX ID# and ICD-10 . Please call   Thanks

## 2016-03-17 ENCOUNTER — Ambulatory Visit: Payer: Medicare Other | Admitting: Rehabilitative and Restorative Service Providers"

## 2016-03-17 DIAGNOSIS — R269 Unspecified abnormalities of gait and mobility: Secondary | ICD-10-CM | POA: Diagnosis not present

## 2016-03-17 DIAGNOSIS — R42 Dizziness and giddiness: Secondary | ICD-10-CM

## 2016-03-17 NOTE — Therapy (Signed)
[facility] [address] [place], [place], [address] Phone: [phone]   Fax:  [phone]  Physical Therapy Treatment  Patient Details  Name: [name] MRN: [id number] Date of Birth: 09/27/1936 Referring Provider: Antonio Ahern, MD  Encounter Date: 03/17/2016      PT End of Session - 03/17/16 1139    Visit Number 11   Number of Visits 16   Date for PT Re-Evaluation 03/27/16   Authorization Type G code every 10th visit   PT Start Time 1107   PT Stop Time 1147   PT Time Calculation (min) 40 min   Equipment Utilized During Treatment Gait belt   Activity Tolerance Patient tolerated treatment well   Behavior During Therapy WFL for tasks assessed/performed      Past Medical History  Diagnosis Date  . Hyperlipidemia   . Osteoarthritis of cervical spine   . History of shingles 11-2008  . Stroke (HCC) 12-2009  . Carotid artery occlusion   . Kidney stones 2012  . Hypertension   . Allergy   . Diabetes mellitus without complication (HCC)     Past Surgical History  Procedure Laterality Date  . Hernia repair  1963    left side  . Abdominal hysterectomy  1973  . Lumbar disc surgery  1975  . Gallbladder surgery  1988    large stone blockage  . Ankle reconstruction  1990    right - 5 screws & plate  . Rotator cuff repair  1994    right  . Spine surgery  2000    C4-C5 fusion  . Stomach surgery  2002    mass rt & left (not cancerous)  . Spine surgery  2003    C6-C7 fusion  . Foot fusion  2005    left - 4 screws  . Spine surgery  2010    replace broken plate (cervical region)  . Spinal fusion surgery  2000    C4 and C5  . Spinal fusion surgery  2003, 2010    C6 and C7  . Cholecystectomy  1988  . Lumbar epidural injection  May-Aug-Sept. 2015/ 08-29-15    There were no vitals filed for this visit.  Visit Diagnosis:  Abnormality of gait  Dizziness and giddiness      Subjective Assessment -  03/17/16 1109    Subjective The patient reports her dizziness is still worse this week reporting "it's been almost constant".  She does not feel that her visual auras are worse this week.  She describes dizziness as sensation of movement and lightheadedness.  She is doing exercises when she can.     Pertinent History Patient reports h/o skull fracture in 1988 when a barbell hit her in the heat with brief loss of consciousness- diagnosed with concussion.     Patient Stated Goals Get my balance back and get rid of the RW.   Currently in Pain? No/denies                Vestibular Assessment - 03/17/16 1113    Vestibular Assessment   General Observation patient had eye exam- WNLs per report   Positional Testing   Sidelying Test Sidelying Right;Sidelying Left   Sidelying Right   Sidelying Right Duration none   Sidelying Right Symptoms No nystagmus   Sidelying Left   Sidelying Left Duration dizziness worse with return to sitting   Sidelying Left Symptoms No nystagmus                   [facility] Adult PT Treatment/Exercise - 03/17/16 1128    Ambulation/Gait   Ambulation/Gait Yes   Ambulation/Gait Assistance 6: Modified independent (Device/Increase time)   Ambulation Distance (Feet) 400 Feet  x 3 reps   Assistive device None   Ambulation Surface Level   Gait Comments Gait with direction changes, forwards/backwards walking, side stepping, slow/fast gait and ball toss for dynamic activities.     Self-Care   Self-Care Other Self-Care Comments   Other Self-Care Comments  Discussed f/u with neurologist if dizziness symptoms remain more constant in nature.  Patient was demonstrating progress and over 2 weeks has had increased symptoms again.  PT and patient discussed migraine (h/o visual auras) triggers and recommended she take note of symptoms.   Neuro Re-ed    Neuro Re-ed Details  Sidestepping, quick direction changes, turns with SBA for safety.     Exercises   Exercises Other  Exercises   Other Exercises  Patient c/o tightness in upper back with neck stretching.  PT provided LE stretching to hamstrings, IT band stretching.  Recommended patient continue to perform prior HEP for hip strengthening.         Vestibular Treatment/Exercise - 03/17/16 1113    Vestibular Treatment/Exercise   Vestibular Treatment Provided Habituation;Gaze                 PT Short Term Goals - 02/24/16 1121    PT SHORT TERM GOAL #1   Title The patient will be indep with HEP for habituation, gaze, balance and general mobility.   Baseline Met on 02/20/2016   Time 4   Period Weeks   Status Achieved   PT SHORT TERM GOAL #2   Title The patient will tolerate gaze x 1 viewing x 30 seconds without subjective reports of dizziness.   Baseline Met on 02/20/2016   Time 4   Period Weeks   Status Achieved   PT SHORT TERM GOAL #3   Title The patient will be further assessed on Berg balance scale and will improve by 4 points from baseline.  (scored 42/56 on 02/06/2016)   Baseline Improved from 42/56 up to 49/56   Time 4   Period Weeks   Status Achieved   PT SHORT TERM GOAL #4   Title The patient will improve gait speed from 2.24 ft/sec to > or equal to 2.6 ft/sec to demo improved community mobility.   Baseline Improved, but not met. (2.56 on 02/20/2016)   Time 4   Period Weeks   Status Partially Met           PT Long Term Goals - 02/24/16 1129    PT LONG TERM GOAL #1   Title The patient will be indep with progression of HEP for post d/c.   Baseline Target date 03/27/2016   Time 8   Period Weeks   Status On-going   PT LONG TERM GOAL #2   Title The patient will reduce dizziness handicap index from 58% to < or equal to 42% to demo decreased self perception of dizziness.   Baseline Target date 03/27/2016   Time 8   Period Weeks   Status On-going   PT LONG TERM GOAL #3   Title The patient will ambulate for short distances (100 ft) on level, well lit surfaces without a device  independently for household mobility.   Baseline Target date 03/27/2016   Time 8   Period Weeks   Status Achieved   PT LONG TERM GOAL #4     Title The patient will improve Berg score 6 or greater points from baseline measure.   Baseline IMproved from 42/56 up to 49/56.    Time 8   Period Weeks   Status Achieved               Plan - 03/17/16 1357    Clinical Impression Statement The patient reports her primary care MD feels that atypical migraines may be playing into current increase in dizziness.  Patient also notes a h/o concussion in late 1980's in which she had a skull fracture and some deficits.  PT to check LTGs at next visit.  Dizziness has increased over the past 2 weeks and she may benefit from further f/u with neurologist to assess migraines further.   PT Next Visit Plan Check LTGs, check HEP, discuss possible return to neurologist.  Gait without device.   Consulted and Agree with Plan of Care Patient        Problem List Patient Active Problem List   Diagnosis Date Noted  . Hyperlipidemia 01/02/2016  . Essential hypertension 01/02/2016  . Aortic insufficiency 01/02/2016  . Dizziness 09/10/2015  . Carotid artery stenosis 10/03/2012  . CAROTID ARTERY DISEASE 02/19/2011  . CVA 03/02/2010  . DEEP VENOUS THROMBOPHLEBITIS, LEG, LEFT 03/02/2010  . DEGENERATIVE DISC DISEASE, CERVICAL SPINE 03/02/2010  . DEGENERATIVE DISC DISEASE, LUMBAR SPINE 03/02/2010  . SHINGLES 02/08/2010  . DISCITIS 02/08/2010    Modene Andy, PT 03/17/2016, 2:01 PM  [facility] [address] [place], [place], [address] Phone: [phone]   Fax:  [phone]  Name: [name] MRN: [id number] Date of Birth: 01/17/1936

## 2016-03-24 ENCOUNTER — Ambulatory Visit: Payer: Medicare Other | Admitting: Rehabilitative and Restorative Service Providers"

## 2016-03-24 DIAGNOSIS — R42 Dizziness and giddiness: Secondary | ICD-10-CM

## 2016-03-24 DIAGNOSIS — R269 Unspecified abnormalities of gait and mobility: Secondary | ICD-10-CM | POA: Diagnosis not present

## 2016-03-24 DIAGNOSIS — R2689 Other abnormalities of gait and mobility: Secondary | ICD-10-CM

## 2016-03-25 ENCOUNTER — Encounter: Payer: Self-pay | Admitting: Rehabilitative and Restorative Service Providers"

## 2016-03-25 NOTE — Therapy (Signed)
[facility] [address] [place], [place], [address] Phone: [phone]   Fax:  [phone]  Physical Therapy Treatment  Patient Details  Name: [name] MRN: [id number] Date of Birth: January 01, 1936 Referring Provider: Heide Spark, MD  Encounter Date: 03/24/2016      PT End of Session - 03/24/16 1156    Visit Number 12   Number of Visits 16   Date for PT Re-Evaluation 03/27/16   Authorization Type G code every 10th visit   PT Start Time 1105   PT Stop Time 1150   PT Time Calculation (min) 45 min   Equipment Utilized During Treatment Gait belt   Activity Tolerance Patient tolerated treatment well   Behavior During Therapy [facility] for tasks assessed/performed      Past Medical History  Diagnosis Date  . Hyperlipidemia   . Osteoarthritis of cervical spine   . History of shingles 11-2008  . Stroke ([place]) 12-2009  . Carotid artery occlusion   . Kidney stones 2012  . Hypertension   . Allergy   . Diabetes mellitus without complication [facility])     Past Surgical History  Procedure Laterality Date  . Hernia repair  1963    left side  . Abdominal hysterectomy  1973  . Lumbar disc surgery  1975  . Gallbladder surgery  1988    large stone blockage  . Ankle reconstruction  1990    right - 5 screws & plate  . Rotator cuff repair  1994    right  . Spine surgery  2000    C4-C5 fusion  . Stomach surgery  2002    mass rt & left (not cancerous)  . Spine surgery  2003    C6-C7 fusion  . Foot fusion  2005    left - 4 screws  . Spine surgery  2010    replace broken plate (cervical region)  . Spinal fusion surgery  2000    C4 and C5  . Spinal fusion surgery  2003, 2010    C6 and C7  . Cholecystectomy  1988  . Lumbar epidural injection  May-Aug-Sept. 2015/ 08-29-15    There were no vitals filed for this visit.  Visit Diagnosis:  Other abnormalities of gait and mobility  Dizziness and giddiness       Subjective Assessment - 03/24/16 1108    Subjective The patient reports she still has dizziness.  "Not as bad as it was in August".  She reports her balance has improved, but she continued with a sensation of "head cold"+ pressure worse when her eyes are closed.  She is walking in the house without the cane. Patient had stomach virus over the weekend.    Patient Stated Goals Get my balance back and get rid of the RW.   Currently in Pain? No/denies            [facility] Adult PT Treatment/Exercise - 03/24/16 1213    Ambulation/Gait   Ambulation/Gait Yes   Ambulation/Gait Assistance 6: Modified independent (Device/Increase time)   Ambulation Distance (Feet) 400 Feet   Assistive device None   Ambulation Surface Level   Gait Comments Gait without device with head turns, slow/fast ambulation, and starts/stops.    Self-Care   Self-Care Other Self-Care Comments   Other Self-Care Comments  Discussed continuing HEP after discharge from therapy.  Further discussed quality of dizziness to ensure not of spinning/vertiginous nature.  Patient's cc is more of a sensation of "pressure" in  her head.  PT reviewed fall prevention recommending use of device only when she feels that she needs it.     Neuro Re-ed    Neuro Re-ed Details  Reviewed all HEP for post d/c--see in patient instructions.    Exercises   Exercises Other Exercises   Other Exercises  Reviewed neck AROM and discussed continuing work on posture to maintain current position and prevent further rounding or loss of neck range.                 PT Education - 03/25/16 1156    Education provided Yes   Education Details post d/c home exercises   Person(s) Educated Patient   Methods Explanation   Comprehension Verbalized understanding          PT Short Term Goals - 02/24/16 1121    PT SHORT TERM GOAL #1   Title The patient will be indep with HEP for habituation, gaze, balance and general mobility.   Baseline Met on 02/20/2016    Time 4   Period Weeks   Status Achieved   PT SHORT TERM GOAL #2   Title The patient will tolerate gaze x 1 viewing x 30 seconds without subjective reports of dizziness.   Baseline Met on 02/20/2016   Time 4   Period Weeks   Status Achieved   PT SHORT TERM GOAL #3   Title The patient will be further assessed on Berg balance scale and will improve by 4 points from baseline.  (scored 42/56 on 02/06/2016)   Baseline Improved from 42/56 up to 49/56   Time 4   Period Weeks   Status Achieved   PT SHORT TERM GOAL #4   Title The patient will improve gait speed from 2.24 ft/sec to > or equal to 2.6 ft/sec to demo improved community mobility.   Baseline Improved, but not met. (2.56 on 02/20/2016)   Time 4   Period Weeks   Status Partially Met           PT Long Term Goals - 03/24/16 1156    PT LONG TERM GOAL #1   Title The patient will be indep with progression of HEP for post d/c.   Baseline MET.    Time 8   Period Weeks   Status Achieved   PT LONG TERM GOAL #2   Title The patient will reduce dizziness handicap index from 58% to < or equal to 42% to demo decreased self perception of dizziness.   Baseline Partially met with patient scoring 44%.    Time 8   Period Weeks   Status Partially Met   PT LONG TERM GOAL #3   Title The patient will ambulate for short distances (100 ft) on level, well lit surfaces without a device independently for household mobility.   Baseline Target date 03/27/2016   Time 8   Period Weeks   Status Achieved   PT LONG TERM GOAL #4   Title The patient will improve Berg score 6 or greater points from baseline measure.   Baseline IMproved from 42/56 up to 49/56.    Time 8   Period Weeks   Status Achieved               Plan - 03/25/16 1206    Clinical Impression Statement The patient met 3/4 LTGs and all STGs.  She is continuing with what she describes as "dizziness", however when further described, she notes it as more of a pressure sensation like  she has a head cold.  She is also continuing with visual aura with intermittent headaches on the left side of her head.   Patient has improved overall mobility, but still feels limitations due to pressure sensation.    PT Next Visit Plan Discharge today.   Consulted and Agree with Plan of Care Patient        Problem List Patient Active Problem List   Diagnosis Date Noted  . Hyperlipidemia 01/02/2016  . Essential hypertension 01/02/2016  . Aortic insufficiency 01/02/2016  . Dizziness 09/10/2015  . Carotid artery stenosis 10/03/2012  . CAROTID ARTERY DISEASE 02/19/2011  . CVA 03/02/2010  . DEEP VENOUS THROMBOPHLEBITIS, LEG, LEFT 03/02/2010  . DEGENERATIVE DISC DISEASE, CERVICAL SPINE 03/02/2010  . DEGENERATIVE DISC DISEASE, LUMBAR SPINE 03/02/2010  . SHINGLES 02/08/2010  . DISCITIS 02/08/2010    WEAVER,CHRISTINA, PT 03/25/2016, 12:16 PM  [facility] [address] [place], [place], [address] Phone: [phone]   Fax:  [phone]  Name: [name] MRN: [id number] Date of Birth: 06-14-1936

## 2016-03-25 NOTE — Therapy (Signed)
[facility] [address] [place], [place], [address] Phone: [phone]   Fax:  [phone]  Patient Details  Name: [name] MRN: [id number] Date of Birth: 1936-01-26 Referring Provider:  No ref. provider found  Encounter Date: last encounter 03/24/2016  PHYSICAL THERAPY DISCHARGE SUMMARY  Visits from Start of Care: 12  Current functional level related to goals / functional outcomes:     PT Short Term Goals - 02/24/16 1121    PT SHORT TERM GOAL #1   Title The patient will be indep with HEP for habituation, gaze, balance and general mobility.   Baseline Met on 02/20/2016   Time 4   Period Weeks   Status Achieved   PT SHORT TERM GOAL #2   Title The patient will tolerate gaze x 1 viewing x 30 seconds without subjective reports of dizziness.   Baseline Met on 02/20/2016   Time 4   Period Weeks   Status Achieved   PT SHORT TERM GOAL #3   Title The patient will be further assessed on Berg balance scale and will improve by 4 points from baseline.  (scored 42/56 on 02/06/2016)   Baseline Improved from 42/56 up to 49/56   Time 4   Period Weeks   Status Achieved   PT SHORT TERM GOAL #4   Title The patient will improve gait speed from 2.24 ft/sec to > or equal to 2.6 ft/sec to demo improved community mobility.   Baseline Improved, but not met. (2.56 on 02/20/2016)   Time 4   Period Weeks   Status Partially Met         PT Long Term Goals - 03/24/16 1156    PT LONG TERM GOAL #1   Title The patient will be indep with progression of HEP for post d/c.   Baseline MET.    Time 8   Period Weeks   Status Achieved   PT LONG TERM GOAL #2   Title The patient will reduce dizziness handicap index from 58% to < or equal to 42% to demo decreased self perception of dizziness.   Baseline Partially met with patient scoring 44%.    Time 8   Period Weeks   Status Partially Met   PT LONG TERM GOAL #3   Title The patient will  ambulate for short distances (100 ft) on level, well lit surfaces without a device independently for household mobility.   Baseline Target date 03/27/2016   Time 8   Period Weeks   Status Achieved   PT LONG TERM GOAL #4   Title The patient will improve Berg score 6 or greater points from baseline measure.   Baseline IMproved from 42/56 up to 49/56.    Time 8   Period Weeks   Status Achieved        Remaining deficits: Patient continues with intermittent episodes of dizziness further described as "pressure" in her head and a sensation of having a bad head cold.   **The patient initially had a great reduction in dizziness with PT, then after 3/15 appointment has had more of a constant sensation of dizziness that returned.  She was referred back to primary care MD due to new onset of L side head pain, mildly elevated BP, and reports of "losing her place" at times during conversation.   Dizziness has continued since mid March at more of a constant level with intermittent increases in intensity.    Education / Equipment: HEP, home safety,  self mgmt of symptoms.  Plan: Patient agrees to discharge.  Patient goals were partially met. Patient is being discharged due to meeting the stated rehab goals.  ?????       Thank you for the referral of this patient. Rudell Cobb, MPT  [facility] 03/25/2016, 12:19 PM  [facility] [address] [place], [place], [address] Phone: [phone]   Fax:  [phone]

## 2016-04-13 ENCOUNTER — Telehealth: Payer: Self-pay | Admitting: Pharmacist Clinician (PhC)/ Clinical Pharmacy Specialist

## 2016-04-13 DIAGNOSIS — E785 Hyperlipidemia, unspecified: Secondary | ICD-10-CM

## 2016-04-13 MED ORDER — EVOLOCUMAB 140 MG/ML ~~LOC~~ SOAJ: 140.0000 mg | SUBCUTANEOUS | Status: DC

## 2016-04-13 NOTE — Telephone Encounter (Signed)
Patient received assistance thru Dec 2017, started on 4-13.  Will repeat labs end of June.  Patient aware.

## 2016-05-20 ENCOUNTER — Ambulatory Visit (INDEPENDENT_AMBULATORY_CARE_PROVIDER_SITE_OTHER): Payer: Medicare Other | Admitting: Neurology

## 2016-05-20 ENCOUNTER — Encounter: Payer: Self-pay | Admitting: Neurology

## 2016-05-20 VITALS — BP 200/77 | HR 72 | Ht <= 58 in | Wt 143.2 lb

## 2016-05-20 DIAGNOSIS — I671 Cerebral aneurysm, nonruptured: Secondary | ICD-10-CM | POA: Diagnosis not present

## 2016-05-20 DIAGNOSIS — G450 Vertebro-basilar artery syndrome: Secondary | ICD-10-CM

## 2016-05-20 DIAGNOSIS — R4189 Other symptoms and signs involving cognitive functions and awareness: Secondary | ICD-10-CM

## 2016-05-20 DIAGNOSIS — R42 Dizziness and giddiness: Secondary | ICD-10-CM

## 2016-05-20 DIAGNOSIS — R270 Ataxia, unspecified: Secondary | ICD-10-CM | POA: Diagnosis not present

## 2016-05-20 DIAGNOSIS — I679 Cerebrovascular disease, unspecified: Secondary | ICD-10-CM | POA: Diagnosis not present

## 2016-05-20 MED ORDER — DIVALPROEX SODIUM ER 250 MG PO TB24: 500.0000 mg | ORAL_TABLET | Freq: Every day | ORAL | Status: DC

## 2016-05-20 NOTE — Patient Instructions (Signed)
Remember to drink plenty of fluid, eat healthy meals and do not skip any meals. Try to eat protein with a every meal and eat a healthy snack such as fruit or nuts in between meals. Try to keep a regular sleep-wake schedule and try to exercise daily, particularly in the form of walking, 20-30 minutes a day, if you can.   As far as your medications are concerned, I would like to suggest: Start Depakote 1 pill at night then in 2 weeks take 2 pills at night  As far as diagnostic testing: MRI of the brain and blood vessels  I would like to see you back 3 months, sooner if we need to. Please call us with any interim questions, concerns, problems, updates or refill requests.   Our phone number is [phone]. We also have an after hours call service for urgent matters and there is a physician on-call for urgent questions. For any emergencies you know to call 911 or go to the nearest emergency room

## 2016-05-20 NOTE — Progress Notes (Signed)
[facility]    Provider:  Dr Jaynee Eagles Referring Provider: Derinda Late, MD Primary Care Physician:  Marylene Land, MD   CC: dizziness  Interval History 05/20/2016: Patient returns today with continued symptoms. She is here with her husband and daughter. She has had extensive testing without etiology for her persistent dizziness and headaches. Symptoms started after an epidural steroid injection and CSF leak was in the differential for cause is. However CT myelogram did not show any csf leak. Reviewed all the testing performed today including imaging, vascular studies, ENT evaluation, cardiac evaluation. Reviewed with patient and family. She has been participating in physical therapy for her dizziness which helped however her symptoms worsened after stopping. Today she reports that she is not improved.  Physical therapy helped for a while. Dizziness and being unstable started back again after physical therapy completed. She feels like she has been in a fog for a long time. She feels dizzy even sititng here, like a cloud has come over her head. She has to make herself do things. She doesn;t feel right. She sometimes feels very lightheaded like she is going to pass out, She feels like the "lights were going off" like she was going to faint during one occasion if she described it. We have done orthostatics as well which do not show orthostatic hypotension. Blood pressure is elevated today systolic 726 and patient tells me they have tried decreasing her blood pressure medications to see if this helps. She is complaining of multiple other complaints today as well, here with daughter and husband. Does not sound classically like vestibular migraines or vestibular component of migraines however patient does have a history of migraines. With the current headache she denies light sensitivity or sound sensitivity. However we can try treating her for migraines and see what happens.  Interval  history 01/07/2016  The dizziness persists. The headaches are better since the patch. Blood patch was on October the 9th. She still has the dizziness. She feels lightheaded continuously. Even when sitting. She had food poisoning after eating a burger. She was having pain and went to the ED and she was treated for Diverticulitis. She was given cipro and flagyl. She had a reaction to the Flagyl. She stopped the Doxazosin. Her orthostatics are normal. She has been to ENT. She has been extensively tested. She went to Cardiology. She feels unsteady with the lightheadedness. She uses a walker. We discussed possible CT myelogram at length, but need to discuss with Dr. Teryl Lucy (interventional radiology) and Dr. Sandi Mariscal if CT Myelogram would be beneficial to evaluate for csf leak.  HPI: [name] is a 80 y.o. female here as a referral from Dr. Sandi Mariscal for dizziness and presyncope. She has a past medical history of lumbosacral degenerative disease, hyperlipidemia, hypertension, fibromyalgia, cervical radiculopathy, migraines, left brachial artery thrombosis, vasovagal syncope, right carpal tunnel syndrome, intermittent benign positional vertigo, type 2 diabetes. She had an epidural steroid injection on September 2nd. Since then having headaches and dizziness. Extensive workup by Dr. Sandi Mariscal has been negative to date. She had carotid artery studies, echo, MRI and now is going to the ENT. She describes it as being lightheaded. No room spinning. Bending over makes her dizzy. About a week after the epidural steroid injection is when it all started. She is on Lyrica for her back pain but she has been on it for 4-5 years, no new medications or other known inciting factors except for the epidural steroid injection. No symptoms when she stands  up. Headaches are occasionally. Drinking coffee helps her headache go away. Headaches occur when standing or sitting, no headache when laying down. She sometimes wakes up from a  deep sleep with a headache, she snores but she denies excessive daytime sleepiness and is very energetic. Headaches started in the last week. She feels like she is in a cloud, a terrible head cold floating around. She was walking from the living room to the kitchen and she was sitting and all of a sudden she felt like she was going to faint. Then she had the pins and needles in the left hand which have since resolved. The headaches are in the temples. No vision changes. No jaw claudication. Patient reports that she had similar symptoms after last epidural steroid injection in May 2016. At that time the symptoms lasted a few weeks. Denies dysarthria, dysphagia, aphasia, facial droop, new weakness, hearing changes, vision changes, neck stiffness, fever, chills or other systemic symptoms.  Reviewed notes, labs and imaging from outside physicians, which showed: Per Dr. Deboraha Sprang notes, patient had an epidural injection for her lumbar radiculopathy on 09/12/2015. Since then she's had frequent symptoms of dizziness. She's had presyncopal episodes, when she was bending forward. She sometimes feels to be worse with rapid changes of position of her head. No true vertigo.  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 Multiple studies recently included a Holter report which showed rare PVCs and PACs, and echocardiogram showed mild-to-moderate aortic regurgitation was otherwise unremarkable, and MRI and MRA of the brain scan showed an old right subcortical small vessel CVA and mild atrophy, there was mild to moderate left P2 PCA stenosis. Carotid Doppler study showed stable 40-59% right ICA stenosis and less than 40% left ICA stenosis.  MRI of the brain 08/2015: Personally reviewed images and agree with following. : 1. No acute intracranial abnormality or mass. 2. No medium or large vessel occlusion or significant proximal stenosis.  Mild-to-moderate distal left P2 PCA stenosis. Appears artifactual. 3. Unchanged, tiny bulge along the left supraclinoid ICA. Also few small foci of T2 hyperintensity are again seen in the cerebral white matter which are nonspecific, and not greater than expected for patient's age  She has an allergy to Plavix and she is currently on aspirin.  Review of Systems: Patient complains of symptoms per HPI as well as the following symptoms: Blurred vision, fatigue, palpitations, murmur, feeling cold, incontinence, joint pain, aching muscles, sleepiness, memory loss, confusion, headache, numbness, weakness, dizziness, decreased energy. Pertinent negatives per HPI. All others negative.    Social History   Social History  . Marital Status: Married    Spouse Name: Pilar Plate   . Number of Children: 3  . Years of Education: 12   Occupational History  . Not on file.   Social History Main Topics  . Smoking status: Former Smoker -- 1.00 packs/day for 3 years    Types: Cigarettes    Quit date: 02/08/1979  . Smokeless tobacco: Never Used  . Alcohol Use: No  . Drug Use: No  . Sexual Activity: Not on file   Other Topics Concern  . Not on file   Social History Narrative   Lives at home with husband, Pilar Plate.   Caffeine use: 2 cups coffee per day.    Family History  Problem Relation Age of Onset  . Dementia Mother   . Heart attack Father     Past Medical History  Diagnosis Date  .  Hyperlipidemia   . Osteoarthritis of cervical spine   . History of shingles 11-2008  . Stroke ([place]) 12-2009  . Carotid artery occlusion   . Kidney stones 2012  . Hypertension   . Allergy   . Diabetes mellitus without complication [facility])     Past Surgical History  Procedure Laterality Date  . Hernia repair  1963    left side  . Abdominal hysterectomy  1973  . Lumbar disc surgery  1975  . Gallbladder surgery  1988    large stone blockage  . Ankle reconstruction  1990    right - 5 screws & plate  .  Rotator cuff repair  1994    right  . Spine surgery  2000    C4-C5 fusion  . Stomach surgery  2002    mass rt & left (not cancerous)  . Spine surgery  2003    C6-C7 fusion  . Foot fusion  2005    left - 4 screws  . Spine surgery  2010    replace broken plate (cervical region)  . Spinal fusion surgery  2000    C4 and C5  . Spinal fusion surgery  2003, 2010    C6 and C7  . Cholecystectomy  1988  . Lumbar epidural injection  May-Aug-Sept. 2015/ 08-29-15    Current Outpatient Prescriptions  Medication Sig Dispense Refill  . aspirin 81 MG tablet Take 81 mg by mouth daily. Per patient, takes one 81 mg daily    . calcium carbonate (OS-CAL) 600 MG TABS tablet Take 600 mg by mouth 2 (two) times daily with a meal.    . colestipol (COLESTID) 1 G tablet Take 1 g by mouth 2 (two) times daily.    . Evolocumab (REPATHA SURECLICK) 450 MG/ML SOAJ Inject 140 mg into the skin every 14 (fourteen) days. 2 pen 12  . ezetimibe (ZETIA) 10 MG tablet Take 1 tablet (10 mg total) by mouth daily. 90 tablet 3  . LORazepam (ATIVAN) 1 MG tablet Take 1 mg by mouth daily.    Marland Kitchen losartan (COZAAR) 100 MG tablet Take 100 mg by mouth daily.    Marland Kitchen LYRICA 50 MG capsule Take 1 tablet by mouth daily. Take 1 tab daily as needed for back pain    . meloxicam (MOBIC) 7.5 MG tablet Take 1 tablet by mouth daily as needed. Take 1 tab daily    . Multiple Vitamin (MULTIVITAMIN) tablet Take 1 tablet by mouth daily.    . Omega-3 Fatty Acids (FISH OIL PO) Take 1,400 mg by mouth daily.     Marland Kitchen omeprazole (PRILOSEC) 20 MG capsule Take 20 mg by mouth daily.    . polyethylene glycol (MIRALAX / GLYCOLAX) packet Take 17 g by mouth daily.    . VOLTAREN 1 % GEL Apply 2 g topically daily as needed. For pain     No current facility-administered medications for this visit.    Allergies as of 05/20/2016 - Review Complete 03/24/2016  Allergen Reaction Noted  . Plavix [clopidogrel bisulfate] Other (See Comments) 02/09/2012  . Prinivil  [lisinopril] Swelling 02/09/2012  . Sulfa antibiotics Hives 02/09/2012  . Metronidazole  01/07/2016    Vitals: There were no vitals taken for this visit. Last Weight:  Wt Readings from Last 1 Encounters:  02/03/16 145 lb 4.8 oz (65.908 kg)   Last Height:   Ht Readings from Last 1 Encounters:  02/03/16 _0  (1.473 m)       Cranial Nerves:  The pupils  are equal, round, and reactive to light. Attempted fundoscopic exam, coul dnot visualize. Visual fields are full to finger confrontation. Extraocular movements are intact. Trigeminal sensation is intact and the muscles of mastication are normal. The face is symmetric. The palate elevates in the midline. Hearing intact. Voice is normal. Shoulder shrug is normal. The tongue has normal motion without fasciculations.   Coordination:  No dysmetria  Gait:: very mild shuffling, uses walker    Motor Observation:   No asymmetry, no atrophy, and no involuntary movements noted. Tone:  Normal muscle tone.   Posture:  Posture is normal. normal erect   Strength:  Right proximal UE weakness(chronic). Bilateral hip flexion weakness otherwise strength is V/V in the upper and lower limbs.    Sensation: intact to LT   Reflex Exam:  DTR's:  Deep tendon reflexes in the upper and lower extremities are brisk for age and medical conditions bilaterally.  Toes:  The toes are equivocal bilaterally.  Clonus:  Clonus is absent.     Assessment/Plan: 80 year old female who presents with dizziness, feeling like she is in a fog, and headache after receiving a lumbar epidural steroid injection.  MRI of the brain and MRA of the head were unremarkable, she has been evaluated by ENT, she has had cardiac workup and vascular evaluation. She has a past medical history of lumbosacral degenerative disease, hyperlipidemia, hypertension, fibromyalgia, cervical radiculopathy, migraines, left brachial artery thrombosis,  vasovagal syncope, right carpal tunnel syndrome, intermittent benign positional vertigo, type 2 diabetes.  Extensive workup by Dr. Sandi Mariscal has been negative to date MRI, MRA, carotid artery studies, echo, ekg, holter monitor. CT myelogram was negative for CSF leak. Vestibular therapy helped while she was in it but symptoms return. ESR and CRP were negative.  Does not sound classically like vestibular migraines or a vestibular component of migraines but patient does have a history of migraines and dizziness as a component of this disorder. With the current headache she denies light sensitivity or sound sensitivity. However we can try treating her for migraines and see what happens. We'll start Depakote 250 mg and increase to 500 mg extended release at night. Discussed side effects with family. Can repeat MRI of the brain as brain as well despite low suspicion for new intracranial etiology, unclear what the etiology is or what we can do next. Had a long discussion with family and patient. Family was questioning whether stress could cause the symptoms.   Addendum from dr blomgren: b12 935, tsh 3.3 Sarina Ill, MD  [facility] [address] [place], [place] [address]  Phone [phone] Fax [phone]  A total of 60 minutes was spent face-to-face with this patient and her family. Over half this time was spent on counseling patient on the dizziness and migraine diagnosis and different diagnostic and therapeutic options available.

## 2016-05-21 ENCOUNTER — Ambulatory Visit
Admission: RE | Admit: 2016-05-21 | Discharge: 2016-05-21 | Disposition: A | Discharge status: Home / Self Care | Payer: Medicare Other | Source: Ambulatory Visit | Attending: Family Medicine | Admitting: Family Medicine

## 2016-05-21 ENCOUNTER — Other Ambulatory Visit: Payer: Self-pay | Admitting: Family Medicine

## 2016-05-21 DIAGNOSIS — M542 Cervicalgia: Principal | ICD-10-CM

## 2016-05-21 DIAGNOSIS — G8929 Other chronic pain: Secondary | ICD-10-CM

## 2016-05-31 ENCOUNTER — Telehealth: Payer: Self-pay | Admitting: Pharmacist Clinician (PhC)/ Clinical Pharmacy Specialist

## 2016-05-31 DIAGNOSIS — E785 Hyperlipidemia, unspecified: Secondary | ICD-10-CM

## 2016-05-31 MED ORDER — EVOLOCUMAB WITH INFUSOR 420 MG/3.5ML ~~LOC~~ SOCT: 420.0000 mg | SUBCUTANEOUS | Status: DC

## 2016-05-31 NOTE — Telephone Encounter (Signed)
repatha labs

## 2016-05-31 NOTE — Telephone Encounter (Signed)
Took Repatha monthly injections on April 13 then May 25.  Next dose June 25, patient to go to lab after that and have cholesterol drawn.

## 2016-06-23 LAB — HEPATIC FUNCTION PANEL
ALBUMIN: 3.9 g/dL (ref 3.6–5.1)
ALK PHOS: 71 U/L (ref 33–130)
ALT: 13 U/L (ref 6–29)
AST: 15 U/L (ref 10–35)
Bilirubin, Direct: 0.1 mg/dL (ref <=0.2)
Indirect Bilirubin: 0.4 mg/dL (ref 0.2–1.2)
TOTAL PROTEIN: 6.4 g/dL (ref 6.1–8.1)
Total Bilirubin: 0.5 mg/dL (ref 0.2–1.2)

## 2016-06-23 LAB — LIPID PANEL
CHOL/HDL RATIO: 2.3 ratio (ref <=5.0)
Cholesterol: 149 mg/dL (ref 125–200)
HDL: 65 mg/dL (ref >=46)
LDL Cholesterol: 60 mg/dL (ref <130)
TRIGLYCERIDES: 122 mg/dL (ref <150)
VLDL: 24 mg/dL (ref <30)

## 2016-07-08 ENCOUNTER — Telehealth: Payer: Self-pay | Admitting: Cardiology

## 2016-07-08 ENCOUNTER — Telehealth: Payer: Self-pay | Admitting: Neurology

## 2016-07-08 NOTE — Telephone Encounter (Signed)
New message       The office needs all the lab work that has recently been done faxed to the office, [phone].

## 2016-07-08 NOTE — Telephone Encounter (Signed)
Pt called in to let Dr. Jaynee Eagles know that she can not take divalproex (DEPAKOTE ER) 250 MG 24 hr tablet. May call pt

## 2016-07-08 NOTE — Telephone Encounter (Signed)
I think that she should discuss a referral to an academic center with Dr. Sandi Mariscal, possibly that specializes in dizziness and vestibular symptoms. I'm really not sure that I have any more ideas for her unfortunately. thanks

## 2016-07-08 NOTE — Telephone Encounter (Signed)
Dr Jaynee Eagles- please advise Called and spoke to pt.  She stated the depakote made her feel worse. She took as prescribed by Dr Jaynee Eagles. 250mg  (2 tabs daily). She took medication like this for about 3-4 days. She could not tolerate. She then went to PCP to address it and he told her to quarter them d/t them being too strong he thought. She tried this for about a week and a half. She still could not tolerate the medication. She stated that her headaches have improved, however she is still having dizziness. She said it is not like the room is spinning. It is more like "whole head clogged up". She said the pains in head have quit. Not having headaches like she used too. She is wanting to know where to go from here. She said she has not completed MRI's yet but she plans on getting them done after her husband have his surgery on 07/15/16. She is currently using a walker to ambulate still.

## 2016-07-09 NOTE — Telephone Encounter (Signed)
I faxed per pt request this note, and added ofv note to fax for Dr. Sandi Mariscal.  [phone].  Received fax confirmation.

## 2016-07-09 NOTE — Telephone Encounter (Signed)
I called and spoke to pt and relayed for her to call Dr. Sandi Mariscal for referral to academic center for dizziness/vestibular sx.  Pt was very appreciative to Dr. Jaynee Eagles and Terrence Dupont for trying to help her.

## 2016-07-12 NOTE — Telephone Encounter (Signed)
Labs faxed via [facility]-

## 2016-07-21 ENCOUNTER — Other Ambulatory Visit: Payer: Self-pay | Admitting: Family Medicine

## 2016-07-21 ENCOUNTER — Ambulatory Visit
Admission: RE | Admit: 2016-07-21 | Discharge: 2016-07-21 | Disposition: A | Discharge status: Home / Self Care | Payer: Medicare Other | Source: Ambulatory Visit | Attending: Family Medicine | Admitting: Family Medicine

## 2016-07-21 DIAGNOSIS — Z8719 Personal history of other diseases of the digestive system: Secondary | ICD-10-CM

## 2016-07-21 DIAGNOSIS — R1032 Left lower quadrant pain: Secondary | ICD-10-CM

## 2016-07-21 MED ORDER — IOPAMIDOL (ISOVUE-300) INJECTION 61%
80.0000 mL | Freq: Once | INTRAVENOUS | Status: AC | PRN
  Administered 2016-07-21: 80 mL via INTRAVENOUS

## 2016-07-26 ENCOUNTER — Ambulatory Visit
Admission: RE | Admit: 2016-07-26 | Discharge: 2016-07-26 | Disposition: A | Discharge status: Home / Self Care | Payer: Medicare Other | Source: Ambulatory Visit | Attending: Neurology | Admitting: Neurology

## 2016-07-26 DIAGNOSIS — I679 Cerebrovascular disease, unspecified: Secondary | ICD-10-CM

## 2016-07-26 DIAGNOSIS — I671 Cerebral aneurysm, nonruptured: Secondary | ICD-10-CM

## 2016-07-26 DIAGNOSIS — G450 Vertebro-basilar artery syndrome: Secondary | ICD-10-CM

## 2016-07-26 DIAGNOSIS — R270 Ataxia, unspecified: Secondary | ICD-10-CM

## 2016-07-26 DIAGNOSIS — R4189 Other symptoms and signs involving cognitive functions and awareness: Secondary | ICD-10-CM

## 2016-07-26 DIAGNOSIS — R42 Dizziness and giddiness: Secondary | ICD-10-CM

## 2016-07-26 MED ORDER — GADOBENATE DIMEGLUMINE 529 MG/ML IV SOLN
6.0000 mL | Freq: Once | INTRAVENOUS | Status: AC | PRN
  Administered 2016-07-26: 6 mL via INTRAVENOUS

## 2016-07-27 ENCOUNTER — Telehealth: Payer: Self-pay | Admitting: *Deleted

## 2016-07-27 NOTE — Telephone Encounter (Signed)
LVM for pt to call about results. Gave [facility] phone number.  

## 2016-07-27 NOTE — Telephone Encounter (Signed)
-----   Message from Melvenia Beam, MD sent at 07/26/2016  6:32 PM EDT ----- There have been no changes in her brain imaging since 08/2015. MRI of the brain is normal for age. No significant stenosis or problems with the blood vessels in her head to explain her symptoms. No changes since 08/2015. thanks

## 2016-07-27 NOTE — Telephone Encounter (Signed)
Called pt back. Relayed results per Dr Jaynee Eagles note. She verbalized understanding. She states that Dr Sandi Mariscal told her that he thinks she may have an extreme anxiety disorder. She has an appt at [facility] at the balance disorder clinic on 08/03/16. She verified appt for 8/29 at 1130 with Dr Jaynee Eagles and stated she wants to f/u with Dr Jaynee Eagles after going to [facility].

## 2016-07-27 NOTE — Telephone Encounter (Signed)
Patient returned Emma's call ° °

## 2016-08-12 ENCOUNTER — Encounter: Payer: Self-pay | Admitting: Cardiology

## 2016-08-12 ENCOUNTER — Ambulatory Visit (INDEPENDENT_AMBULATORY_CARE_PROVIDER_SITE_OTHER): Payer: Medicare Other | Admitting: Cardiology

## 2016-08-12 VITALS — BP 154/84 | HR 90 | Ht <= 58 in | Wt 139.0 lb

## 2016-08-12 DIAGNOSIS — I6523 Occlusion and stenosis of bilateral carotid arteries: Secondary | ICD-10-CM | POA: Diagnosis not present

## 2016-08-12 DIAGNOSIS — I2584 Coronary atherosclerosis due to calcified coronary lesion: Secondary | ICD-10-CM

## 2016-08-12 DIAGNOSIS — E785 Hyperlipidemia, unspecified: Secondary | ICD-10-CM | POA: Diagnosis not present

## 2016-08-12 DIAGNOSIS — I2510 Atherosclerotic heart disease of native coronary artery without angina pectoris: Secondary | ICD-10-CM

## 2016-08-12 DIAGNOSIS — I10 Essential (primary) hypertension: Secondary | ICD-10-CM | POA: Diagnosis not present

## 2016-08-12 MED ORDER — AMLODIPINE BESYLATE 5 MG PO TABS: 5.0000 mg | ORAL_TABLET | Freq: Every day | ORAL | 6 refills | Status: DC

## 2016-08-12 NOTE — Assessment & Plan Note (Signed)
Not well controlled

## 2016-08-12 NOTE — Patient Instructions (Signed)
Medication Change  Start taking Amlodipine 5 mg (1 tab) daily.   (Call to let us know how Blood Pressure is doing in a couple of weeks)   Follow-Up  Your physician wants you to follow-up in: 6 months with Dr. Stanford Breed. You will receive a reminder letter in the mail two months in advance. If you don't receive a letter, please call our office to schedule the follow-up appointment.   If you need a refill on your cardiac medications before your next appointment, please call your pharmacy.

## 2016-08-12 NOTE — Assessment & Plan Note (Signed)
LDL 60 on Repatha

## 2016-08-12 NOTE — Assessment & Plan Note (Signed)
Due for dopplers in Oct

## 2016-08-12 NOTE — Progress Notes (Signed)
08/12/2016 [name]   10-17-1936  [id number]  Primary Physician Marylene Land, MD Primary Cardiologist: Dr Stanford Breed  HPI:  Pleasant 80 y/o female with a history of NIDDM (diet controlled), dyslipidemia with statin intolerance-now on Repatha, HTN, CVA 2011, CAD-asymptomatic, (incidental finding on abdominal CT), moderate CA disease followed by Clemon Chambers, NP at vascular surgery, and chronic dizziness- being evaluated at [facility] She saw Dr Stanford Breed in jan 2017. We were able to get her on Repatha and her LDL was 60 in June. She denies chest pain or DOE.    Current Outpatient Prescriptions  Medication Sig Dispense Refill  . aspirin 81 MG tablet Take 81 mg by mouth daily. Per patient, takes one 81 mg daily    . calcium carbonate (OS-CAL) 600 MG TABS tablet Take 600 mg by mouth 2 (two) times daily with a meal.    . Evolocumab with Infusor 420 MG/3.5ML SOCT Inject 420 mg into the skin every 30 (thirty) days.     Marland Kitchen LORazepam (ATIVAN) 1 MG tablet Take 1 mg by mouth daily.    Marland Kitchen LYRICA 50 MG capsule Take 1 tablet by mouth daily. Take 1 tab daily as needed for back pain    . meloxicam (MOBIC) 7.5 MG tablet Take 1 tablet by mouth daily as needed. Take 1 tab daily    . Multiple Vitamin (MULTIVITAMIN) tablet Take 1 tablet by mouth daily.    . Omega-3 Fatty Acids (FISH OIL PO) Take 1,400 mg by mouth daily.     Marland Kitchen omeprazole (PRILOSEC) 20 MG capsule Take 20 mg by mouth daily.    . polyethylene glycol (MIRALAX / GLYCOLAX) packet Take 17 g by mouth daily.    . vitamin B-12 (CYANOCOBALAMIN) 1000 MCG tablet Take 1,000 mcg by mouth daily.    . VOLTAREN 1 % GEL Apply 2 g topically daily as needed. For pain    . amLODipine (NORVASC) 5 MG tablet Take 1 tablet (5 mg total) by mouth daily. 30 tablet 6   No current facility-administered medications for this visit.     Allergies  Allergen Reactions  . Plavix [Clopidogrel Bisulfate] Other (See Comments)    Shakes and teeth chattering, "just didn't  feel right"  . Prinivil [Lisinopril] Swelling    Lip swelling Lip swelling  . Sulfa Antibiotics Hives  . Sulfasalazine Hives  . Metronidazole     She got very sick    Social History   Social History  . Marital status: Married    Spouse name: Pilar Plate   . Number of children: 3  . Years of education: 12   Occupational History  . Not on file.   Social History Main Topics  . Smoking status: Former Smoker    Packs/day: 1.00    Years: 3.00    Types: Cigarettes    Quit date: 02/08/1979  . Smokeless tobacco: Never Used  . Alcohol use No  . Drug use: No  . Sexual activity: Not on file   Other Topics Concern  . Not on file   Social History Narrative   Lives at home with husband, Pilar Plate.   Caffeine use: 2 cups coffee per day.     Review of Systems: General: negative for chills, fever, night sweats or weight changes.  Cardiovascular: negative for chest pain, dyspnea on exertion, edema, orthopnea, palpitations, paroxysmal nocturnal dyspnea or shortness of breath Dermatological: negative for rash Respiratory: negative for cough or wheezing Urologic: negative for hematuria Abdominal: negative for nausea, vomiting, diarrhea,  bright red blood per rectum, melena, or hematemesis Neurologic: negative for visual changes, syncope, or dizziness She has significant rotator cuff issues Lt shoulder. She has chronic dizziness and uses a walker. All other systems reviewed and are otherwise negative except as noted above.    Blood pressure (!) 154/84, pulse 90, height 4\' 10"  (1.473 m), weight 139 lb (63 kg).  General appearance: alert, cooperative, no distress and mildly obese Neck: no carotid bruit and no JVD Lungs: clear to auscultation bilaterally Heart: regular rate and rhythm Extremities: no edema Skin: Skin color, texture, turgor normal. No rashes or lesions Neurologic: Grossly normal  EKG NSR, PACs  ASSESSMENT AND PLAN:   Hyperlipidemia LDL 60 on Repatha  Essential  hypertension Not well controlled  Carotid artery stenosis Due for dopplers in Oct  CAD (coronary artery disease) Incidental finding on abdominal CT- no symptoms   PLAN  I started [name] on Norvasc 5 mg. She had "lip swelling" with Prinivil in the past. I considered a beta blocker but felt that might complicate her work up for dizziness. Her husband will check her B/P in 2 weeks and let us know how she is doing.   Kerin Ransom PA-C 08/12/2016 11:18 AM

## 2016-08-12 NOTE — Assessment & Plan Note (Signed)
Incidental finding on abdominal CT- no symptoms

## 2016-08-24 ENCOUNTER — Ambulatory Visit (INDEPENDENT_AMBULATORY_CARE_PROVIDER_SITE_OTHER): Payer: Medicare Other | Admitting: Neurology

## 2016-08-24 ENCOUNTER — Encounter: Payer: Self-pay | Admitting: Neurology

## 2016-08-24 VITALS — BP 166/87 | HR 80 | Ht <= 58 in | Wt 139.8 lb

## 2016-08-24 DIAGNOSIS — I6523 Occlusion and stenosis of bilateral carotid arteries: Secondary | ICD-10-CM

## 2016-08-24 DIAGNOSIS — R42 Dizziness and giddiness: Secondary | ICD-10-CM | POA: Diagnosis not present

## 2016-08-24 NOTE — Patient Instructions (Signed)
As far as diagnostic testing: Continue to follow with [facility]  My clinical assistant and will answer any of your questions and relay your messages to me and also relay most of my messages to you.   Our phone number is [phone]. We also have an after hours call service for urgent matters and there is a physician on-call for urgent questions. For any emergencies you know to call 911 or go to the nearest emergency room

## 2016-08-24 NOTE — Progress Notes (Signed)
[facility]    Provider:  Dr Jaynee Eagles Referring Provider: Derinda Late, MD Primary Care Physician:  Marylene Land, MD  CC: dizziness  HPI overview: Patient with persistent dizziness. She has had extensive testing without etiology for her persistent dizziness. Symptoms started after an epidural steroid injection and CSF leak was in the differential for cause however CT myelogram did not show any csf leak. She has had extensive testing including imaging, vascular studies, ENT evaluation, cardiac evaluation.  She had been participating in physical/vestibular therapy for her dizziness which helped however her symptoms worsened after stopping. Physical/vestibular therapy helped for a while. Dizziness started back again after physical therapy completed. She feels like she has been in a fog for a long time. She feels dizzy even sititng, like a cloud has come over her head. She has to make herself do things. She doesn;t feel right. She sometimes feels very lightheaded like she is going to pass out, She feels like the "lights are going off" like she was going to faint during one occasion if she described it. We have done orthostatics as well which do not show orthostatic hypotension.   Interval history 08/24/2016: She is here for continuous dizziness and lightheadedness and fatigued for the last year. Even sitting here again she feels lightheaded and dizzy but no room spinning and no nausea. Symptoms are continuous all day. She still feels foggy. Not improving. I was very honest with patient,  I'm out of ideas for her and feel I don't have anything else to offer and I happy she is going to [facility], she is here with her husband. She was seen at [facility] and she goes again September 7th. We tried Depakote last time as a last resort that possibly that these are vestibular migraine but does not seem to correlate and Depakote did not improve symptoms. 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 She is being seen at [facility]. The only thing that has helped her dizziness was vestibular therapy. She is working with [facility], I think this is good as I do believe this is an inner-ear pathology as the exercises at Vestibular therapy did help. No neurologic etiology for her dizziness.   Interval History 05/20/2016: Reviewed with patient and family extensive testing. Today she reports that she is not improved. Blood pressure is elevated today systolic 917 and patient tells me they have tried decreasing her blood pressure medications to see if this helps. She is complaining of multiple other complaints today as well, here with daughter and husband. Does not sound classically like vestibular migraines or vestibular component of migraines however patient does have a history of migraines. With the current headache she denies light sensitivity or sound sensitivity. However we can try treating her for migraines and see what happens.   Interval history 01/07/2016  The dizziness persists. The headaches are better since the patch. Blood patch was on October the 9th. She still has the dizziness. She feels lightheaded continuously. Even when sitting. She had food poisoning after eating a burger. She was having pain and went to the ED and she was treated for Diverticulitis. She was given cipro and flagyl. She had a reaction to the Flagyl. She stopped the Doxazosin. Her orthostatics are normal. She has been to ENT. She has been extensively tested. She went to Cardiology. She feels unsteady with the lightheadedness. She uses a walker. We discussed possible CT myelogram at  length, but need to discuss with Dr. Teryl Lucy (interventional radiology) and Dr. Sandi Mariscal if CT Myelogram would be beneficial to evaluate for csf leak.  Initial Visit: [name] is a 80 y.o. female here as a referral from Dr. Sandi Mariscal for dizziness and presyncope. She has a past medical  history of lumbosacral degenerative disease, hyperlipidemia, hypertension, fibromyalgia, cervical radiculopathy, migraines, left brachial artery thrombosis, vasovagal syncope, right carpal tunnel syndrome, intermittent benign positional vertigo, type 2 diabetes. She had an epidural steroid injection on September 2nd. Since then having headaches and dizziness. Extensive workup by Dr. Sandi Mariscal has been negative to date. She had carotid artery studies, echo, MRI and now is going to the ENT. She describes it as being lightheaded. No room spinning. Bending over makes her dizzy. About a week after the epidural steroid injection is when it all started. She is on Lyrica for her back pain but she has been on it for 4-5 years, no new medications or other known inciting factors except for the epidural steroid injection. No symptoms when she stands up. Headaches are occasionally. Drinking coffee helps her headache go away. Headaches occur when standing or sitting, no headache when laying down. She sometimes wakes up from a deep sleep with a headache, she snores but she denies excessive daytime sleepiness and is very energetic. Headaches started in the last week. She feels like she is in a cloud, a terrible head cold floating around. She was walking from the living room to the kitchen and she was sitting and all of a sudden she felt like she was going to faint. Then she had the pins and needles in the left hand which have since resolved. The headaches are in the temples. No vision changes. No jaw claudication. Patient reports that she had similar symptoms after last epidural steroid injection in May 2016. At that time the symptoms lasted a few weeks. Denies dysarthria, dysphagia, aphasia, facial droop, new weakness, hearing changes, vision changes, neck stiffness, fever, chills or other systemic symptoms.  Reviewed notes, labs and imaging from outside physicians, which showed: Per Dr. Deboraha Sprang notes, patient had an epidural  injection for her lumbar radiculopathy on 09/12/2015. Since then she's had frequent symptoms of dizziness. She's had presyncopal episodes, when she was bending forward. She sometimes feels to be worse with rapid changes of position of her head. No true vertigo.  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 Multiple studies recently included a Holter report which showed rare PVCs and PACs, and echocardiogram showed mild-to-moderate aortic regurgitation was otherwise unremarkable, and MRI and MRA of the brain scan showed an old right subcortical small vessel CVA and mild atrophy, there was mild to moderate left P2 PCA stenosis. Carotid Doppler study showed stable 40-59% right ICA stenosis and less than 40% left ICA stenosis.  MRI of the brain 08/2015: Personally reviewed images and agree with following. : 1. No acute intracranial abnormality or mass. 2. No medium or large vessel occlusion or significant proximal stenosis. Mild-to-moderate distal left P2 PCA stenosis. Appears artifactual. 3. Unchanged, tiny bulge along the left supraclinoid ICA. Also few small foci of T2 hyperintensity are again seen in the cerebral white matter which are nonspecific, and not greater than expected for patient's age  She has an allergy to Plavix and she is currently on aspirin.  Review of Systems: Patient complains of symptoms per HPI as well as the following symptoms: Blurred vision, fatigue, palpitations, murmur,  feeling cold, incontinence, joint pain, aching muscles, sleepiness, memory loss, confusion, headache, numbness, weakness, dizziness, decreased energy. Pertinent negatives per HPI. All others negative.   Social History   Social History  . Marital status: Married    Spouse name: [name]   . Number of children: 3  . Years of education: 12   Occupational History  . Not on file.   Social History Main Topics  .  Smoking status: Former Smoker    Packs/day: 1.00    Years: 3.00    Types: Cigarettes    Quit date: 02/08/1979  . Smokeless tobacco: Never Used  . Alcohol use No  . Drug use: No  . Sexual activity: Not on file   Other Topics Concern  . Not on file   Social History Narrative   Lives at home with husband, [name].   Caffeine use: 2 cups coffee per day.    Family History  Problem Relation Age of Onset  . Dementia Mother   . Heart attack Father     Past Medical History:  Diagnosis Date  . Allergy   . Carotid artery occlusion   . Diabetes mellitus without complication ([place])   . History of shingles 11-2008  . Hyperlipidemia   . Hypertension   . Kidney stones 2012  . Osteoarthritis of cervical spine   . Stroke [facility]) 12-2009    Past Surgical History:  Procedure Laterality Date  . ABDOMINAL HYSTERECTOMY  1973  . ANKLE RECONSTRUCTION  1990   right - 5 screws & [name]  . CHOLECYSTECTOMY  1988  . FOOT FUSION  2005   left - 4 screws  . GALLBLADDER SURGERY  1988   large stone blockage  . [place]   left side  . [place]  . LUMBAR EPIDURAL INJECTION  May-Aug-Sept. 2015/ 08-29-15  . [place]   right  . spinal fusion surgery  2000   C4 and C5  . spinal fusion surgery  2003, 2010   C6 and C7  . SPINE SURGERY  2000   C4-C5 fusion  . SPINE SURGERY  2003   C6-C7 fusion  . SPINE SURGERY  2010   replace broken [name] (cervical region)  . STOMACH SURGERY  2002   mass rt & left (not cancerous)    Current Outpatient Prescriptions  Medication Sig Dispense Refill  . aspirin 81 MG tablet Take 81 mg by mouth daily. Per patient, takes one 81 mg daily    . calcium carbonate (OS-CAL) 600 MG TABS tablet Take 600 mg by mouth 2 (two) times daily with a meal.    . Evolocumab with Infusor 420 MG/3.5ML SOCT Inject 420 mg into the skin every 30 (thirty) days.     Marland Kitchen LORazepam (ATIVAN) 1 MG tablet Take 1 mg by mouth daily.    Marland Kitchen LYRICA 50 MG capsule  Take 1 tablet by mouth daily. Take 1 tab daily as needed for back pain    . meloxicam (MOBIC) 7.5 MG tablet Take 1 tablet by mouth daily as needed. Take 1 tab daily    . Multiple Vitamin (MULTIVITAMIN) tablet Take 1 tablet by mouth daily.    . Omega-3 Fatty Acids (FISH OIL PO) Take 1,400 mg by mouth daily.     Marland Kitchen omeprazole (PRILOSEC) 20 MG capsule Take 20 mg by mouth daily.    . polyethylene glycol (MIRALAX / GLYCOLAX) packet Take 17 g by mouth daily.    Marland Kitchen  vitamin B-12 (CYANOCOBALAMIN) 1000 MCG tablet Take 1,000 mcg by mouth daily.    . VOLTAREN 1 % GEL Apply 2 g topically daily as needed. For pain     No current facility-administered medications for this visit.     Allergies as of 08/24/2016 - Review Complete 08/24/2016  Allergen Reaction Noted  . Plavix [clopidogrel bisulfate] Other (See Comments) 02/09/2012  . Prinivil [lisinopril] Swelling 02/09/2012  . Sulfa antibiotics Hives 02/09/2012  . Sulfasalazine Hives 02/09/2012  . Metronidazole  01/07/2016    Vitals: BP (!) 166/87 (BP Location: Right Arm, Patient Position: Sitting, Cuff Size: Normal)   Pulse 80   Ht '4\' 10"'  (1.473 m)   Wt 139 lb 12.8 oz (63.4 kg)   BMI 29.22 kg/m  Last Weight:  Wt Readings from Last 1 Encounters:  08/24/16 139 lb 12.8 oz (63.4 kg)   Last Height:   Ht Readings from Last 1 Encounters:  08/24/16 '4\' 10"'  (1.473 m)   Cranial Nerves:  The pupils are equal, round, and reactive to light. Attempted fundoscopic exam, coul dnot visualize. Visual fields are full to finger confrontation. Extraocular movements are intact. Trigeminal sensation is intact and the muscles of mastication are normal. The face is symmetric. The palate elevates in the midline. Hearing intact. Voice is normal. Shoulder shrug is normal. The tongue has normal motion without fasciculations.   Coordination:  No dysmetria  Gait:: very mild shuffling, uses walker    Motor Observation:   No asymmetry, no atrophy, and no  involuntary movements noted. Tone:  Normal muscle tone.   Posture:  Posture is normal. normal erect   Strength:  Right proximal UE weakness(chronic). Bilateral hip flexion weakness otherwise strength is V/V in the upper and lower limbs.    Sensation: intact to LT   Reflex Exam:  DTR's:  Deep tendon reflexes in the upper and lower extremities are brisk for age and medical conditions bilaterally.  Toes:  The toes are equivocal bilaterally.  Clonus:  Clonus is absent.     Assessment/Plan: 80 year old female who presents with chronic persistent dizziness, feeling like she is in a fog, and possible migraine aura.  MRI of the brain and MRA of the head were unremarkable, she has been evaluated by ENT, she has had cardiac workup and vascular evaluation. She has a past medical history of lumbosacral degenerative disease, hyperlipidemia, hypertension, fibromyalgia, cervical radiculopathy, migraines(non ein years, now occ aura only), left brachial artery thrombosis, vasovagal syncope, right carpal tunnel syndrome, intermittent benign positional vertigo, type 2 diabetes.  Extensive workup has been negative to date MRI, MRA, carotid artery studies, echo, ekg, holter monitor. CT myelogram was negative for CSF leak. Vestibular therapy helped while she was in it but symptoms return. ESR and CRP were negative.  Does not sound classically like vestibular migraines or a vestibular component of migraines but patient does have a history of migraines and dizziness as a component of this disorder. Depakote did not help and today she denies any recent headaches in years.  Patient should continue to follow up with [facility] vestibular center. No neurologic etiology for her persistent symptoms. I do feel that the [facility] will help her especially since she improved during vestibular therapy here at time.  Cc: Dr. Sandi Mariscal  Addendum from dr blomgren:  b12 935, tsh 3.3 Julia Ill, MD  [facility] [address] [place], [place] [address]  Phone [phone] Fax [phone]  A total of 60 minutes was spent face-to-face  with this patient and her family. Over half this time was spent on counseling patient on the dizziness diagnosis and different diagnostic and therapeutic options available.

## 2016-09-22 ENCOUNTER — Encounter: Payer: Self-pay | Admitting: Family

## 2016-09-28 ENCOUNTER — Encounter: Payer: Self-pay | Admitting: Family

## 2016-09-28 ENCOUNTER — Ambulatory Visit (HOSPITAL_COMMUNITY)
Admission: RE | Admit: 2016-09-28 | Discharge: 2016-09-28 | Disposition: A | Discharge status: Home / Self Care | Payer: Medicare Other | Source: Ambulatory Visit | Attending: Vascular Surgery | Admitting: Vascular Surgery

## 2016-09-28 ENCOUNTER — Ambulatory Visit (INDEPENDENT_AMBULATORY_CARE_PROVIDER_SITE_OTHER): Payer: Medicare Other | Admitting: Family

## 2016-09-28 VITALS — BP 140/100 | HR 70 | Temp 98.7°F | Resp 16 | Ht <= 58 in | Wt 138.0 lb

## 2016-09-28 DIAGNOSIS — R42 Dizziness and giddiness: Secondary | ICD-10-CM

## 2016-09-28 DIAGNOSIS — I6523 Occlusion and stenosis of bilateral carotid arteries: Secondary | ICD-10-CM

## 2016-09-28 LAB — VAS US CAROTID
LCCADDIAS: 8 cm/s
LCCAPDIAS: 12 cm/s
LCCAPSYS: 58 cm/s
LEFT ECA DIAS: 6 cm/s
LEFT VERTEBRAL DIAS: 7 cm/s
LICADDIAS: -17 cm/s
LICADSYS: -75 cm/s
Left CCA dist sys: 53 cm/s
RCCAPSYS: 66 cm/s
RIGHT ECA DIAS: 4 cm/s
RIGHT VERTEBRAL DIAS: 6 cm/s
Right CCA prox dias: 8 cm/s
Right cca dist sys: -99 cm/s

## 2016-09-28 NOTE — Patient Instructions (Signed)
Stroke Prevention Some medical conditions and behaviors are associated with an increased chance of having a stroke. You may prevent a stroke by making healthy choices and managing medical conditions. HOW CAN I REDUCE MY RISK OF HAVING A STROKE?   Stay physically active. Get at least 30 minutes of activity on most or all days.  Do not smoke. It may also be helpful to avoid exposure to secondhand smoke.  Limit alcohol use. Moderate alcohol use is considered to be:  No more than 2 drinks per day for men.  No more than 1 drink per day for nonpregnant women.  Eat healthy foods. This involves:  Eating 5 or more servings of fruits and vegetables a day.  Making dietary changes that address high blood pressure (hypertension), high cholesterol, diabetes, or obesity.  Manage your cholesterol levels.  Making food choices that are high in fiber and low in saturated fat, trans fat, and cholesterol may control cholesterol levels.  Take any prescribed medicines to control cholesterol as directed by your health care provider.  Manage your diabetes.  Controlling your carbohydrate and sugar intake is recommended to manage diabetes.  Take any prescribed medicines to control diabetes as directed by your health care provider.  Control your hypertension.  Making food choices that are low in salt (sodium), saturated fat, trans fat, and cholesterol is recommended to manage hypertension.  Ask your health care provider if you need treatment to lower your blood pressure. Take any prescribed medicines to control hypertension as directed by your health care provider.  If you are 18-39 years of age, have your blood pressure checked every 3-5 years. If you are 40 years of age or older, have your blood pressure checked every year.  Maintain a healthy weight.  Reducing calorie intake and making food choices that are low in sodium, saturated fat, trans fat, and cholesterol are recommended to manage  weight.  Stop drug abuse.  Avoid taking birth control pills.  Talk to your health care provider about the risks of taking birth control pills if you are over 35 years old, smoke, get migraines, or have ever had a blood clot.  Get evaluated for sleep disorders (sleep apnea).  Talk to your health care provider about getting a sleep evaluation if you snore a lot or have excessive sleepiness.  Take medicines only as directed by your health care provider.  For some people, aspirin or blood thinners (anticoagulants) are helpful in reducing the risk of forming abnormal blood clots that can lead to stroke. If you have the irregular heart rhythm of atrial fibrillation, you should be on a blood thinner unless there is a good reason you cannot take them.  Understand all your medicine instructions.  Make sure that other conditions (such as anemia or atherosclerosis) are addressed. SEEK IMMEDIATE MEDICAL CARE IF:   You have sudden weakness or numbness of the face, arm, or leg, especially on one side of the body.  Your face or eyelid droops to one side.  You have sudden confusion.  You have trouble speaking (aphasia) or understanding.  You have sudden trouble seeing in one or both eyes.  You have sudden trouble walking.  You have dizziness.  You have a loss of balance or coordination.  You have a sudden, severe headache with no known cause.  You have new chest pain or an irregular heartbeat. Any of these symptoms may represent a serious problem that is an emergency. Do not wait to see if the symptoms will   go away. Get medical help at once. Call your local emergency services (911 in [place] Do not drive yourself to the hospital.   This information is not intended to replace advice given to you by your health care provider. Make sure you discuss any questions you have with your health care provider.   Document Released: 01/20/2005 Document Revised: 01/03/2015 Document Reviewed:  06/15/2013 Elsevier Interactive Patient Education 2016 [employer]  

## 2016-09-28 NOTE — Progress Notes (Signed)
Chief Complaint: Follow up Extracranial Carotid Artery Stenosis   History of Present Illness  [name] is a 80 y.o. female  patient of Dr. Kellie Simmering who has known carotid artery stenosis. She had severe back problems in 2011 which required outpatient vancomycin because of infection. She has had multiple operations on her lumbar and cervical spine and continues to have some discomfort in the left leg related to this. Medical record indicates that she had a right subcortical small vessel CVA in 2011.  She recalls having a stroke in 2011 as manifested by bilateral legs weakness; during that hospitalization she also had severe shingles on the left side of her body and a DVT. The patient denies any subsequent stroke or TIA symptoms.  She reports bilateral diplopia in 2011 but had exercises to improve this.  She denies unilateral facial drooping.  She reports temporary expressive aphasia which has resolved.  She denies any known cardiac problems, but states that her cholesterol was very high, she is taking Repatha for this.   She had ESI's in May and September 2016.  Pt yet c/o dizziness which started a week after the September 2nd 2016 ESI. Pt states she received a great deal of relief from bilateral hips pain after the ESI. She does not feel dizzy when lying down, she does feel dizzy with walking and sitting. Dizziness is worsening.  She had an episode of tingling in left arm in 2016 that lasted about 15 minutes; she denies monocular loss of vision at that time, denies speech difficulties at that time. Pt saw Dr. Sandi Mariscal re these symptoms who advised pt to be evaluated here.   She had 3  c-spine surgeries, last one in 2010.  She reports right rotator cuff issues.  Pt states Dr. Sandi Mariscal referred her for an ENT evaluation which she had at [facility]; pt states no etiology for her dizziness was found.  She states she will be wearing a cardiac monitor for a month. She had an MRI of  her head requested by Dr. Jaynee Eagles, pt states this was normal. She also had physical therapy which improved her walking; is walking independently with mild weakness in left leg and post-herpetic pain in left leg.    Pt Diabetic: Yes, diet controlled.  Pt smoker: non-smoker   Pt meds include:  Statin : No, caused severe myalgias ASA: Yes  Other anticoagulants/antiplatelets: no    Past Medical History:  Diagnosis Date  . Allergy   . Carotid artery occlusion   . Diabetes mellitus without complication ([place])   . History of shingles 11-2008  . Hyperlipidemia   . Hypertension   . Kidney stones 2012  . Osteoarthritis of cervical spine   . Stroke [facility]) 12-2009    Social History Social History  Substance Use Topics  . Smoking status: Former Smoker    Packs/day: 1.00    Years: 3.00    Types: Cigarettes    Quit date: 02/08/1979  . Smokeless tobacco: Never Used  . Alcohol use No    Family History Family History  Problem Relation Age of Onset  . Dementia Mother   . Heart attack Father     Surgical History Past Surgical History:  Procedure Laterality Date  . ABDOMINAL HYSTERECTOMY  1973  . ANKLE RECONSTRUCTION  1990   right - 5 screws & plate  . CHOLECYSTECTOMY  1988  . FOOT FUSION  2005   left - 4 screws  . [place]  large stone blockage  . [place]   left side  . [place]  . LUMBAR EPIDURAL INJECTION  May-Aug-Sept. 2015/ 08-29-15  . [place]   right  . spinal fusion surgery  2000   C4 and C5  . spinal fusion surgery  2003, 2010   C6 and C7  . SPINE SURGERY  2000   C4-C5 fusion  . SPINE SURGERY  2003   C6-C7 fusion  . SPINE SURGERY  2010   replace broken plate (cervical region)  . STOMACH SURGERY  2002   mass rt & left (not cancerous)    Allergies  Allergen Reactions  . Plavix [Clopidogrel Bisulfate] Other (See Comments)    Shakes and teeth chattering, "just didn't feel right"  . Prinivil  [Lisinopril] Swelling    Lip swelling Lip swelling  . Sulfa Antibiotics Hives  . Sulfasalazine Hives  . Metronidazole     She got very sick    Current Outpatient Prescriptions  Medication Sig Dispense Refill  . aspirin 81 MG tablet Take 81 mg by mouth daily. Per patient, takes one 81 mg daily    . calcium carbonate (OS-CAL) 600 MG TABS tablet Take 600 mg by mouth 2 (two) times daily with a meal.    . Evolocumab with Infusor 420 MG/3.5ML SOCT Inject 420 mg into the skin every 30 (thirty) days.     . furosemide (LASIX) 20 MG tablet Take 20 mg by mouth.    Marland Kitchen LORazepam (ATIVAN) 1 MG tablet Take 1 mg by mouth daily.    Marland Kitchen losartan (COZAAR) 100 MG tablet Take 100 mg by mouth daily.    Marland Kitchen LYRICA 50 MG capsule Take 1 tablet by mouth daily. Take 1 tab daily as needed for back pain    . meloxicam (MOBIC) 7.5 MG tablet Take 1 tablet by mouth daily as needed. Take 1 tab daily    . Multiple Vitamin (MULTIVITAMIN) tablet Take 1 tablet by mouth daily.    . Omega-3 Fatty Acids (FISH OIL PO) Take 1,400 mg by mouth daily.     Marland Kitchen omeprazole (PRILOSEC) 20 MG capsule Take 20 mg by mouth daily.    . polyethylene glycol (MIRALAX / GLYCOLAX) packet Take 17 g by mouth daily.    . vitamin B-12 (CYANOCOBALAMIN) 1000 MCG tablet Take 1,000 mcg by mouth daily.    . VOLTAREN 1 % GEL Apply 2 g topically daily as needed. For pain     No current facility-administered medications for this visit.     Review of Systems : See HPI for pertinent positives and negatives.  Physical Examination  Vitals:   09/28/16 1422 09/28/16 1424  BP: 140/90 (!) 140/100  Pulse: 70   Resp: 16   Temp: 98.7 F (37.1 C)   SpO2: 97%   Weight: 138 lb (62.6 kg)   Height: 4\' 10"  (1.473 m)    Body mass index is 28.84 kg/m.  General: WDWN female in NAD  GAIT: normal  Eyes: PERRLA  Pulmonary: Respirations are non labored, CTAB, no rales, rhonchi, or wheezing.  Cardiac: regular rhythm and rate, no detected murmur.   VASCULAR EXAM   Carotid Bruits  Left  Right    Negative  Negative    Aorta is not palpable.  Radial pulses are 2+ palpable and equal.   LE Pulses  LEFT  RIGHT   POPLITEAL  not palpable  not palpable   POSTERIOR TIBIAL  not palpable  not palpable   DORSALIS PEDIS  ANTERIOR TIBIAL  Faintly palpable  Faintly palpable    Gastrointestinal: soft, nontender, BS WNL, no r/g, no palpated masses.  Musculoskeletal: No muscle atrophy/wasting. M/S 5/5 throughout except 4/5 in right UE, Extremities without ischemic changes.  Neurologic: A&O X 3; Appropriate Affect ; SENSATION ;normal;  Speech is normal  CN 2-12 intact except, Pain and light touch intact in extremities, Motor exam as listed above.    Assessment: [name] is a 80 y.o. female who has a history of stroke in 2011, none subsequently.  Medical record indicates that she had a right subcortical small vessel CVA in 2011.  She returns a month earlier than her scheduled yearly return due to dizziness that started about a week after a September 2nd epidural steroid injection for low back and bilateral hip pain; this afforded her a great deal of relief. Her PCP requested an evaluation by [place] re her carotid arteries. Pt states she is not dizzy supine, but is dizzy with sitting and standing, states the dizziness is worsening. She also has known c-spine issues having had several c-spine procedures.   At her 09/23/15 office visit, I discussed with Dr. Kellie Simmering pt's symptoms and carotid duplex results from that day. Carotid duplex results at that time were unchanged from almost a year previous to that, her symptoms started a couple of weeks prior to that visit, about a week after an ESI. Nothing on the carotid duplex results that day required intervention and does not lend any connection to pt's symptoms.   DATA Today's carotid duplex suggests <40% bilateral internal carotid artery stenosis. Both vertebral arteries are antegrade.   Decreased velocity in the right ICA and stable in the left ICA compared to duplex exam on 09/23/15.    Plan: Follow-up in 1 year with Carotid Duplex scan.   I discussed in depth with the patient the nature of atherosclerosis, and emphasized the importance of maximal medical management including strict control of blood pressure, blood glucose, and lipid levels, obtaining regular exercise, and continued cessation of smoking.  The patient is aware that without maximal medical management the underlying atherosclerotic disease process will progress, limiting the benefit of any interventions. The patient was given information about stroke prevention and what symptoms should prompt the patient to seek immediate medical care. Thank you for allowing [place] to participate in this patient's care.  Clemon Chambers, RN, MSN, FNP-C [facility] Office: [phone]  Clinic Physician: Scot Dock on call  09/28/16 3:02 PM

## 2016-10-01 NOTE — Addendum Note (Signed)
Addended by: Kaleen Mask on: 10/01/2016 05:00 PM   Modules accepted: Orders

## 2016-10-11 ENCOUNTER — Ambulatory Visit (INDEPENDENT_AMBULATORY_CARE_PROVIDER_SITE_OTHER): Payer: Medicare Other

## 2016-10-11 DIAGNOSIS — R42 Dizziness and giddiness: Secondary | ICD-10-CM | POA: Diagnosis not present

## 2017-01-07 ENCOUNTER — Other Ambulatory Visit (HOSPITAL_BASED_OUTPATIENT_CLINIC_OR_DEPARTMENT_OTHER): Payer: Self-pay

## 2017-01-07 DIAGNOSIS — R42 Dizziness and giddiness: Secondary | ICD-10-CM

## 2017-01-07 DIAGNOSIS — R5383 Other fatigue: Secondary | ICD-10-CM

## 2017-02-14 ENCOUNTER — Ambulatory Visit (HOSPITAL_BASED_OUTPATIENT_CLINIC_OR_DEPARTMENT_OTHER): Payer: Medicare Other | Attending: Family Medicine | Admitting: Internal Medicine

## 2017-02-14 VITALS — Ht <= 58 in | Wt 140.0 lb

## 2017-02-14 DIAGNOSIS — G4733 Obstructive sleep apnea (adult) (pediatric): Secondary | ICD-10-CM | POA: Insufficient documentation

## 2017-02-14 DIAGNOSIS — R42 Dizziness and giddiness: Secondary | ICD-10-CM | POA: Insufficient documentation

## 2017-02-14 DIAGNOSIS — R5383 Other fatigue: Secondary | ICD-10-CM

## 2017-02-19 DIAGNOSIS — G4733 Obstructive sleep apnea (adult) (pediatric): Secondary | ICD-10-CM | POA: Diagnosis not present

## 2017-02-19 NOTE — Procedures (Signed)
  Patient Name: [name], Huitt Date: 02/14/2017 Gender: Female D.O.B: Apr 06, 1936 Age (years): 80 Referring Provider: Derinda Late Height (inches): 58 Interpreting Physician: Baird Lyons MD, ABSM Weight (lbs): 140 RPSGT: Jonna Coup BMI: 29 MRN: [id number] Neck Size: 14.00 CLINICAL INFORMATION Sleep Study Type: NPSG  Indication for sleep study: Fatigue   Epworth Sleepiness Score: 0   SLEEP STUDY TECHNIQUE As per the AASM Manual for the Scoring of Sleep and Associated Events v2.3 (April 2016) with a hypopnea requiring 4% desaturations.  The channels recorded and monitored were frontal, central and occipital EEG, electrooculogram (EOG), submentalis EMG (chin), nasal and oral airflow, thoracic and abdominal wall motion, anterior tibialis EMG, snore microphone, electrocardiogram, and pulse oximetry.  MEDICATIONS Medications self-administered by patient taken the night of the study : none reported  SLEEP ARCHITECTURE The study was initiated at 10:31:51 PM and ended at 4:32:38 AM.  Sleep onset time was 6.1 minutes and the sleep efficiency was 70.2%. The total sleep time was 253.2 minutes.  Stage REM latency was 149.5 minutes.  The patient spent 7.11% of the night in stage N1 sleep, 83.41% in stage N2 sleep, 0.00% in stage N3 and 9.48% in REM.  Alpha intrusion was absent.  Supine sleep was 15.31%.  RESPIRATORY PARAMETERS The overall apnea/hypopnea index (AHI) was 7.1 per hour. There were 3 total apneas, including 2 obstructive, 1 central and 0 mixed apneas. There were 27 hypopneas and 5 RERAs.  The AHI during Stage REM sleep was 12.5 per hour.  AHI while supine was 0.0 per hour.  The mean oxygen saturation was 92.37%. The minimum SpO2 during sleep was 84.00%.  Moderate snoring was noted during this study.  CARDIAC DATA The 2 lead EKG demonstrated sinus rhythm. The mean heart rate was 68.93 beats per minute. Other EKG findings include: PVCs.  LEG  MOVEMENT DATA The total PLMS were 64 with a resulting PLMS index of 15.16. Associated arousal with leg movement index was 3.6 .  IMPRESSIONS - Mild obstructive sleep apnea occurred during this study (AHI = 7.1/h). - Insufficient early events to meet protocol requirements for split CPAP titration. - No significant central sleep apnea occurred during this study (CAI = 0.2/h). - Mild oxygen desaturation was noted during this study (Min O2 = 84.00%). - The patient snored with Moderate snoring volume. - EKG findings include PVCs. - Mild periodic limb movements of sleep occurred during the study. No significant associated arousals.  DIAGNOSIS - Obstructive Sleep Apnea (327.23 [G47.33 ICD-10]  RECOMMENDATIONS - Very mild obstructive sleep apnea. Return to Dr Sandi Mariscal to discuss treatment options. Common options might include CPAP, chin strap, fitted oral appliance, trial of weight loss. - Avoid alcohol, sedatives and other CNS depressants that may worsen sleep apnea and disrupt normal sleep architecture. - Sleep hygiene should be reviewed to assess factors that may improve sleep quality. - Weight management and regular exercise should be initiated or continued if appropriate.  [Electronically signed] 02/19/2017 07:08 PM  Baird Lyons MD, [place], American Board of Sleep Medicine   NPI: [id number]  [place], American Board of Sleep Medicine  ELECTRONICALLY SIGNED ON:  02/19/2017, 7:04 PM [place] PH: [phone]   FX: [phone] [place]

## 2017-10-05 ENCOUNTER — Ambulatory Visit (HOSPITAL_COMMUNITY)
Admission: RE | Admit: 2017-10-05 | Discharge: 2017-10-05 | Disposition: A | Discharge status: Home / Self Care | Payer: Medicare Other | Source: Ambulatory Visit | Attending: Family | Admitting: Family

## 2017-10-05 ENCOUNTER — Encounter: Payer: Self-pay | Admitting: Family

## 2017-10-05 ENCOUNTER — Ambulatory Visit (INDEPENDENT_AMBULATORY_CARE_PROVIDER_SITE_OTHER): Payer: Medicare Other | Admitting: Family

## 2017-10-05 VITALS — BP 182/87 | HR 88 | Temp 97.7°F | Resp 18 | Ht <= 58 in | Wt 137.0 lb

## 2017-10-05 DIAGNOSIS — R42 Dizziness and giddiness: Secondary | ICD-10-CM

## 2017-10-05 DIAGNOSIS — I6523 Occlusion and stenosis of bilateral carotid arteries: Secondary | ICD-10-CM | POA: Insufficient documentation

## 2017-10-05 LAB — VAS US CAROTID
LCCADDIAS: 14 cm/s
LCCADSYS: 71 cm/s
LCCAPSYS: 67 cm/s
LEFT ECA DIAS: 4 cm/s
LEFT VERTEBRAL DIAS: 13 cm/s
LICADDIAS: -17 cm/s
LICADSYS: -70 cm/s
LICAPSYS: 79 cm/s
Left CCA prox dias: 11 cm/s
Left ICA prox dias: 22 cm/s
RCCADSYS: -105 cm/s
RIGHT CCA MID DIAS: 17 cm/s
RIGHT ECA DIAS: 8 cm/s
RIGHT VERTEBRAL DIAS: -7 cm/s
Right CCA prox dias: 9 cm/s
Right CCA prox sys: 76 cm/s

## 2017-10-05 NOTE — Patient Instructions (Signed)
Stroke Prevention Some medical conditions and behaviors are associated with an increased chance of having a stroke. You may prevent a stroke by making healthy choices and managing medical conditions. How can I reduce my risk of having a stroke?  Stay physically active. Get at least 30 minutes of activity on most or all days.  Do not smoke. It may also be helpful to avoid exposure to secondhand smoke.  Limit alcohol use. Moderate alcohol use is considered to be: ? No more than 2 drinks per day for men. ? No more than 1 drink per day for nonpregnant women.  Eat healthy foods. This involves: ? Eating 5 or more servings of fruits and vegetables a day. ? Making dietary changes that address high blood pressure (hypertension), high cholesterol, diabetes, or obesity.  Manage your cholesterol levels. ? Making food choices that are high in fiber and low in saturated fat, trans fat, and cholesterol may control cholesterol levels. ? Take any prescribed medicines to control cholesterol as directed by your health care provider.  Manage your diabetes. ? Controlling your carbohydrate and sugar intake is recommended to manage diabetes. ? Take any prescribed medicines to control diabetes as directed by your health care provider.  Control your hypertension. ? Making food choices that are low in salt (sodium), saturated fat, trans fat, and cholesterol is recommended to manage hypertension. ? Ask your health care provider if you need treatment to lower your blood pressure. Take any prescribed medicines to control hypertension as directed by your health care provider. ? If you are 18-39 years of age, have your blood pressure checked every 3-5 years. If you are 40 years of age or older, have your blood pressure checked every year.  Maintain a healthy weight. ? Reducing calorie intake and making food choices that are low in sodium, saturated fat, trans fat, and cholesterol are recommended to manage  weight.  Stop drug abuse.  Avoid taking birth control pills. ? Talk to your health care provider about the risks of taking birth control pills if you are over 35 years old, smoke, get migraines, or have ever had a blood clot.  Get evaluated for sleep disorders (sleep apnea). ? Talk to your health care provider about getting a sleep evaluation if you snore a lot or have excessive sleepiness.  Take medicines only as directed by your health care provider. ? For some people, aspirin or blood thinners (anticoagulants) are helpful in reducing the risk of forming abnormal blood clots that can lead to stroke. If you have the irregular heart rhythm of atrial fibrillation, you should be on a blood thinner unless there is a good reason you cannot take them. ? Understand all your medicine instructions.  Make sure that other conditions (such as anemia or atherosclerosis) are addressed. Get help right away if:  You have sudden weakness or numbness of the face, arm, or leg, especially on one side of the body.  Your face or eyelid droops to one side.  You have sudden confusion.  You have trouble speaking (aphasia) or understanding.  You have sudden trouble seeing in one or both eyes.  You have sudden trouble walking.  You have dizziness.  You have a loss of balance or coordination.  You have a sudden, severe headache with no known cause.  You have new chest pain or an irregular heartbeat. Any of these symptoms may represent a serious problem that is an emergency. Do not wait to see if the symptoms will go away.   Get medical help at once. Call your local emergency services (911 in [place] Do not drive yourself to the hospital. This information is not intended to replace advice given to you by your health care provider. Make sure you discuss any questions you have with your health care provider. Document Released: 01/20/2005 Document Revised: 05/20/2016 Document Reviewed: 06/15/2013 Elsevier  Interactive Patient Education  2017 Elsevier Inc.     Preventing Cerebrovascular Disease Arteries are blood vessels that carry blood that contains oxygen from the heart to all parts of the body. Cerebrovascular disease affects arteries that supply the brain. Any condition that blocks or disrupts blood flow to the brain can cause cerebrovascular disease. Brain cells that lose blood supply start to die within minutes (stroke). Stroke is the main danger of cerebrovascular disease. Atherosclerosis and high blood pressure are common causes of cerebrovascular disease. Atherosclerosis is narrowing and hardening of an artery that results when fat, cholesterol, calcium, or other substances (plaque) build up inside an artery. Plaque reduces blood flow through the artery. High blood pressure increases the risk of bleeding inside the brain. Making diet and lifestyle changes to prevent atherosclerosis and high blood pressure lowers your risk of cerebrovascular disease. What nutrition changes can be made?  Eat more fruits, vegetables, and whole grains.  Reduce how much saturated fat you eat. To do this, eat less red meat and fewer full-fat dairy products.  Eat healthy proteins instead of red meat. Healthy proteins include: ? Fish. Eat fish that contains heart-healthy omega-3 fatty acids, twice a week. Examples include salmon, albacore tuna, mackerel, and herring. ? Chicken. ? Nuts. ? Low-fat or nonfat yogurt.  Avoid processed meats, like bacon and lunchmeat.  Avoid foods that contain: ? A lot of sugar, such as sweets and drinks with added sugar. ? A lot of salt (sodium). Avoid adding extra salt to your food, as told by your health care provider. ? Trans fats, such as margarine and baked goods. Trans fats may be listed as "partially hydrogenated oils" on food labels.  Check food labels to see how much sodium, sugar, and trans fats are in foods.  Use vegetable oils that contain low amounts of  saturated fat, such as olive oil or canola oil. What lifestyle changes can be made?  Drink alcohol in moderation. This means no more than 1 drink a day for nonpregnant women and 2 drinks a day for men. One drink equals 12 oz of beer, 5 oz of wine, or 1 oz of hard liquor.  If you are overweight, ask your health care provider to recommend a weight-loss plan for you. Losing 5-10 lb (2.2-4.5 kg) can reduce your risk of diabetes, atherosclerosis, and high blood pressure.  Exercise for 30?60 minutes on most days, or as much as told by your health care provider. ? Do moderate-intensity exercise, such as brisk walking, bicycling, and water aerobics. Ask your health care provider which activities are safe for you.  Do not use any products that contain nicotine or tobacco, such as cigarettes and e-cigarettes. If you need help quitting, ask your health care provider. Why are these changes important? Making these changes lowers your risk of many diseases that can cause cerebrovascular disease and stroke. Stroke is a leading cause of death and disability. Making these changes also improves your overall health and quality of life. What can I do to lower my risk? The following factors make you more likely to develop cerebrovascular disease:  Being overweight.  Smoking.  Being physically inactive.    Eating a high-fat diet.  Having certain health conditions, such as: ? Diabetes. ? High blood pressure. ? Heart disease. ? Atherosclerosis. ? High cholesterol. ? Sickle cell disease.  Talk with your health care provider about your risk for cerebrovascular disease. Work with your health care provider to control diseases that you have that may contribute to cerebrovascular disease. Your health care provider may prescribe medicines to help prevent major causes of cerebrovascular disease. Where to find more information: Learn more about preventing cerebrovascular disease from:  National Heart, Lung, and  Blood Institute: www.nhlbi.nih.gov/health/health-topics/topics/stroke  Centers for Disease Control and Prevention: cdc.gov/stroke/about.htm  Summary  Cerebrovascular disease can lead to a stroke.  Atherosclerosis and high blood pressure are major causes of cerebrovascular disease.  Making diet and lifestyle changes can reduce your risk of cerebrovascular disease.  Work with your health care provider to get your risk factors under control to reduce your risk of cerebrovascular disease. This information is not intended to replace advice given to you by your health care provider. Make sure you discuss any questions you have with your health care provider. Document Released: 12/28/2015 Document Revised: 07/02/2016 Document Reviewed: 12/28/2015 Elsevier Interactive Patient Education  2018 Elsevier Inc.  

## 2017-10-05 NOTE — Progress Notes (Signed)
Chief Complaint: Follow up Extracranial Carotid Artery Stenosis   History of Present Illness  [name] is a 81 y.o. female patient of Dr. Kellie Simmering who has known carotid artery stenosis. She had severe back problems in 2011 which required outpatient vancomycin because of infection. She has had multiple operations on her lumbar and cervical spine and continues to have some discomfort in the left leg related to this. Medical record indicates that she had a right subcortical small vessel CVA in 2011.  She recalls having a stroke in 2011 as manifested by bilateral legs weakness; during that hospitalization she also had severe shingles on the left side of her body and a DVT. The patient denies any subsequent stroke or TIA symptoms.  She reports bilateral diplopia in 2011 but had exercises to improve this.  She denies unilateral facial drooping.  She reports temporary expressive aphasia which has resolved.  She denies any known cardiac problems, but states that her cholesterol was very high, she is taking Repatha for this.   She had ESI's in May and September 2016.  Pt yet c/o dizziness which started a week after the September 2nd 2016 ESI. Pt states she received a great deal of relief from bilateral hips pain after the ESI. She does not feel dizzy when lying down, she does feel dizzy with walking and sitting. Dizziness is worsening. She had an episode of tingling in left arm in 2016 that lasted about 15 minutes; she denies monocular loss of vision at that time, denies speech difficulties at that time. Pt saw Dr. Sandi Mariscal re these symptoms who advised pt to be evaluated here.   She had 3 c-spine surgeries, last one in 2010.  She reports right rotator cuff issues.  Pt states Dr. Sandi Mariscal referred her for an ENT evaluation which she had at [facility]; pt states no etiology for her dizziness was found.  She states she wore a cardiac monitor for a month. She had an MRI of her head  requested by Dr. Jaynee Eagles, pt states this was normal. She also had physical therapy which improved her walking; is walking independently with mild weakness in left leg and post-herpetic pain in left leg.   Pt states that her PCP has regularly requested and pt had what sounds like ABI's.   She is seeing a neuro ophthalmologist at [facility], apparently some vacsular narrowing in her brain may be the cause of her dizziness.    Pt Diabetic: Yes, diet controlled. Pt smoker: non-smoker  Pt meds include:  Statin : No, caused severe myalgias ASA: Yes  Other anticoagulants/antiplatelets: no    Past Medical History:  Diagnosis Date  . Allergy   . Carotid artery occlusion   . Diabetes mellitus without complication ([place])   . History of shingles 11-2008  . Hyperlipidemia   . Hypertension   . Kidney stones 2012  . Osteoarthritis of cervical spine   . Stroke [facility]) 12-2009    Social History Social History  Substance Use Topics  . Smoking status: Former Smoker    Packs/day: 1.00    Years: 3.00    Types: Cigarettes    Quit date: 02/08/1979  . Smokeless tobacco: Never Used  . Alcohol use No    Family History Family History  Problem Relation Age of Onset  . Dementia Mother   . Heart attack Father     Surgical History Past Surgical History:  Procedure Laterality Date  . ABDOMINAL HYSTERECTOMY  1973  . ANKLE  RECONSTRUCTION  1990   right - 5 screws & plate  . CHOLECYSTECTOMY  1988  . FOOT FUSION  2005   left - 4 screws  . GALLBLADDER SURGERY  1988   large stone blockage  . [place]   left side  . [place]  . LUMBAR EPIDURAL INJECTION  May-Aug-Sept. 2015/ 08-29-15  . [place]   right  . spinal fusion surgery  2000   C4 and C5  . spinal fusion surgery  2003, 2010   C6 and C7  . SPINE SURGERY  2000   C4-C5 fusion  . SPINE SURGERY  2003   C6-C7 fusion  . SPINE SURGERY  2010   replace broken plate (cervical region)   . STOMACH SURGERY  2002   mass rt & left (not cancerous)    Allergies  Allergen Reactions  . Plavix [Clopidogrel Bisulfate] Other (See Comments)    Shakes and teeth chattering, "just didn't feel right"  . Prinivil [Lisinopril] Swelling    Lip swelling Lip swelling  . Sulfa Antibiotics Hives  . Sulfasalazine Hives  . Metronidazole     She got very sick    Current Outpatient Prescriptions  Medication Sig Dispense Refill  . aspirin 81 MG tablet Take 81 mg by mouth daily. Per patient, takes one 81 mg daily    . calcium carbonate (OS-CAL) 600 MG TABS tablet Take 600 mg by mouth 2 (two) times daily with a meal.    . Evolocumab with Infusor 420 MG/3.5ML SOCT Inject 420 mg into the skin every 30 (thirty) days.     Marland Kitchen LORazepam (ATIVAN) 1 MG tablet Take 1 mg by mouth daily.    Marland Kitchen losartan (COZAAR) 100 MG tablet Take 100 mg by mouth daily.    . meloxicam (MOBIC) 7.5 MG tablet Take 1 tablet by mouth daily as needed. Take 1 tab daily    . Multiple Vitamin (MULTIVITAMIN) tablet Take 1 tablet by mouth daily.    . Omega-3 Fatty Acids (FISH OIL PO) Take 1,400 mg by mouth daily.     Marland Kitchen omeprazole (PRILOSEC) 20 MG capsule Take 20 mg by mouth daily.    . polyethylene glycol (MIRALAX / GLYCOLAX) packet Take 17 g by mouth daily.    . vitamin B-12 (CYANOCOBALAMIN) 1000 MCG tablet Take 1,000 mcg by mouth daily.    . VOLTAREN 1 % GEL Apply 2 g topically daily as needed. For pain    . furosemide (LASIX) 20 MG tablet Take 20 mg by mouth.    Marland Kitchen LYRICA 50 MG capsule Take 1 tablet by mouth daily. Take 1 tab daily as needed for back pain     No current facility-administered medications for this visit.     Review of Systems : See HPI for pertinent positives and negatives.  Physical Examination  Vitals:   10/05/17 1354 10/05/17 1358  BP: (!) 177/84 (!) 182/87  Pulse: 88   Resp: 18   Temp: 97.7 F (36.5 C)   TempSrc: Oral   SpO2: 98%   Weight: 137 lb (62.1 kg)   Height: 4\' 10"  (1.473 m)    Body  mass index is 28.63 kg/m.  General: WDWN female in NAD  GAIT:normal  Eyes: PERRLA  Pulmonary: Respirations are non labored, CTAB, no rales, rhonchi, or wheezing.  Cardiac: regular rhythm and rate, no detected murmur.   VASCULAR EXAM Carotid Bruits Left Right   Negative  Negative  Abdominal aortic pulse is not palpable.  Radial pulses are 2+ palpable and equal.   LE Pulses  LEFT  RIGHT   POPLITEAL  not palpable  not palpable  POSTERIOR TIBIAL  not palpable  not palpable   DORSALIS PEDIS ANTERIOR TIBIAL  Faintly palpable  Faintly palpable    Gastrointestinal: soft, nontender, BS WNL, no r/g, no palpated masses.  Musculoskeletal: No muscle atrophy/wasting. M/S 5/5 throughout except 4/5 in right UE, Extremities without ischemic changes.  Neurologic: A&O X 3; appropriate affect; SENSATION; normal; speech is normal, CN 2-12 intact except, Pain and light touch intact in extremities, Motor exam as listed above.     Assessment: [name] is a 81 y.o. female who has a history of stroke in 2011, none subsequently. Medical record indicates that she had a right subcortical small vessel CVA in 2011.   Her dizziness is under evaluation by ENT and neuro ophthalmologist.   I advised pt to check her blood pressure at home after she settles down, if yet higher than 150/90, she should notify her PCP. She denies chest pain or dyspnea, denies headache, still has the same funny feeling in her head that she has had for the last 2 years.    DATA Carotid Duplex (10/05/17): Right ICA: 40-59% stenosis. Left ICA: <40% stenosis. Bilateral vertebral artery flow is antegrade.  Bilateral subclavian artery waveforms are normal.  Increased velocity in the right ICA compared to the last exam on 09-28-16, but stable compared to the exam on 09-23-15.      Plan: Follow-up in 1 year with Carotid Duplex scan.    I discussed in depth with the patient the  nature of atherosclerosis, and emphasized the importance of maximal medical management including strict control of blood pressure, blood glucose, and lipid levels, obtaining regular exercise, and continued cessation of smoking.  The patient is aware that without maximal medical management the underlying atherosclerotic disease process will progress, limiting the benefit of any interventions. The patient was given information about stroke prevention and what symptoms should prompt the patient to seek immediate medical care. Thank you for allowing [place] to participate in this patient's care.   Clemon Chambers, RN, MSN, FNP-C [facility] Office: [phone]  Clinic Physician: Dickson/Fields  10/05/17 2:08 PM

## 2017-10-27 ENCOUNTER — Other Ambulatory Visit: Payer: Self-pay | Admitting: Family Medicine

## 2017-10-27 ENCOUNTER — Ambulatory Visit
Admission: RE | Admit: 2017-10-27 | Discharge: 2017-10-27 | Disposition: A | Discharge status: Home / Self Care | Payer: Medicare Other | Source: Ambulatory Visit | Attending: Family Medicine | Admitting: Family Medicine

## 2017-10-27 DIAGNOSIS — R0602 Shortness of breath: Secondary | ICD-10-CM

## 2017-11-23 NOTE — Addendum Note (Signed)
Addended by: Lianne Cure A on: 11/23/2017 03:49 PM   Modules accepted: Orders

## 2018-02-08 ENCOUNTER — Other Ambulatory Visit: Payer: Self-pay | Admitting: Pharmacist

## 2018-02-08 NOTE — Telephone Encounter (Signed)
Medication Samples have been provided to the patient.  Drug name: Repatha      Strength:420mg         Qty: 1   LOT: [id number]  Exp.Date: 06/20   The patient has been instructed regarding the correct time, dose, and frequency of taking this medication, including desired effects and most common side effects.   Kaimana Neuzil [name]-Guzman PharmD, BCPS, [place] [address] [place] [address] 02/08/2018 3:33 PM

## 2018-05-11 NOTE — Progress Notes (Signed)
HPI: FU hyperlipidemia. Nuclear study in 2005 showed an ejection fraction of 87% and no ischemia. Echocardiogram September 2016 showed vigorous LV function, grade 1 diastolic dysfunction, mild to moderate aortic insufficiency and mild tricuspid regurgitation.  Abdominal CT July 2017 with incidental note of coronary artery disease.  Monitor October 2017 showed sinus rhythm with PACs.  Carotid Dopplers October 2018 showed 40 to 59% right and less than 40% left stenosis.  Followed by vascular surgery.  Patient with statin intolerance being treated with Repatha. Since last seen, she denies dyspnea, chest pain or syncope.  She has continued problems with dizziness and has had an extensive evaluation at [facility].   Current Outpatient Medications  Medication Sig Dispense Refill  . aspirin 81 MG tablet Take 81 mg by mouth daily. Per patient, takes one 81 mg daily    . calcium carbonate (OS-CAL) 600 MG TABS tablet Take 600 mg by mouth 2 (two) times daily with a meal.    . Evolocumab with Infusor 420 MG/3.5ML SOCT Inject 420 mg into the skin every 30 (thirty) days.     . furosemide (LASIX) 20 MG tablet Take 20 mg by mouth.    Marland Kitchen LORazepam (ATIVAN) 1 MG tablet Take 1 mg by mouth daily.    Marland Kitchen losartan (COZAAR) 100 MG tablet Take 100 mg by mouth daily.    Marland Kitchen LYRICA 50 MG capsule Take 1 tablet by mouth daily. Take 1 tab daily as needed for back pain    . meloxicam (MOBIC) 7.5 MG tablet Take 1 tablet by mouth daily as needed. Take 1 tab daily    . Multiple Vitamin (MULTIVITAMIN) tablet Take 1 tablet by mouth daily.    . Omega-3 Fatty Acids (FISH OIL PO) Take 1,400 mg by mouth daily.     Marland Kitchen omeprazole (PRILOSEC) 20 MG capsule Take 20 mg by mouth daily.    . polyethylene glycol (MIRALAX / GLYCOLAX) packet Take 17 g by mouth daily.    . vitamin B-12 (CYANOCOBALAMIN) 1000 MCG tablet Take 1,000 mcg by mouth daily.    . VOLTAREN 1 % GEL Apply 2 g topically daily as needed. For pain     No current  facility-administered medications for this visit.      Past Medical History:  Diagnosis Date  . Allergy   . Carotid artery occlusion   . Diabetes mellitus without complication ([place])   . History of shingles 11-2008  . Hyperlipidemia   . Hypertension   . Kidney stones 2012  . Osteoarthritis of cervical spine   . Stroke [facility]) 12-2009    Past Surgical History:  Procedure Laterality Date  . ABDOMINAL HYSTERECTOMY  1973  . ANKLE RECONSTRUCTION  1990   right - 5 screws & plate  . CHOLECYSTECTOMY  1988  . FOOT FUSION  2005   left - 4 screws  . GALLBLADDER SURGERY  1988   large stone blockage  . [place]   left side  . [place]  . LUMBAR EPIDURAL INJECTION  May-Aug-Sept. 2015/ 08-29-15  . [place]   right  . spinal fusion surgery  2000   C4 and C5  . spinal fusion surgery  2003, 2010   C6 and C7  . SPINE SURGERY  2000   C4-C5 fusion  . SPINE SURGERY  2003   C6-C7 fusion  . SPINE SURGERY  2010   replace broken plate (cervical region)  . STOMACH  SURGERY  2002   mass rt & left (not cancerous)    Social History   Socioeconomic History  . Marital status: Married    Spouse name: Pilar Plate   . Number of children: 3  . Years of education: 25  . Highest education level: Not on file  Occupational History  . Not on file  Social Needs  . Financial resource strain: Not on file  . Food insecurity:    Worry: Not on file    Inability: Not on file  . Transportation needs:    Medical: Not on file    Non-medical: Not on file  Tobacco Use  . Smoking status: Former Smoker    Packs/day: 1.00    Years: 3.00    Pack years: 3.00    Types: Cigarettes    Last attempt to quit: 02/08/1979    Years since quitting: 39.3  . Smokeless tobacco: Never Used  Substance and Sexual Activity  . Alcohol use: No  . Drug use: No  . Sexual activity: Not on file  Lifestyle  . Physical activity:    Days per week: Not on file    Minutes per session: Not  on file  . Stress: Not on file  Relationships  . Social connections:    Talks on phone: Not on file    Gets together: Not on file    Attends religious service: Not on file    Active member of club or organization: Not on file    Attends meetings of clubs or organizations: Not on file    Relationship status: Not on file  . Intimate partner violence:    Fear of current or ex partner: Not on file    Emotionally abused: Not on file    Physically abused: Not on file    Forced sexual activity: Not on file  Other Topics Concern  . Not on file  Social History Narrative   Lives at home with husband, Pilar Plate.   Caffeine use: 2 cups coffee per day.    Family History  Problem Relation Age of Onset  . Dementia Mother   . Heart attack Father     ROS: no fevers or chills, productive cough, hemoptysis, dysphasia, odynophagia, melena, hematochezia, dysuria, hematuria, rash, seizure activity, orthopnea, PND, pedal edema, claudication. Remaining systems are negative.  Physical Exam: Well-developed well-nourished in no acute distress.  Skin is warm and dry.  HEENT is normal.  Neck is supple.  Chest is clear to auscultation with normal expansion.  Cardiovascular exam is regular rate and rhythm.  2/6 systolic murmur Abdominal exam nontender or distended. No masses palpated. Extremities show no edema. neuro grossly intact  ECG-normal sinus rhythm at a rate of 75.  No ST changes.  Personally reviewed  A/P  1 aortic insufficiency-mild to moderate on previous echocardiogram.  I will arrange repeat study.  2 hypertension-blood pressure is elevated but she states typically controlled at home.  She will follow and we will adjust regimen as needed.  3 hyperlipidemia-patient is intolerant to statins.  Continue Repatha.  4 carotid artery disease-followed by vascular surgery.  She will need follow-up carotid Dopplers October 2019.  Continue aspirin.  5 coronary artery disease-patient denies chest  pain.  Continue aspirin.  Intolerant to statins.  Continue Repatha.  Kirk Ruths, MD

## 2018-05-19 ENCOUNTER — Encounter: Payer: Self-pay | Admitting: Cardiology

## 2018-05-19 ENCOUNTER — Ambulatory Visit (INDEPENDENT_AMBULATORY_CARE_PROVIDER_SITE_OTHER): Payer: Medicare Other | Admitting: Cardiology

## 2018-05-19 VITALS — BP 180/92 | HR 75 | Ht <= 58 in | Wt 126.0 lb

## 2018-05-19 DIAGNOSIS — I6523 Occlusion and stenosis of bilateral carotid arteries: Secondary | ICD-10-CM

## 2018-05-19 DIAGNOSIS — I2510 Atherosclerotic heart disease of native coronary artery without angina pectoris: Secondary | ICD-10-CM | POA: Diagnosis not present

## 2018-05-19 DIAGNOSIS — I351 Nonrheumatic aortic (valve) insufficiency: Secondary | ICD-10-CM

## 2018-05-19 DIAGNOSIS — E7800 Pure hypercholesterolemia, unspecified: Secondary | ICD-10-CM

## 2018-05-19 DIAGNOSIS — I10 Essential (primary) hypertension: Secondary | ICD-10-CM | POA: Diagnosis not present

## 2018-05-19 NOTE — Patient Instructions (Signed)

## 2018-05-23 ENCOUNTER — Ambulatory Visit (HOSPITAL_COMMUNITY): Payer: Medicare Other | Attending: Cardiology

## 2018-05-23 ENCOUNTER — Other Ambulatory Visit: Payer: Self-pay

## 2018-05-23 DIAGNOSIS — I2510 Atherosclerotic heart disease of native coronary artery without angina pectoris: Secondary | ICD-10-CM | POA: Insufficient documentation

## 2018-05-23 DIAGNOSIS — I119 Hypertensive heart disease without heart failure: Secondary | ICD-10-CM | POA: Insufficient documentation

## 2018-05-23 DIAGNOSIS — I083 Combined rheumatic disorders of mitral, aortic and tricuspid valves: Secondary | ICD-10-CM | POA: Insufficient documentation

## 2018-05-23 DIAGNOSIS — Z8673 Personal history of transient ischemic attack (TIA), and cerebral infarction without residual deficits: Secondary | ICD-10-CM | POA: Insufficient documentation

## 2018-05-23 DIAGNOSIS — I779 Disorder of arteries and arterioles, unspecified: Secondary | ICD-10-CM | POA: Insufficient documentation

## 2018-05-23 DIAGNOSIS — E119 Type 2 diabetes mellitus without complications: Secondary | ICD-10-CM | POA: Diagnosis not present

## 2018-05-23 DIAGNOSIS — I351 Nonrheumatic aortic (valve) insufficiency: Secondary | ICD-10-CM

## 2018-05-23 NOTE — Addendum Note (Signed)
Addended by: Waylan Rocher on: 05/23/2018 03:09 PM   Modules accepted: Orders

## 2018-10-18 ENCOUNTER — Other Ambulatory Visit: Payer: Self-pay | Admitting: Family Medicine

## 2018-10-18 DIAGNOSIS — R42 Dizziness and giddiness: Secondary | ICD-10-CM

## 2018-10-18 DIAGNOSIS — H5319 Other subjective visual disturbances: Secondary | ICD-10-CM

## 2018-10-22 ENCOUNTER — Other Ambulatory Visit: Payer: Self-pay | Admitting: Pharmacist Clinician (PhC)/ Clinical Pharmacy Specialist

## 2018-10-22 ENCOUNTER — Telehealth: Payer: Self-pay | Admitting: Pharmacist Clinician (PhC)/ Clinical Pharmacy Specialist

## 2018-10-22 DIAGNOSIS — E7800 Pure hypercholesterolemia, unspecified: Secondary | ICD-10-CM

## 2018-10-22 NOTE — Telephone Encounter (Signed)
Mailed new Safety Net application and lab order 

## 2018-10-28 ENCOUNTER — Ambulatory Visit
Admission: RE | Admit: 2018-10-28 | Discharge: 2018-10-28 | Disposition: A | Discharge status: Home / Self Care | Payer: Medicare Other | Source: Ambulatory Visit | Attending: Family Medicine | Admitting: Family Medicine

## 2018-10-28 DIAGNOSIS — R42 Dizziness and giddiness: Secondary | ICD-10-CM

## 2018-10-28 DIAGNOSIS — H5319 Other subjective visual disturbances: Secondary | ICD-10-CM

## 2018-10-28 MED ORDER — GADOBUTROL 1 MMOL/ML IV SOLN
4.0000 mL | Freq: Once | INTRAVENOUS | Status: AC | PRN
  Administered 2018-10-28: 4 mL via INTRAVENOUS

## 2019-01-05 ENCOUNTER — Other Ambulatory Visit: Payer: Self-pay | Admitting: Family Medicine

## 2019-01-10 ENCOUNTER — Other Ambulatory Visit: Payer: Self-pay

## 2019-01-10 DIAGNOSIS — I6523 Occlusion and stenosis of bilateral carotid arteries: Secondary | ICD-10-CM

## 2019-01-14 NOTE — Progress Notes (Signed)
HISTORY AND PHYSICAL     CC:  follow up. Requesting Provider:  Derinda Late, MD  HPI: This is a 83 y.o. female here for follow up for carotid artery stenosis. .  Pt was last seen October 2018 and has been followed by Dr. Kellie Simmering in the past.  She has hx of severe back problems in 2011 and was on Vanc for infection for this. She has hx of multiple lumbar and cervical spine operations and she reported left leg discomfort related to this.  She has a hx of a right subcortical small vessel CVA in 2011.     She has hx of shingles in 2011 hospitalization as well as DVT.     She presents today with c/o more vision problems and feeling unbalanced.  She state that her head is very distorted and describes it as a heavy feeling.  She states that everything feels distorted.  She states she feels this way most all the time with only rare moments when she doesn't feel this way.  Her daughter states it has gotten much worse.  She needs her walker for balance.  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  Pt has seen Dr. Jaynee Eagles in the past and has an appointment to see her tomorrow.   The pt is not on a statin for cholesterol management.  The pt is not diabetic.   The pt is on ARB  for hypertension.   Tobacco hx:  Remote tobacco hx-quit 1980 The pt is on aspirin.  Past Medical History:  Diagnosis Date  . Allergy   . Carotid artery occlusion   . Diabetes mellitus without complication ([place])   . History of shingles 11-2008  . Hyperlipidemia   . Hypertension   . Kidney stones 2012  . Osteoarthritis of cervical spine   . Stroke [facility]) 12-2009    Past Surgical History:  Procedure Laterality Date  . ABDOMINAL HYSTERECTOMY  1973  . ANKLE RECONSTRUCTION  1990   right - 5 screws & plate  . CHOLECYSTECTOMY  1988  . FOOT FUSION  2005   left - 4  screws  . GALLBLADDER SURGERY  1988   large stone blockage  . [place]   left side  . [place]  . LUMBAR EPIDURAL INJECTION  May-Aug-Sept. 2015/ 08-29-15  . [place]   right  . spinal fusion surgery  2000   C4 and C5  . spinal fusion surgery  2003, 2010   C6 and C7  . SPINE SURGERY  2000   C4-C5 fusion  . SPINE SURGERY  2003   C6-C7 fusion  . SPINE SURGERY  2010   replace broken plate (cervical region)  . STOMACH SURGERY  2002   mass rt & left (not cancerous)    Allergies  Allergen Reactions  . Plavix [Clopidogrel Bisulfate] Other (See Comments)    Shakes and teeth chattering, "just didn't feel right"  . Prinivil [Lisinopril] Swelling    Lip swelling Lip swelling  . Sulfa Antibiotics Hives  . Sulfasalazine Hives  . Metronidazole     She got very sick  . Clopidogrel Palpitations    Current Outpatient Medications  Medication Sig Dispense Refill  . aspirin 81 MG tablet Take 81 mg by mouth daily. Per patient, takes one 81 mg daily    .  calcium carbonate (OS-CAL) 600 MG TABS tablet Take 600 mg by mouth 2 (two) times daily with a meal.    . Evolocumab with Infusor 420 MG/3.5ML SOCT Inject 420 mg into the skin every 30 (thirty) days.     . furosemide (LASIX) 20 MG tablet Take 20 mg by mouth.    Marland Kitchen LORazepam (ATIVAN) 1 MG tablet Take 1 mg by mouth daily.    Marland Kitchen losartan (COZAAR) 100 MG tablet Take 100 mg by mouth daily.    Marland Kitchen LYRICA 50 MG capsule Take 1 tablet by mouth daily. Take 1 tab daily as needed for back pain    . meloxicam (MOBIC) 7.5 MG tablet Take 1 tablet by mouth daily as needed. Take 1 tab daily    . Multiple Vitamin (MULTIVITAMIN) tablet Take 1 tablet by mouth daily.    . Omega-3 Fatty Acids (FISH OIL PO) Take 1,400 mg by mouth daily.     Marland Kitchen omeprazole (PRILOSEC) 20 MG capsule Take 20 mg by mouth daily.    . polyethylene glycol (MIRALAX / GLYCOLAX) packet Take 17 g by mouth daily.    . vitamin B-12 (CYANOCOBALAMIN) 1000  MCG tablet Take 1,000 mcg by mouth daily.    . VOLTAREN 1 % GEL Apply 2 g topically daily as needed. For pain     No current facility-administered medications for this visit.     Family History  Problem Relation Age of Onset  . Dementia Mother   . Heart attack Father     Social History   Socioeconomic History  . Marital status: Married    Spouse name: Pilar Plate   . Number of children: 3  . Years of education: 5  . Highest education level: Not on file  Occupational History  . Not on file  Social Needs  . Financial resource strain: Not on file  . Food insecurity:    Worry: Not on file    Inability: Not on file  . Transportation needs:    Medical: Not on file    Non-medical: Not on file  Tobacco Use  . Smoking status: Former Smoker    Packs/day: 1.00    Years: 3.00    Pack years: 3.00    Types: Cigarettes    Last attempt to quit: 02/08/1979    Years since quitting: 39.9  . Smokeless tobacco: Never Used  Substance and Sexual Activity  . Alcohol use: No  . Drug use: No  . Sexual activity: Not on file  Lifestyle  . Physical activity:    Days per week: Not on file    Minutes per session: Not on file  . Stress: Not on file  Relationships  . Social connections:    Talks on phone: Not on file    Gets together: Not on file    Attends religious service: Not on file    Active member of club or organization: Not on file    Attends meetings of clubs or organizations: Not on file    Relationship status: Not on file  . Intimate partner violence:    Fear of current or ex partner: Not on file    Emotionally abused: Not on file    Physically abused: Not on file    Forced sexual activity: Not on file  Other Topics Concern  . Not on file  Social History Narrative   Lives at home with husband, Pilar Plate.   Caffeine use: 2 cups coffee per day.     REVIEW OF SYSTEMS:   [  X] denotes positive finding, [ ]  denotes negative finding Cardiac  Comments:  Chest pain or chest pressure:     Shortness of breath upon exertion:    Short of breath when lying flat:    Irregular heart rhythm:        Vascular    Pain in calf, thigh, or hip brought on by ambulation:    Pain in feet at night that wakes you up from your sleep:  x   Blood clot in your veins:    Leg swelling:         Pulmonary    Oxygen at home:    Productive cough:     Wheezing:         Neurologic    Sudden weakness in arms or legs:  x Continuous See HPI  Sudden numbness in arms or legs:     Sudden onset of difficulty speaking or slurred speech:    Temporary loss of vision in one eye:     Problems with dizziness:  x distorted      Gastrointestinal    Blood in stool:     Vomited blood:         Genitourinary    Burning when urinating:     Blood in urine:        Psychiatric    Major depression:         Hematologic    Bleeding problems:    Problems with blood clotting too easily:        Skin    Rashes or ulcers:        Constitutional    Fever or chills:      PHYSICAL EXAMINATION:  Today's Vitals   01/16/19 1132 01/16/19 1134 01/16/19 1302  BP: (!) 193/73 (!) 202/89 (!) 177/76  Pulse: 71  70  Resp: 18    SpO2: 98%    Weight: 124 lb (56.2 kg)    Height: 4\' 10"  (1.473 m)     Body mass index is 25.92 kg/m.   General:  WDWN in NAD; vital signs documented above Gait: Not observed HENT: WNL, normocephalic Pulmonary: normal non-labored breathing , without Rales, rhonchi,  wheezing Cardiac: regular HR, without  Murmurs, rubs or gallops; without carotid bruits Abdomen: soft, NT, no masses Skin: without rashes Vascular Exam/Pulses:  Right Left  Radial 2+ (normal) 2+ (normal)  Ulnar 1+ (weak) 1+ (weak)  Popliteal Unable to palpate  Unable to palpate   DP Unable to palpate  Unable to palpate   PT Unable to palpate  Unable to palpate    Extremities: without ischemic changes, without Gangrene , without cellulitis; without open wounds;  Musculoskeletal: no muscle wasting or  atrophy  Neurologic: A&O X 3; moving extremities equally Psychiatric:  The pt has Normal affect.   Non-Invasive Vascular Imaging:   Carotid Duplex on 01/16/2019: Right:  ICA stenosis 1-39%  Non-hemodynamically significant plaque <50% noted in the CCA. ICA velocities and category of stenosis have decreased when compared to previous exam of 10/05/2017. Left: 1-39% ICA stenosis  Essentially unchanged from previous exam. Vertebrals:  Left vertebral artery demonstrates antegrade flow. Right vertebral artery demonstrates high resistant flow. Subclavians: Normal flow hemodynamics were seen in bilateral subclavian arteries.  MRA head 10/28/18: IMPRESSION: 1. 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 2. No acute parenchymal abnormality. 3. Normal MRI appearance of brain for age. 4. Anterior cervical fusion. 5.  Anterior circulation is within normal.  Previous Carotid duplex on: Right: 40-59% stenosis Left:   <40% stenosis Bilateral vertebral artery flow is antegrade.  Bilateral subclavian artery waveforms are normal.  Increased velocity in the right ICA compared to the last exam on 09-28-16, but stable compared to the exam on 09-23-15  Pt meds includes: Statin:  No. Beta Blocker:  No. Aspirin:  Yes.   ACEI:  No. ARB:  Yes.   CCB use:  No Other Antiplatelet/Anticoagulant:  No   ASSESSMENT/PLAN:: 83 y.o. female here for follow up carotid artery stenosis.   -pt carotid duplex reveals 1-39% bilateral ICA stenosis and right actually improved velocities on today's exam.  Discussed stroke sx with pt and she knows to proceed to ER should she develop any of these sx. -pt does have sx that she describes as distortion with her vision and how she feels overall as well as unbalanced.  She did have  mid basilar artery measuring approximately 50% relative to the more distal  vessel. 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  The right vertebral artery had high resistant flow on duplex today.  She may benefit from consult with interventional radiology for possible intervention. She has appointment with Dr. Jaynee Eagles tomorrow and will defer treatment plan to her.   -she was hypertensive upon arrival in the exam room today with a systolic of 409 and came down to 193.  At the end of the visit, I rechecked her blood pressure and repeat was 177/76.   -we will see her back in one year with repeat carotid duplex.    Leontine Locket, PA-C [facility] [phone]  Clinic MD:  Early

## 2019-01-16 ENCOUNTER — Encounter: Payer: Self-pay | Admitting: Physician Assistant

## 2019-01-16 ENCOUNTER — Other Ambulatory Visit: Payer: Self-pay

## 2019-01-16 ENCOUNTER — Ambulatory Visit (HOSPITAL_COMMUNITY)
Admission: RE | Admit: 2019-01-16 | Discharge: 2019-01-16 | Disposition: A | Discharge status: Home / Self Care | Payer: Medicare Other | Source: Ambulatory Visit | Attending: Family | Admitting: Family

## 2019-01-16 ENCOUNTER — Telehealth: Payer: Self-pay | Admitting: *Deleted

## 2019-01-16 ENCOUNTER — Ambulatory Visit (INDEPENDENT_AMBULATORY_CARE_PROVIDER_SITE_OTHER): Payer: Medicare Other | Admitting: Physician Assistant

## 2019-01-16 VITALS — BP 177/76 | HR 70 | Resp 18 | Ht <= 58 in | Wt 124.0 lb

## 2019-01-16 DIAGNOSIS — I6523 Occlusion and stenosis of bilateral carotid arteries: Secondary | ICD-10-CM

## 2019-01-16 DIAGNOSIS — R2689 Other abnormalities of gait and mobility: Secondary | ICD-10-CM | POA: Diagnosis not present

## 2019-01-16 NOTE — Telephone Encounter (Signed)
Spoke with [name]. and dtr. [name]. I explained that I have spoken with Dr. Jaynee Eagles. She is not able to identify a neurologic cause for [name]'s dizziness, despite an extensive w/u. She does not feel she has anything further to offer [name].  [name]. and dtr. verbalized understanding of same, and [name] sts. [name]. was seen by vein/vascular clinic today, had carotid arteries checked and was told they could  not identify a cause for dizziness either. I have apologized to [name]./dtr., but at this time will cancel [name]'s appt. with Dr. Jaynee Eagles tomorrow, as Dr. Jaynee Eagles has nothing new to offer./fim

## 2019-01-17 ENCOUNTER — Telehealth: Payer: Self-pay | Admitting: Neurology

## 2019-01-17 ENCOUNTER — Encounter

## 2019-01-17 ENCOUNTER — Ambulatory Visit: Payer: Medicare Other | Admitting: Neurology

## 2019-01-17 NOTE — Telephone Encounter (Signed)
Zurie.Degree Dr Collier Salina Blomgren's office states he would like to speak with Dr Jaynee Eagles re: symptoms this pt is having.  He would like a call from Dr Jaynee Eagles on tomorrow.  Sherri stated the best time to reach him is in the 1:00 hour

## 2019-01-17 NOTE — Telephone Encounter (Signed)
Dr. Jaynee Eagles aware and will return Dr. Deboraha Sprang call tomorrow/fim

## 2019-01-21 NOTE — Telephone Encounter (Signed)
I spoke to Dr. Sandi Mariscal, she is still having dizziness. There was a question of nystagmus and I recommended a f/u MRI brain which was already completed. On MRA of the head there was possible worsening basilar stenosis, I recommended a CTA of the head and pending results send to Dr. Estanislado Pandy for cerebral angio. Dr. Sandi Mariscal will take care of this and she will be ssen in our office in the future if needed. thanks

## 2019-01-23 ENCOUNTER — Other Ambulatory Visit: Payer: Self-pay | Admitting: Family Medicine

## 2019-01-23 DIAGNOSIS — R42 Dizziness and giddiness: Secondary | ICD-10-CM

## 2019-01-23 DIAGNOSIS — R270 Ataxia, unspecified: Secondary | ICD-10-CM

## 2019-01-30 ENCOUNTER — Other Ambulatory Visit: Payer: Self-pay | Admitting: Pharmacist Clinician (PhC)/ Clinical Pharmacy Specialist

## 2019-01-30 MED ORDER — EVOLOCUMAB WITH INFUSOR 420 MG/3.5ML ~~LOC~~ SOCT: 420.0000 mg | SUBCUTANEOUS | 12 refills | Status: DC

## 2019-01-31 ENCOUNTER — Ambulatory Visit
Admission: RE | Admit: 2019-01-31 | Discharge: 2019-01-31 | Disposition: A | Discharge status: Home / Self Care | Payer: Medicare Other | Source: Ambulatory Visit | Attending: Family Medicine | Admitting: Family Medicine

## 2019-01-31 ENCOUNTER — Encounter: Payer: Self-pay | Admitting: Radiology

## 2019-01-31 DIAGNOSIS — R42 Dizziness and giddiness: Secondary | ICD-10-CM

## 2019-01-31 DIAGNOSIS — R270 Ataxia, unspecified: Secondary | ICD-10-CM

## 2019-01-31 MED ORDER — IOPAMIDOL (ISOVUE-370) INJECTION 76%
60.0000 mL | Freq: Once | INTRAVENOUS | Status: AC | PRN
  Administered 2019-01-31: 60 mL via INTRAVENOUS

## 2019-03-14 ENCOUNTER — Telehealth: Payer: Self-pay

## 2019-03-14 NOTE — Telephone Encounter (Signed)
Called the pt to get pt assistance going they will email the [employer] forms and apply for the [employer] as well and will be in contact w/me if healthwell approves

## 2019-04-17 ENCOUNTER — Other Ambulatory Visit: Payer: Self-pay | Admitting: Pharmacist

## 2019-04-17 MED ORDER — EVOLOCUMAB WITH INFUSOR 420 MG/3.5ML ~~LOC~~ SOCT: 420.0000 mg | SUBCUTANEOUS | 4 refills | Status: DC

## 2019-05-01 NOTE — Telephone Encounter (Signed)
Patient has [employer] assistance approved thru 02/2020.  Will need to re-do PA in December for [facility] to continue in 2021

## 2019-06-28 ENCOUNTER — Telehealth: Payer: Medicare Other | Admitting: Cardiology

## 2019-07-05 ENCOUNTER — Encounter: Payer: Self-pay | Admitting: Cardiology

## 2019-08-08 ENCOUNTER — Other Ambulatory Visit: Payer: Self-pay | Admitting: Family Medicine

## 2019-08-08 DIAGNOSIS — R42 Dizziness and giddiness: Secondary | ICD-10-CM

## 2019-08-08 DIAGNOSIS — R531 Weakness: Secondary | ICD-10-CM

## 2019-08-08 DIAGNOSIS — R0600 Dyspnea, unspecified: Secondary | ICD-10-CM

## 2019-08-08 DIAGNOSIS — R4781 Slurred speech: Secondary | ICD-10-CM

## 2019-08-13 ENCOUNTER — Other Ambulatory Visit: Payer: Self-pay

## 2019-08-13 ENCOUNTER — Ambulatory Visit
Admission: RE | Admit: 2019-08-13 | Discharge: 2019-08-13 | Disposition: A | Discharge status: Home / Self Care | Payer: Medicare Other | Source: Ambulatory Visit | Attending: Family Medicine | Admitting: Family Medicine

## 2019-08-13 DIAGNOSIS — R42 Dizziness and giddiness: Secondary | ICD-10-CM

## 2019-08-13 DIAGNOSIS — R531 Weakness: Secondary | ICD-10-CM

## 2019-08-13 DIAGNOSIS — I6529 Occlusion and stenosis of unspecified carotid artery: Secondary | ICD-10-CM | POA: Insufficient documentation

## 2019-08-13 DIAGNOSIS — R0600 Dyspnea, unspecified: Secondary | ICD-10-CM

## 2019-08-13 DIAGNOSIS — R4781 Slurred speech: Secondary | ICD-10-CM

## 2019-08-13 MED ORDER — GADOBUTROL 1 MMOL/ML IV SOLN
6.0000 mL | Freq: Once | INTRAVENOUS | Status: AC | PRN
  Administered 2019-08-13: 6 mL via INTRAVENOUS

## 2019-09-04 ENCOUNTER — Other Ambulatory Visit: Payer: Self-pay | Admitting: Family Medicine

## 2019-09-04 DIAGNOSIS — R29898 Other symptoms and signs involving the musculoskeletal system: Secondary | ICD-10-CM

## 2019-09-04 DIAGNOSIS — M5416 Radiculopathy, lumbar region: Secondary | ICD-10-CM

## 2019-09-11 ENCOUNTER — Other Ambulatory Visit: Payer: Medicare Other

## 2019-09-11 ENCOUNTER — Other Ambulatory Visit: Payer: Self-pay

## 2019-09-11 ENCOUNTER — Inpatient Hospital Stay (HOSPITAL_COMMUNITY)
Admission: EM | Admit: 2019-09-11 | Discharge: 2019-09-13 | DRG: 641 | Disposition: A | Discharge status: Home Health | Payer: Medicare Other | Attending: Internal Medicine | Admitting: Internal Medicine

## 2019-09-11 ENCOUNTER — Encounter (HOSPITAL_COMMUNITY): Payer: Self-pay | Admitting: Internal Medicine

## 2019-09-11 ENCOUNTER — Emergency Department (HOSPITAL_COMMUNITY): Payer: Medicare Other

## 2019-09-11 DIAGNOSIS — I639 Cerebral infarction, unspecified: Secondary | ICD-10-CM

## 2019-09-11 DIAGNOSIS — Z20828 Contact with and (suspected) exposure to other viral communicable diseases: Secondary | ICD-10-CM | POA: Diagnosis present

## 2019-09-11 DIAGNOSIS — Z981 Arthrodesis status: Secondary | ICD-10-CM

## 2019-09-11 DIAGNOSIS — Z87891 Personal history of nicotine dependence: Secondary | ICD-10-CM

## 2019-09-11 DIAGNOSIS — K5909 Other constipation: Secondary | ICD-10-CM | POA: Diagnosis present

## 2019-09-11 DIAGNOSIS — I5032 Chronic diastolic (congestive) heart failure: Secondary | ICD-10-CM | POA: Diagnosis present

## 2019-09-11 DIAGNOSIS — N183 Chronic kidney disease, stage 3 unspecified: Secondary | ICD-10-CM | POA: Diagnosis present

## 2019-09-11 DIAGNOSIS — Z818 Family history of other mental and behavioral disorders: Secondary | ICD-10-CM

## 2019-09-11 DIAGNOSIS — E1122 Type 2 diabetes mellitus with diabetic chronic kidney disease: Secondary | ICD-10-CM | POA: Diagnosis present

## 2019-09-11 DIAGNOSIS — T502X5A Adverse effect of carbonic-anhydrase inhibitors, benzothiadiazides and other diuretics, initial encounter: Secondary | ICD-10-CM | POA: Diagnosis present

## 2019-09-11 DIAGNOSIS — I2510 Atherosclerotic heart disease of native coronary artery without angina pectoris: Secondary | ICD-10-CM | POA: Diagnosis present

## 2019-09-11 DIAGNOSIS — K219 Gastro-esophageal reflux disease without esophagitis: Secondary | ICD-10-CM | POA: Diagnosis present

## 2019-09-11 DIAGNOSIS — E785 Hyperlipidemia, unspecified: Secondary | ICD-10-CM | POA: Diagnosis present

## 2019-09-11 DIAGNOSIS — R5383 Other fatigue: Secondary | ICD-10-CM

## 2019-09-11 DIAGNOSIS — Z90710 Acquired absence of both cervix and uterus: Secondary | ICD-10-CM

## 2019-09-11 DIAGNOSIS — E1129 Type 2 diabetes mellitus with other diabetic kidney complication: Secondary | ICD-10-CM | POA: Diagnosis present

## 2019-09-11 DIAGNOSIS — E871 Hypo-osmolality and hyponatremia: Secondary | ICD-10-CM | POA: Diagnosis not present

## 2019-09-11 DIAGNOSIS — R531 Weakness: Secondary | ICD-10-CM

## 2019-09-11 DIAGNOSIS — Z882 Allergy status to sulfonamides status: Secondary | ICD-10-CM

## 2019-09-11 DIAGNOSIS — Z7982 Long term (current) use of aspirin: Secondary | ICD-10-CM

## 2019-09-11 DIAGNOSIS — I351 Nonrheumatic aortic (valve) insufficiency: Secondary | ICD-10-CM | POA: Diagnosis present

## 2019-09-11 DIAGNOSIS — I130 Hypertensive heart and chronic kidney disease with heart failure and stage 1 through stage 4 chronic kidney disease, or unspecified chronic kidney disease: Secondary | ICD-10-CM | POA: Diagnosis present

## 2019-09-11 DIAGNOSIS — Z8673 Personal history of transient ischemic attack (TIA), and cerebral infarction without residual deficits: Secondary | ICD-10-CM

## 2019-09-11 DIAGNOSIS — E860 Dehydration: Secondary | ICD-10-CM | POA: Diagnosis present

## 2019-09-11 DIAGNOSIS — I10 Essential (primary) hypertension: Secondary | ICD-10-CM | POA: Diagnosis not present

## 2019-09-11 DIAGNOSIS — Z885 Allergy status to narcotic agent status: Secondary | ICD-10-CM

## 2019-09-11 DIAGNOSIS — Z791 Long term (current) use of non-steroidal anti-inflammatories (NSAID): Secondary | ICD-10-CM

## 2019-09-11 DIAGNOSIS — D72829 Elevated white blood cell count, unspecified: Secondary | ICD-10-CM

## 2019-09-11 DIAGNOSIS — Z8672 Personal history of thrombophlebitis: Secondary | ICD-10-CM

## 2019-09-11 DIAGNOSIS — Z8249 Family history of ischemic heart disease and other diseases of the circulatory system: Secondary | ICD-10-CM

## 2019-09-11 DIAGNOSIS — Z9049 Acquired absence of other specified parts of digestive tract: Secondary | ICD-10-CM

## 2019-09-11 DIAGNOSIS — Z888 Allergy status to other drugs, medicaments and biological substances status: Secondary | ICD-10-CM

## 2019-09-11 DIAGNOSIS — Z79899 Other long term (current) drug therapy: Secondary | ICD-10-CM

## 2019-09-11 LAB — URINALYSIS, ROUTINE W REFLEX MICROSCOPIC
Bilirubin Urine: NEGATIVE
Glucose, UA: NEGATIVE mg/dL
Hgb urine dipstick: NEGATIVE
Ketones, ur: NEGATIVE mg/dL
Leukocytes,Ua: NEGATIVE
Nitrite: NEGATIVE
Protein, ur: NEGATIVE mg/dL
Specific Gravity, Urine: 1.005 (ref 1.005–1.030)
pH: 7 (ref 5.0–8.0)

## 2019-09-11 LAB — CBC
HCT: 42.9 % (ref 36.0–46.0)
Hemoglobin: 14.9 g/dL (ref 12.0–15.0)
MCH: 32.6 pg (ref 26.0–34.0)
MCHC: 34.7 g/dL (ref 30.0–36.0)
MCV: 93.9 fL (ref 80.0–100.0)
Platelets: 250 10*3/uL (ref 150–400)
RBC: 4.57 MIL/uL (ref 3.87–5.11)
RDW: 11.9 % (ref 11.5–15.5)
WBC: 14.2 10*3/uL — ABNORMAL HIGH (ref 4.0–10.5)
nRBC: 0 % (ref 0.0–0.2)

## 2019-09-11 LAB — BASIC METABOLIC PANEL
Anion gap: 13 (ref 5–15)
BUN: 20 mg/dL (ref 8–23)
CO2: 21 mmol/L — ABNORMAL LOW (ref 22–32)
Calcium: 9.2 mg/dL (ref 8.9–10.3)
Chloride: 94 mmol/L — ABNORMAL LOW (ref 98–111)
Creatinine, Ser: 1.32 mg/dL — ABNORMAL HIGH (ref 0.44–1.00)
GFR calc Af Amer: 44 mL/min — ABNORMAL LOW (ref >60)
GFR calc non Af Amer: 38 mL/min — ABNORMAL LOW (ref >60)
Glucose, Bld: 136 mg/dL — ABNORMAL HIGH (ref 70–99)
Potassium: 4 mmol/L (ref 3.5–5.1)
Sodium: 128 mmol/L — ABNORMAL LOW (ref 135–145)

## 2019-09-11 LAB — HEPATIC FUNCTION PANEL
ALT: 24 U/L (ref 0–44)
AST: 21 U/L (ref 15–41)
Albumin: 3.8 g/dL (ref 3.5–5.0)
Alkaline Phosphatase: 57 U/L (ref 38–126)
Bilirubin, Direct: 0.1 mg/dL (ref 0.0–0.2)
Indirect Bilirubin: 0.6 mg/dL (ref 0.3–0.9)
Total Bilirubin: 0.7 mg/dL (ref 0.3–1.2)
Total Protein: 6.4 g/dL — ABNORMAL LOW (ref 6.5–8.1)

## 2019-09-11 LAB — CBG MONITORING, ED
Glucose-Capillary: 106 mg/dL — ABNORMAL HIGH (ref 70–99)
Glucose-Capillary: 86 mg/dL (ref 70–99)

## 2019-09-11 LAB — TROPONIN I (HIGH SENSITIVITY)
Troponin I (High Sensitivity): 6 ng/L (ref <18)
Troponin I (High Sensitivity): 6 ng/L (ref <18)

## 2019-09-11 LAB — AMMONIA: Ammonia: 17 umol/L (ref 9–35)

## 2019-09-11 LAB — TSH: TSH: 1.004 u[IU]/mL (ref 0.350–4.500)

## 2019-09-11 LAB — LACTIC ACID, PLASMA
Lactic Acid, Venous: 1.7 mmol/L (ref 0.5–1.9)
Lactic Acid, Venous: 1.6 mmol/L (ref 0.5–1.9)

## 2019-09-11 MED ORDER — SODIUM CHLORIDE 0.9 % IV SOLN
INTRAVENOUS | Status: DC
  Administered 2019-09-12: 08:00:00 via INTRAVENOUS

## 2019-09-11 MED ORDER — SODIUM CHLORIDE 0.9% FLUSH: 3.0000 mL | Freq: Once | INTRAVENOUS | Status: DC

## 2019-09-11 MED ORDER — SODIUM CHLORIDE 0.9 % IV BOLUS
1000.0000 mL | Freq: Once | INTRAVENOUS | Status: AC
  Administered 2019-09-11: 21:00:00 1000 mL via INTRAVENOUS

## 2019-09-11 NOTE — H&P (Signed)
History and Physical    [name] [id number] DOB: 1936/09/18 DOA: 09/11/2019  Referring MD/NP/PA:   PCP: Derinda Late, MD   Patient coming from:  The patient is coming from home.  At baseline, pt is independent for most of ADL.        Chief Complaint: Generalized weakness  HPI: [name] is a 83 y.o. female with medical history significant of hypertension, diet-controlled diabetes, hyperlipidemia, stroke, GERD, anxiety, dCHF, CKD stage III, CAD, DVT not on anticoagulants, who presents with generalized weakness.  Per her daughter, patient is normally functioning well. She can take care of herself at the baseline.  In the past 3 weeks, patient developed generalized weakness which has been progressively worsening.  Patient does not have unilateral numbness or tingling his extremities.  No facial droop or slurred speech.  No vision loss or hearing loss.  Patient does not have chest pain, shortness of breath, cough, fever or chills.  No symptoms of UTI.  Patient has nausea, but no vomiting diarrhea or abdominal pain.  Patient has decreased oral intake recently.  ED Course: pt was found to have WBC 14.2, pending covid 19 test, negative urinalysis, lactic acid 1.7, 1.6, troponin 6--> 6, ammonia level 17, stable renal function, sodium 128, temperature normal, blood pressure 165/51, bradycardia, oxygen saturation 98-100 percent on room air, negative chest x-ray.  Patient is placed on telemetry bed for observation.  Review of Systems:   General: no fevers, chills, no body weight gain, has poor appetite, has fatigue HEENT: no blurry vision, hearing changes or sore throat Respiratory: no dyspnea, coughing, wheezing CV: no chest pain, no palpitations GI: has nausea, no vomiting, abdominal pain, diarrhea, constipation GU: no dysuria, burning on urination, increased urinary frequency, hematuria  Ext: no leg edema Neuro: no unilateral weakness, numbness, or tingling, no vision change or  hearing loss Skin: no rash, no skin tear. MSK: No muscle spasm, no deformity, no limitation of range of movement in spin Heme: No easy bruising.  Travel history: No recent long distant travel.  Allergy:  Allergies  Allergen Reactions  . Plavix [Clopidogrel Bisulfate] Other (See Comments)    Shakes and teeth chattering, "just didn't feel right"  . Prinivil [Lisinopril] Swelling    Lip swelling Lip swelling  . Sulfa Antibiotics Hives  . Sulfasalazine Hives  . Codeine Nausea And Vomiting  . Metronidazole     She got very sick  . Clopidogrel Palpitations    Past Medical History:  Diagnosis Date  . Allergy   . Carotid artery occlusion   . Chronic diastolic CHF (congestive heart failure) ([place]) 12/2009  . Diabetes mellitus without complication ([place])   . History of shingles 11-2008  . Hyperlipidemia   . Hypertension   . Kidney stones 2012  . Osteoarthritis of cervical spine   . Stroke [facility]) 12-2009    Past Surgical History:  Procedure Laterality Date  . ABDOMINAL HYSTERECTOMY  1973  . ANKLE RECONSTRUCTION  1990   right - 5 screws & plate  . CHOLECYSTECTOMY  1988  . FOOT FUSION  2005   left - 4 screws  . GALLBLADDER SURGERY  1988   large stone blockage  . [place]   left side  . [place]  . LUMBAR EPIDURAL INJECTION  May-Aug-Sept. 2015/ 08-29-15  . [place]   right  . spinal fusion surgery  2000   C4 and C5  . spinal fusion surgery  2003, 2010   C6 and C7  . SPINE SURGERY  2000   C4-C5 fusion  . SPINE SURGERY  2003   C6-C7 fusion  . SPINE SURGERY  2010   replace broken plate (cervical region)  . STOMACH SURGERY  2002   mass rt & left (not cancerous)    Social History:  reports that she quit smoking about 40 years ago. Her smoking use included cigarettes. She has a 3.00 pack-year smoking history. She has never used smokeless tobacco. She reports that she does not drink alcohol or use drugs.  Family History:  Family  History  Problem Relation Age of Onset  . Dementia Mother   . Heart attack Father      Prior to Admission medications   Medication Sig Start Date End Date Taking? Authorizing Provider  aspirin 81 MG tablet Take 81 mg by mouth daily. Per patient, takes one 81 mg daily    [provider]  calcium carbonate (OS-CAL) 600 MG TABS tablet Take 600 mg by mouth 2 (two) times daily with a meal.    [provider]  Evolocumab with Infusor 420 MG/3.5ML SOCT Inject 420 mg into the skin every 30 (thirty) days. 04/17/19   Lelon Perla, MD  furosemide (LASIX) 20 MG tablet Take 20 mg by mouth.    [provider]  LORazepam (ATIVAN) 1 MG tablet Take 1 mg by mouth daily. 09/08/12   [provider]  losartan (COZAAR) 100 MG tablet Take 100 mg by mouth daily.    [provider]  LYRICA 50 MG capsule Take 1 tablet by mouth daily. Take 1 tab daily as needed for back pain 12/05/15   [provider]  meloxicam (MOBIC) 7.5 MG tablet Take 1 tablet by mouth daily as needed. Take 1 tab daily 12/05/15   [provider]  Multiple Vitamin (MULTIVITAMIN) tablet Take 1 tablet by mouth daily.    [provider]  Omega-3 Fatty Acids (FISH OIL PO) Take 1,400 mg by mouth daily.     [provider]  omeprazole (PRILOSEC) 20 MG capsule Take 20 mg by mouth daily. 09/08/12   [provider]  polyethylene glycol (MIRALAX / GLYCOLAX) packet Take 17 g by mouth daily.    [provider]  vitamin B-12 (CYANOCOBALAMIN) 1000 MCG tablet Take 1,000 mcg by mouth daily.    [provider]  VOLTAREN 1 % GEL Apply 2 g topically daily as needed. For pain 09/04/12   [provider]    Physical Exam: Vitals:   09/11/19 2000 09/11/19 2015 09/11/19 2030 09/11/19 2045  BP: (!) 161/52 (!) 157/56 (!) 160/55 (!) 148/72  Pulse: (!) 52 (!) 55 (!) 54 (!) 57  Resp: 18 11 15 16   Temp:      TempSrc:      SpO2: 99% 100% 99% 98%  Weight:       Height:       General: Not in acute distress. Dry mucus and membrane HEENT:       Eyes: PERRL, EOMI, no scleral icterus.       ENT: No discharge from the ears and nose, no pharynx injection, no tonsillar enlargement.        Neck: No JVD, no bruit, no mass felt. Heme: No neck lymph node enlargement. Cardiac: S1/S2, RRR, No murmurs, No gallops or rubs. Respiratory: No rales, wheezing, rhonchi or rubs. GI: Soft, nondistended, nontender, no rebound pain, no organomegaly, BS present. GU: No hematuria Ext:  No pitting leg edema bilaterally. 2+DP/PT pulse bilaterally. Musculoskeletal: No joint deformities, No joint redness or warmth, no limitation of ROM in spin. Skin: No rashes.  Neuro: Alert, oriented X3, cranial nerves II-XII grossly intact, moves all extremities normally. Muscle strength 4/5 in all extremities, sensation to light touch intact. Psych: Patient is not psychotic, no suicidal or hemocidal ideation.  Labs on Admission: I have personally reviewed following labs and imaging studies  CBC: Recent Labs  Lab 09/11/19 1341  WBC 14.2*  HGB 14.9  HCT 42.9  MCV 93.9  PLT [id number]   Basic Metabolic Panel: Recent Labs  Lab 09/11/19 1341  NA 128*  K 4.0  CL 94*  CO2 21*  GLUCOSE 136*  BUN 20  CREATININE 1.32*  CALCIUM 9.2   GFR: Estimated Creatinine Clearance: 24.9 mL/min (A) (by C-G formula based on SCr of 1.32 mg/dL (H)). Liver Function Tests: Recent Labs  Lab 09/11/19 1700  AST 21  ALT 24  ALKPHOS 57  BILITOT 0.7  PROT 6.4*  ALBUMIN 3.8   No results for input(s): LIPASE, AMYLASE in the last 168 hours. Recent Labs  Lab 09/11/19 1628  AMMONIA 17   Coagulation Profile: No results for input(s): INR, PROTIME in the last 168 hours. Cardiac Enzymes: No results for input(s): CKTOTAL, CKMB, CKMBINDEX, TROPONINI in the last 168 hours. BNP (last 3 results) No results for input(s): PROBNP in the last 8760 hours. HbA1C: No results for input(s): HGBA1C in the last 72  hours. CBG: Recent Labs  Lab 09/11/19 1629  GLUCAP 106*   Lipid Profile: No results for input(s): CHOL, HDL, LDLCALC, TRIG, CHOLHDL, LDLDIRECT in the last 72 hours. Thyroid Function Tests: Recent Labs    09/11/19 1700  TSH 1.004   Anemia Panel: No results for input(s): VITAMINB12, FOLATE, FERRITIN, TIBC, IRON, RETICCTPCT in the last 72 hours. Urine analysis:    Component Value Date/Time   COLORURINE YELLOW 09/11/2019 1932   APPEARANCEUR CLEAR 09/11/2019 1932   LABSPEC 1.005 09/11/2019 1932   PHURINE 7.0 09/11/2019 1932   GLUCOSEU NEGATIVE 09/11/2019 [place] NEGATIVE 09/11/2019 [place] 09/11/2019 1932   KETONESUR NEGATIVE 09/11/2019 1932   PROTEINUR NEGATIVE 09/11/2019 1932   UROBILINOGEN 0.2 01/22/2010 0229   NITRITE NEGATIVE 09/11/2019 1932   LEUKOCYTESUR NEGATIVE 09/11/2019 1932   Sepsis Labs: @LABRCNTIP (procalcitonin:4,lacticidven:4) )No results found for this or any previous visit (from the past 240 hour(s)).   Radiological Exams on Admission: Dg Chest Portable 1 View  Result Date: 09/11/2019 CLINICAL DATA:  Fatigue for 3 weeks. EXAM: PORTABLE CHEST 1 VIEW COMPARISON:  10/27/2017 chest radiograph FINDINGS: The cardiomediastinal silhouette is unremarkable. There is no evidence of focal airspace disease, pulmonary edema, suspicious pulmonary nodule/mass, pleural effusion, or pneumothorax. No acute bony abnormalities are identified. LOWER cervical spine fusion changes again noted. IMPRESSION: No active disease. Electronically Signed   By: Margarette Canada M.D.   On: 09/11/2019 16:52     EKG: Independently reviewed.  Sinus rhythm, QTC 399, low voltage, T wave inversion only in lead III   Assessment/Plan Principal Problem:   Hyponatremia Active Problems:   Essential hypertension   CAD (coronary artery disease)   Stroke (HCC)   Type II diabetes mellitus with renal manifestations (HCC)   Generalized weakness   Leukocytosis   Chronic diastolic  CHF (congestive heart failure) (HCC)   GERD (gastroesophageal reflux disease)   CKD (chronic kidney disease), stage III (HCC)   HLD (hyperlipidemia)   Hyponatremia: Sodium 128.  Mental  status normal.  Most likely due to poor oral intake, dehydration and continuation of her Lasix (currently pt is not taking lasix).  -place on tele bed for obs - Will check urine sodium, urine osmolality, serum osmolality. - check TSH - IVF: 1L NS in ED, will continue with IV normal saline at 75 mL/h - f/u by BMP  - avoid over correction too fast due to risk of central pontine myelinolysis  Leukocytosis: WBC 14.2. no fever. Lactic acid is normal 1.7 -->1.6.  No source of infection identified.  Urinalysis negative.  Chest x-ray negative.  Pending COVID-19 test. -Will get blood culture and urine culture to rule out occult infection  Generalized weakness: Likely multifactorial etiology, including hyponatremia and dehydration. No focal neurologic signs, low suspicions for stroke. -PT/OT -IVF as above -ruling out occult infection as above  Chronic diastolic CHF: 2D echo on 0000000 showed EF 60-65% with grade 1 diastolic dysfunction.  Patient does not have leg edema, JVD, no pulmonary edema on chest x-ray.  CHF is compensated.  Clinically patient seems to be dry. -hold lasix  -check BNP  HTN:  -Continue home medications: cozarr -IV hydralazine prn  HLD:  -zetia -pt is on Evolocumab injection monthly  CAD (coronary artery disease): no chest pain -on ASA  Stroke [facility]): -ASA   Diet  Type II diabetes mellitus with renal manifestations ([place]): Last A1c 10.5 on 01/16/10. Patient is not taking meds at home. Blood sugar is 136 on BMP -qAM CBG monitoring -Check A1c  GERD (gastroesophageal reflux disease):  -protonix  CKD (chronic kidney disease), stage III ([place]): seems stable. Cre was 1.47 on 11/19/15. Her Cre is 1.32 and BUN 20 today. -on IVF -f/u by BMP  DVT ppx: SQ Heparin   Code Status: Full  code Family Communication:  Yes, patient's  daughter  at bed side Disposition Plan:  Anticipate discharge back to previous home environment Consults called:  none Admission status: Obs / tele   Date of Service 09/11/2019    [facility]   If 7PM-7AM, please contact night-coverage [email or web address] Password [facility] 09/11/2019, 9:41 PM

## 2019-09-11 NOTE — ED Notes (Signed)
Pt statsshe has had increasing weakness over last 2-3 weeks. Denies other symptoms

## 2019-09-11 NOTE — ED Notes (Signed)
Pt CBG 106. 

## 2019-09-11 NOTE — ED Provider Notes (Signed)
[facility] EMERGENCY DEPARTMENT Provider Note   CSN: [id number] Arrival date & time: 09/11/19  1319     History   Chief Complaint No chief complaint on file.   HPI [name] is a 83 y.o. female.     The history is provided by the patient, a relative and medical records. No language interpreter was used.  Illness Location:  Fatigue Quality:  Generalized Severity:  Severe Onset quality:  Gradual Duration:  2 weeks Timing:  Constant Progression:  Worsening Chronicity:  New Associated symptoms: fatigue and nausea   Associated symptoms: no abdominal pain, no chest pain, no cough, no diarrhea, no fever, no headaches, no loss of consciousness, no myalgias, no rash, no rhinorrhea, no shortness of breath, no sore throat, no vomiting and no wheezing     Past Medical History:  Diagnosis Date   Allergy    Carotid artery occlusion    Diabetes mellitus without complication ([place])    History of shingles 11-2008   Hyperlipidemia    Hypertension    Kidney stones 2012   Osteoarthritis of cervical spine    Stroke [facility]) 12-2009    Patient Active Problem List   Diagnosis Date Noted   Carotid artery occlusion    CAD (coronary artery disease) 08/12/2016   Hyperlipidemia 01/02/2016   Essential hypertension 01/02/2016   Aortic insufficiency 01/02/2016   Dizziness 09/10/2015   Carotid artery stenosis 10/03/2012   CAROTID ARTERY DISEASE 02/19/2011   CVA 03/02/2010   DEEP VENOUS THROMBOPHLEBITIS, LEG, LEFT 03/02/2010   DEGENERATIVE DISC DISEASE, CERVICAL SPINE 03/02/2010   DEGENERATIVE DISC DISEASE, LUMBAR SPINE 03/02/2010   SHINGLES 02/08/2010   DISCITIS 02/08/2010    Past Surgical History:  Procedure Laterality Date   ABDOMINAL HYSTERECTOMY  [place]   right - 5 screws & plate   CHOLECYSTECTOMY  1988   FOOT FUSION  2005   left - 4 screws   GALLBLADDER SURGERY  1988   large stone blockage    HERNIA REPAIR  1963   left side   LUMBAR [place] INJECTION  May-Aug-Sept. 2015/ 08-29-15   ROTATOR CUFF REPAIR  1994   right   spinal fusion surgery  2000   C4 and C5   spinal fusion surgery  2003, 2010   C6 and C7   SPINE SURGERY  2000   C4-C5 fusion   SPINE SURGERY  2003   C6-C7 fusion   SPINE SURGERY  2010   replace broken plate (cervical region)   STOMACH SURGERY  2002   mass rt & left (not cancerous)     OB History   No obstetric history on file.      Home Medications    Prior to Admission medications   Medication Sig Start Date End Date Taking? Authorizing Provider  aspirin 81 MG tablet Take 81 mg by mouth daily. Per patient, takes one 81 mg daily    [provider]  calcium carbonate (OS-CAL) 600 MG TABS tablet Take 600 mg by mouth 2 (two) times daily with a meal.    [provider]  Evolocumab with Infusor 420 MG/3.5ML SOCT Inject 420 mg into the skin every 30 (thirty) days. 04/17/19   Lelon Perla, MD  furosemide (LASIX) 20 MG tablet Take 20 mg by mouth.    [provider]  LORazepam (ATIVAN) 1 MG tablet Take 1 mg by mouth daily. 09/08/12   [provider]  losartan (COZAAR) 100 MG tablet Take 100 mg by mouth daily.    [provider]  LYRICA 50 MG capsule Take 1 tablet by mouth daily. Take 1 tab daily as needed for back pain 12/05/15   [provider]  meloxicam (MOBIC) 7.5 MG tablet Take 1 tablet by mouth daily as needed. Take 1 tab daily 12/05/15   [provider]  Multiple Vitamin (MULTIVITAMIN) tablet Take 1 tablet by mouth daily.    [provider]  Omega-3 Fatty Acids (FISH OIL PO) Take 1,400 mg by mouth daily.     [provider]  omeprazole (PRILOSEC) 20 MG capsule Take 20 mg by mouth daily. 09/08/12   [provider]  polyethylene glycol (MIRALAX / GLYCOLAX) packet Take 17 g by mouth daily.    [provider]  vitamin B-12  (CYANOCOBALAMIN) 1000 MCG tablet Take 1,000 mcg by mouth daily.    [provider]  VOLTAREN 1 % GEL Apply 2 g topically daily as needed. For pain 09/04/12   [provider]    Family History Family History  Problem Relation Age of Onset   Dementia Mother    Heart attack Father     Social History Social History   Tobacco Use   Smoking status: Former Smoker    Packs/day: 1.00    Years: 3.00    Pack years: 3.00    Types: Cigarettes    Quit date: 02/08/1979    Years since quitting: 40.6   Smokeless tobacco: Never Used  Substance Use Topics   Alcohol use: No   Drug use: No     Allergies   Plavix [clopidogrel bisulfate], Prinivil [lisinopril], Sulfa antibiotics, Sulfasalazine, Metronidazole, and Clopidogrel   Review of Systems Review of Systems  Constitutional: Positive for appetite change and fatigue. Negative for activity change, chills, diaphoresis and fever.  HENT: Negative for rhinorrhea and sore throat.   Eyes: Negative for visual disturbance.  Respiratory: Negative for cough, choking, chest tightness, shortness of breath and wheezing.   Cardiovascular: Negative for chest pain, palpitations and leg swelling.  Gastrointestinal: Positive for constipation (chronic) and nausea. Negative for abdominal distention, abdominal pain, diarrhea and vomiting.  Genitourinary: Negative for dysuria.  Musculoskeletal: Negative for back pain, myalgias, neck pain and neck stiffness.  Skin: Negative for rash and wound.  Neurological: Negative for dizziness, loss of consciousness, facial asymmetry, weakness, light-headedness, numbness and headaches.  Psychiatric/Behavioral: Negative for agitation and confusion.     Physical Exam Updated Vital Signs BP (!) 153/54 (BP Location: Left Arm)    Pulse 65    Temp 98.2 F (36.8 C) (Oral)    Resp 16    SpO2 100%   Physical Exam Vitals signs and nursing note reviewed.  Constitutional:      General: She is not in acute  distress.    Appearance: She is well-developed. She is not ill-appearing, toxic-appearing or diaphoretic.  HENT:     Head: Normocephalic and atraumatic.     Right Ear: External ear normal.     Left Ear: External ear normal.     Nose: Nose normal. No congestion or rhinorrhea.     Mouth/Throat:     Mouth: Mucous membranes are moist.     Pharynx: No oropharyngeal exudate or posterior oropharyngeal erythema.  Eyes:     Conjunctiva/sclera: Conjunctivae normal.     Pupils: Pupils are equal, round, and reactive to light.  Neck:     Musculoskeletal: Normal range of motion  and neck supple.  Cardiovascular:     Rate and Rhythm: Normal rate and regular rhythm.     Pulses: Normal pulses.     Heart sounds: Murmur present.  Pulmonary:     Effort: No respiratory distress.     Breath sounds: No stridor. No wheezing, rhonchi or rales.  Chest:     Chest wall: No tenderness.  Abdominal:     General: Abdomen is flat. There is no distension.     Tenderness: There is no abdominal tenderness. There is no right CVA tenderness, left CVA tenderness or rebound.  Musculoskeletal: Normal range of motion.        General: No tenderness.     Right lower leg: No edema.     Left lower leg: No edema.  Skin:    General: Skin is warm.     Capillary Refill: Capillary refill takes less than 2 seconds.     Coloration: Skin is not pale.     Findings: No erythema or rash.  Neurological:     General: No focal deficit present.     Mental Status: She is alert and oriented to person, place, and time.     Cranial Nerves: No cranial nerve deficit.     Sensory: No sensory deficit.     Motor: No weakness or abnormal muscle tone.     Deep Tendon Reflexes: Reflexes are normal and symmetric.  Psychiatric:        Mood and Affect: Mood normal.      ED Treatments / Results  Labs (all labs ordered are listed, but only abnormal results are displayed) Labs Reviewed  BASIC METABOLIC PANEL - Abnormal; Notable for the  following components:      Result Value   Sodium 128 (*)    Chloride 94 (*)    CO2 21 (*)    Glucose, Bld 136 (*)    Creatinine, Ser 1.32 (*)    GFR calc non Af Amer 38 (*)    GFR calc Af Amer 44 (*)    All other components within normal limits  CBC - Abnormal; Notable for the following components:   WBC 14.2 (*)    All other components within normal limits  HEPATIC FUNCTION PANEL - Abnormal; Notable for the following components:   Total Protein 6.4 (*)    All other components within normal limits  CBG MONITORING, ED - Abnormal; Notable for the following components:   Glucose-Capillary 106 (*)    All other components within normal limits  URINE CULTURE  SARS CORONAVIRUS 2 (TAT 6-24 HRS)  URINALYSIS, ROUTINE W REFLEX MICROSCOPIC  AMMONIA  TSH  LACTIC ACID, PLASMA  LACTIC ACID, PLASMA  BASIC METABOLIC PANEL  TROPONIN I (HIGH SENSITIVITY)  TROPONIN I (HIGH SENSITIVITY)    EKG EKG Interpretation  Date/Time:  Tuesday September 11 2019 13:29:42 EDT Ventricular Rate:  70 PR Interval:  160 QRS Duration: 88 QT Interval:  370 QTC Calculation: 399 R Axis:   2 Text Interpretation:  Normal sinus rhythm Normal ECG When compared to prior, no significant changes seen.  No STEMI Confirmed by Antony Blackbird [phone]) on 09/11/2019 4:29:38 PM   Radiology Dg Chest Portable 1 View  Result Date: 09/11/2019 CLINICAL DATA:  Fatigue for 3 weeks. EXAM: PORTABLE CHEST 1 VIEW COMPARISON:  10/27/2017 chest radiograph FINDINGS: The cardiomediastinal silhouette is unremarkable. There is no evidence of focal airspace disease, pulmonary edema, suspicious pulmonary nodule/mass, pleural effusion, or pneumothorax. No acute bony abnormalities are identified. LOWER cervical  spine fusion changes again noted. IMPRESSION: No active disease. Electronically Signed   By: Margarette Canada M.D.   On: 09/11/2019 16:52    Procedures Procedures (including critical care time)  Medications Ordered in ED Medications    sodium chloride flush (NS) 0.9 % injection 3 mL (3 mLs Intravenous Not Given 09/11/19 1935)  0.9 %  sodium chloride infusion (has no administration in time range)  sodium chloride 0.9 % bolus 1,000 mL (1,000 mLs Intravenous New Bag/Given 09/11/19 2105)     Initial Impression / Assessment and Plan / ED Course  I have reviewed the triage vital signs and the nursing notes.  Pertinent labs & imaging results that were available during my care of the patient were reviewed by me and considered in my medical decision making (see chart for details).        CHANE GERRITS is a 83 y.o. female with a past medical history significant for significant carotid artery disease, prior stroke, hypertension, hyperlipidemia, aortic insufficiency, and CAD who presents for worsening fatigue.  Patient reports that she has had years of ordination problems and ambulation problems due to her carotid disease and poor flow to her brainstem by patient report.  She reports that over the last 2-1/2 weeks she has had significant worsening of fatigue and now has no energy to hardly get around the house.  She has never had this before.  She reports no fevers or chills and denies any urinary symptoms.  She reports has had decreased appetite with her less energy.  She denies any constipation or diarrhea that is changed from prior as she has had a history of constipation in the past.  She denies any dark tarry stools.  She denies any chest pain, shortness breath, or palpitations.  She denies any vomiting but reports occasional nausea.  She denies any recent falls or injuries.  She simply reports that she has no energy and is now unable to take care of herself at this time.  Her daughter has been staying with her for the last few days and she is unable to take care of her husband who has Alzheimer's at this time.  Patient says that 1 month ago she had full energy and was able to do everything she needed to but over the last 2 to 3 weeks  and has left her.  On exam, lungs are clear and chest is nontender.  Mild murmur.  Chest abdomen nontender.  Back nontender.  No CVA tenderness.  Good strength and sensation in arms and legs.  Good pulses in extremities.  Clear speech and no facial droop.  Patient is alert and oriented.  Clinically I am concerned for possible dehydration with reported decreased oral intake and her fatigue.  Will check thyroid and other labs to look for causes of her fatigue.  Will look for occult infection contributing to her symptoms.  Due to no significant changes with her coordination, lack of headaches, and fatigue being her primary concern, will hold on further intracranial or neck work-up as she reports these have not changed.  Will instead focus on the fatigue.  Anticipate disposition discussion after work-up is completed with patient and daughter.           Patient's work-up was only significant for hyponatremia and hypochloremia.  Suspect this is related to her decreased oral intake.  Other work-up was reassuring.  Given her severe fatigue, this may be due to hyponatremia.  Had a discussion with family and they  do not feel comfortable taking patient home at this time due to this change from her baseline.  Will admit to hospitalist for further rehydration and monitoring of hyponatremia likely causing the fatigue.  Patient will be admitted.  Final Clinical Impressions(s) / ED Diagnoses   Final diagnoses:  Fatigue, unspecified type  Hyponatremia    ED Discharge Orders    None     Clinical Impression: 1. Fatigue, unspecified type   2. Hyponatremia     Disposition: Admit  This note was prepared with assistance of Dragon voice recognition software. Occasional wrong-word or sound-a-like substitutions may have occurred due to the inherent limitations of voice recognition software.     Basil Buffin, Gwenyth Allegra, MD 09/11/19 2201

## 2019-09-12 DIAGNOSIS — K219 Gastro-esophageal reflux disease without esophagitis: Secondary | ICD-10-CM | POA: Diagnosis present

## 2019-09-12 DIAGNOSIS — I2584 Coronary atherosclerosis due to calcified coronary lesion: Secondary | ICD-10-CM

## 2019-09-12 DIAGNOSIS — Z87891 Personal history of nicotine dependence: Secondary | ICD-10-CM | POA: Diagnosis not present

## 2019-09-12 DIAGNOSIS — Z981 Arthrodesis status: Secondary | ICD-10-CM | POA: Diagnosis not present

## 2019-09-12 DIAGNOSIS — I130 Hypertensive heart and chronic kidney disease with heart failure and stage 1 through stage 4 chronic kidney disease, or unspecified chronic kidney disease: Secondary | ICD-10-CM | POA: Diagnosis present

## 2019-09-12 DIAGNOSIS — Z791 Long term (current) use of non-steroidal anti-inflammatories (NSAID): Secondary | ICD-10-CM | POA: Diagnosis not present

## 2019-09-12 DIAGNOSIS — I5032 Chronic diastolic (congestive) heart failure: Secondary | ICD-10-CM | POA: Diagnosis present

## 2019-09-12 DIAGNOSIS — E871 Hypo-osmolality and hyponatremia: Secondary | ICD-10-CM | POA: Diagnosis present

## 2019-09-12 DIAGNOSIS — K5909 Other constipation: Secondary | ICD-10-CM | POA: Diagnosis present

## 2019-09-12 DIAGNOSIS — Z818 Family history of other mental and behavioral disorders: Secondary | ICD-10-CM | POA: Diagnosis not present

## 2019-09-12 DIAGNOSIS — E785 Hyperlipidemia, unspecified: Secondary | ICD-10-CM | POA: Diagnosis present

## 2019-09-12 DIAGNOSIS — N183 Chronic kidney disease, stage 3 (moderate): Secondary | ICD-10-CM | POA: Diagnosis present

## 2019-09-12 DIAGNOSIS — Z8249 Family history of ischemic heart disease and other diseases of the circulatory system: Secondary | ICD-10-CM | POA: Diagnosis not present

## 2019-09-12 DIAGNOSIS — Z90710 Acquired absence of both cervix and uterus: Secondary | ICD-10-CM | POA: Diagnosis not present

## 2019-09-12 DIAGNOSIS — I2510 Atherosclerotic heart disease of native coronary artery without angina pectoris: Secondary | ICD-10-CM | POA: Diagnosis present

## 2019-09-12 DIAGNOSIS — E1122 Type 2 diabetes mellitus with diabetic chronic kidney disease: Secondary | ICD-10-CM | POA: Diagnosis present

## 2019-09-12 DIAGNOSIS — E860 Dehydration: Secondary | ICD-10-CM | POA: Diagnosis present

## 2019-09-12 DIAGNOSIS — Z79899 Other long term (current) drug therapy: Secondary | ICD-10-CM | POA: Diagnosis not present

## 2019-09-12 DIAGNOSIS — Z20828 Contact with and (suspected) exposure to other viral communicable diseases: Secondary | ICD-10-CM | POA: Diagnosis present

## 2019-09-12 DIAGNOSIS — Z7982 Long term (current) use of aspirin: Secondary | ICD-10-CM | POA: Diagnosis not present

## 2019-09-12 DIAGNOSIS — I351 Nonrheumatic aortic (valve) insufficiency: Secondary | ICD-10-CM | POA: Diagnosis present

## 2019-09-12 DIAGNOSIS — Z8673 Personal history of transient ischemic attack (TIA), and cerebral infarction without residual deficits: Secondary | ICD-10-CM | POA: Diagnosis not present

## 2019-09-12 DIAGNOSIS — Z8672 Personal history of thrombophlebitis: Secondary | ICD-10-CM | POA: Diagnosis not present

## 2019-09-12 DIAGNOSIS — Z9049 Acquired absence of other specified parts of digestive tract: Secondary | ICD-10-CM | POA: Diagnosis not present

## 2019-09-12 DIAGNOSIS — R531 Weakness: Secondary | ICD-10-CM | POA: Diagnosis present

## 2019-09-12 DIAGNOSIS — T502X5A Adverse effect of carbonic-anhydrase inhibitors, benzothiadiazides and other diuretics, initial encounter: Secondary | ICD-10-CM | POA: Diagnosis present

## 2019-09-12 LAB — GLUCOSE, CAPILLARY
Glucose-Capillary: 114 mg/dL — ABNORMAL HIGH (ref 70–99)
Glucose-Capillary: 109 mg/dL — ABNORMAL HIGH (ref 70–99)

## 2019-09-12 LAB — BASIC METABOLIC PANEL
Anion gap: 8 (ref 5–15)
Anion gap: 9 (ref 5–15)
BUN: 16 mg/dL (ref 8–23)
BUN: 17 mg/dL (ref 8–23)
CO2: 24 mmol/L (ref 22–32)
CO2: 24 mmol/L (ref 22–32)
Calcium: 9 mg/dL (ref 8.9–10.3)
Calcium: 9 mg/dL (ref 8.9–10.3)
Chloride: 101 mmol/L (ref 98–111)
Chloride: 102 mmol/L (ref 98–111)
Creatinine, Ser: 1.17 mg/dL — ABNORMAL HIGH (ref 0.44–1.00)
Creatinine, Ser: 1.24 mg/dL — ABNORMAL HIGH (ref 0.44–1.00)
GFR calc Af Amer: 47 mL/min — ABNORMAL LOW (ref >60)
GFR calc Af Amer: 51 mL/min — ABNORMAL LOW (ref >60)
GFR calc non Af Amer: 41 mL/min — ABNORMAL LOW (ref >60)
GFR calc non Af Amer: 44 mL/min — ABNORMAL LOW (ref >60)
Glucose, Bld: 85 mg/dL (ref 70–99)
Glucose, Bld: 94 mg/dL (ref 70–99)
Potassium: 4.3 mmol/L (ref 3.5–5.1)
Potassium: 4.5 mmol/L (ref 3.5–5.1)
Sodium: 134 mmol/L — ABNORMAL LOW (ref 135–145)
Sodium: 134 mmol/L — ABNORMAL LOW (ref 135–145)

## 2019-09-12 LAB — CBC
HCT: 39.8 % (ref 36.0–46.0)
Hemoglobin: 14 g/dL (ref 12.0–15.0)
MCH: 32.6 pg (ref 26.0–34.0)
MCHC: 35.2 g/dL (ref 30.0–36.0)
MCV: 92.8 fL (ref 80.0–100.0)
Platelets: 234 10*3/uL (ref 150–400)
RBC: 4.29 MIL/uL (ref 3.87–5.11)
RDW: 12.1 % (ref 11.5–15.5)
WBC: 13.3 10*3/uL — ABNORMAL HIGH (ref 4.0–10.5)
nRBC: 0 % (ref 0.0–0.2)

## 2019-09-12 LAB — URINE CULTURE: Culture: NO GROWTH

## 2019-09-12 LAB — SARS CORONAVIRUS 2 (TAT 6-24 HRS): SARS Coronavirus 2: NEGATIVE

## 2019-09-12 LAB — CBG MONITORING, ED: Glucose-Capillary: 86 mg/dL (ref 70–99)

## 2019-09-12 LAB — PROCALCITONIN: Procalcitonin: <0.1 ng/mL

## 2019-09-12 LAB — OSMOLALITY, URINE: Osmolality, Ur: 218 mOsm/kg — ABNORMAL LOW (ref 300–900)

## 2019-09-12 LAB — SODIUM, URINE, RANDOM: Sodium, Ur: 58 mmol/L

## 2019-09-12 LAB — OSMOLALITY: Osmolality: 279 mOsm/kg (ref 275–295)

## 2019-09-12 MED ORDER — ASPIRIN 81 MG PO CHEW
81.0000 mg | CHEWABLE_TABLET | Freq: Every day | ORAL | Status: DC
  Administered 2019-09-12 – 2019-09-13 (×2): 81 mg via ORAL
  Filled 2019-09-12 (×2): qty 1

## 2019-09-12 MED ORDER — ONDANSETRON HCL 4 MG/2ML IJ SOLN: 4.0000 mg | Freq: Four times a day (QID) | INTRAMUSCULAR | Status: DC | PRN

## 2019-09-12 MED ORDER — PANTOPRAZOLE SODIUM 40 MG PO TBEC
40.0000 mg | DELAYED_RELEASE_TABLET | Freq: Every day | ORAL | Status: DC
  Administered 2019-09-12 – 2019-09-13 (×2): 40 mg via ORAL
  Filled 2019-09-12 (×2): qty 1

## 2019-09-12 MED ORDER — LORAZEPAM 1 MG PO TABS
1.0000 mg | ORAL_TABLET | Freq: Every day | ORAL | Status: DC
  Filled 2019-09-12: qty 1

## 2019-09-12 MED ORDER — VITAMIN B-12 1000 MCG PO TABS
1000.0000 ug | ORAL_TABLET | Freq: Every day | ORAL | Status: DC
  Administered 2019-09-13: 1000 ug via ORAL
  Filled 2019-09-12: qty 1

## 2019-09-12 MED ORDER — LORAZEPAM 1 MG PO TABS: 1.0000 mg | ORAL_TABLET | Freq: Every day | ORAL | Status: DC

## 2019-09-12 MED ORDER — POLYETHYLENE GLYCOL 3350 17 G PO PACK: 17.0000 g | PACK | Freq: Every day | ORAL | Status: DC | PRN

## 2019-09-12 MED ORDER — ENOXAPARIN SODIUM 30 MG/0.3ML ~~LOC~~ SOLN
30.0000 mg | Freq: Every day | SUBCUTANEOUS | Status: DC
  Administered 2019-09-13: 30 mg via SUBCUTANEOUS
  Filled 2019-09-12 (×2): qty 0.3

## 2019-09-12 MED ORDER — EZETIMIBE 10 MG PO TABS
10.0000 mg | ORAL_TABLET | Freq: Every day | ORAL | Status: DC
  Administered 2019-09-12 – 2019-09-13 (×2): 10 mg via ORAL
  Filled 2019-09-12 (×2): qty 1

## 2019-09-12 MED ORDER — DICLOFENAC SODIUM 1 % TD GEL: 2.0000 g | Freq: Every day | TRANSDERMAL | Status: DC | PRN

## 2019-09-12 MED ORDER — ACETAMINOPHEN 650 MG RE SUPP: 650.0000 mg | Freq: Four times a day (QID) | RECTAL | Status: DC | PRN

## 2019-09-12 MED ORDER — ONDANSETRON HCL 4 MG PO TABS: 4.0000 mg | ORAL_TABLET | Freq: Four times a day (QID) | ORAL | Status: DC | PRN

## 2019-09-12 MED ORDER — ACETAMINOPHEN 325 MG PO TABS: 650.0000 mg | ORAL_TABLET | Freq: Four times a day (QID) | ORAL | Status: DC | PRN

## 2019-09-12 MED ORDER — ADULT MULTIVITAMIN W/MINERALS CH
1.0000 | ORAL_TABLET | Freq: Every day | ORAL | Status: DC
  Administered 2019-09-12 – 2019-09-13 (×2): 1 via ORAL
  Filled 2019-09-12 (×2): qty 1

## 2019-09-12 MED ORDER — LOSARTAN POTASSIUM 50 MG PO TABS
100.0000 mg | ORAL_TABLET | Freq: Every day | ORAL | Status: DC
  Administered 2019-09-12: 100 mg via ORAL
  Filled 2019-09-12: qty 2

## 2019-09-12 MED ORDER — OMEGA-3-ACID ETHYL ESTERS 1 G PO CAPS
1.0000 g | ORAL_CAPSULE | Freq: Every day | ORAL | Status: DC
  Administered 2019-09-12 – 2019-09-13 (×2): 1 g via ORAL
  Filled 2019-09-12 (×2): qty 1

## 2019-09-12 MED ORDER — HYDRALAZINE HCL 20 MG/ML IJ SOLN: 5.0000 mg | INTRAMUSCULAR | Status: DC | PRN

## 2019-09-12 MED ORDER — LORAZEPAM 1 MG PO TABS
1.0000 mg | ORAL_TABLET | Freq: Every day | ORAL | Status: DC
  Administered 2019-09-12: 1 mg via ORAL
  Filled 2019-09-12: qty 1

## 2019-09-12 MED ORDER — CALCIUM CITRATE 950 (200 CA) MG PO TABS
200.0000 mg | ORAL_TABLET | Freq: Every day | ORAL | Status: DC
  Administered 2019-09-12: 200 mg via ORAL
  Filled 2019-09-12: qty 1

## 2019-09-12 NOTE — Progress Notes (Signed)
PROGRESS NOTE  [name] [id number] DOB: [birth year] DOA: 09/11/2019 PCP: Derinda Late, MD  HPI/Recap of past 24 hours: HPI from Dr Grandville Silos is a 83 y.o. female with medical history significant of hypertension, diet-controlled diabetes, hyperlipidemia, stroke, GERD, anxiety, dCHF, CKD stage III, CAD, DVT not on anticoagulants, who presents with generalized weakness. Per her daughter, patient is normally functioning well. She can take care of herself at the baseline.  In the past 3 weeks, patient developed generalized weakness which has been progressively worsening.  Patient does not have unilateral numbness or tingling his extremities.  No facial droop or slurred speech.  No vision loss or hearing loss.  Patient does not have chest pain, shortness of breath, cough, fever or chills.  No symptoms of UTI.  Patient has nausea, but no vomiting diarrhea or abdominal pain.  Patient has decreased oral intake recently. In the ED, pt was found to have WBC 14.2, with hyponatremia. Patient is placed on telemetry bed for observation.   Today, patient denies any new complaints, reports feeling slightly better.  Denies any chest pain, shortness of breath, abdominal pain, nausea/vomiting, diarrhea, fever/chills, dysuria.  Noted to have some generalized weakness.  Assessment/Plan: Principal Problem:   Hyponatremia Active Problems:   Essential hypertension   CAD (coronary artery disease)   Stroke (HCC)   Type II diabetes mellitus with renal manifestations (HCC)   Generalized weakness   Leukocytosis   Chronic diastolic CHF (congestive heart failure) (HCC)   GERD (gastroesophageal reflux disease)   CKD (chronic kidney disease), stage III (HCC)   HLD (hyperlipidemia)  Hyponatremia Sodium 128 on admission, currently improved to 134, status post IV fluids Likely due to poor oral intake and continuation of her Lasix TSH normal DC IV fluids Advised adequate oral intake Repeat BMP in  a.m.  Leukocytosis Ongoing/chronic Afebrile, no signs of infection Procalcitonin negative, lactic acid within normal limits, urinalysis negative, chest x-ray negative Blood culture x2 pending Daily CBC  Generalized weakness Likely due to hyponatremia, no focal neurologic deficit noted PT OT recommend SNF versus home health Social worker on board, working with [facility] and [facility] to see if patient qualifies for home first program Follow-up in a.m. and discharge once home health is established  Chronic diastolic CHF Stable Echo in 04/2018 showed EF of 60 to 65% with grade 1 diastolic dysfunction  Hypertension Stable IV hydralazine PRN  CAD/HLD/history of CVA Continue home regimen  Diabetes mellitus type 2 Not on any home meds Accu-Cheks  CKD stage III Creatinine at baseline 1.17 Daily BMP  GERD Continue Protonix            Malnutrition Type:      Malnutrition Characteristics:      Nutrition Interventions:       Estimated body mass index is 26.13 kg/m as calculated from the following:   Height as of this encounter: 4\' 10"  (1.473 m).   Weight as of this encounter: 56.7 kg.     Code Status: Full  Family Communication: Discussed with daughter  Disposition Plan: Plan for DC on 09/13/2019   Consultants:  None  Procedures:  None  Antimicrobials:  None  DVT prophylaxis: Lovenox   Objective: Vitals:   09/12/19 1230 09/12/19 1315 09/12/19 1345 09/12/19 1630  BP: (!) 140/52 108/80 102/70 (!) 131/37  Pulse: 69 78 79 85  Resp: 17 (!) 21 19   Temp:    98.7 F (37.1 C)  TempSrc:    Oral  SpO2: 99%  96% 99%   Weight:    56.7 kg  Height:    4\' 10"  (1.473 m)    Intake/Output Summary (Last 24 hours) at 09/12/2019 1816 Last data filed at 09/12/2019 0351 Gross per 24 hour  Intake -  Output 900 ml  Net -900 ml   Filed Weights   09/11/19 1630 09/12/19 1630  Weight: 56.7 kg 56.7 kg    Exam:  General: NAD   Cardiovascular: S1, S2  present  Respiratory: CTAB  Abdomen: Soft, nontender, nondistended, bowel sounds present  Musculoskeletal: No bilateral pedal edema noted  Skin: Normal  Psychiatry: Normal mood  Neurology: No focal neurologic deficit noted   Data Reviewed: CBC: Recent Labs  Lab 09/11/19 1341 09/12/19 0512  WBC 14.2* 13.3*  HGB 14.9 14.0  HCT 42.9 39.8  MCV 93.9 92.8  PLT 250 [id number]   Basic Metabolic Panel: Recent Labs  Lab 09/11/19 1341 09/12/19 0019 09/12/19 0512  NA 128* 134* 134*  K 4.0 4.3 4.5  CL 94* 102 101  CO2 21* 24 24  GLUCOSE 136* 94 85  BUN 20 17 16   CREATININE 1.32* 1.24* 1.17*  CALCIUM 9.2 9.0 9.0   GFR: Estimated Creatinine Clearance: 28.1 mL/min (A) (by C-G formula based on SCr of 1.17 mg/dL (H)). Liver Function Tests: Recent Labs  Lab 09/11/19 1700  AST 21  ALT 24  ALKPHOS 57  BILITOT 0.7  PROT 6.4*  ALBUMIN 3.8   No results for input(s): LIPASE, AMYLASE in the last 168 hours. Recent Labs  Lab 09/11/19 1628  AMMONIA 17   Coagulation Profile: No results for input(s): INR, PROTIME in the last 168 hours. Cardiac Enzymes: No results for input(s): CKTOTAL, CKMB, CKMBINDEX, TROPONINI in the last 168 hours. BNP (last 3 results) No results for input(s): PROBNP in the last 8760 hours. HbA1C: No results for input(s): HGBA1C in the last 72 hours. CBG: Recent Labs  Lab 09/11/19 1629 09/11/19 2245 09/12/19 0754 09/12/19 1657  GLUCAP 106* 86 86 114*   Lipid Profile: No results for input(s): CHOL, HDL, LDLCALC, TRIG, CHOLHDL, LDLDIRECT in the last 72 hours. Thyroid Function Tests: Recent Labs    09/11/19 1700  TSH 1.004   Anemia Panel: No results for input(s): VITAMINB12, FOLATE, FERRITIN, TIBC, IRON, RETICCTPCT in the last 72 hours. Urine analysis:    Component Value Date/Time   COLORURINE YELLOW 09/11/2019 1932   APPEARANCEUR CLEAR 09/11/2019 1932   LABSPEC 1.005 09/11/2019 1932   PHURINE 7.0 09/11/2019 1932   GLUCOSEU NEGATIVE 09/11/2019  [place] NEGATIVE 09/11/2019 [place] 09/11/2019 1932   KETONESUR NEGATIVE 09/11/2019 1932   PROTEINUR NEGATIVE 09/11/2019 1932   UROBILINOGEN 0.2 01/22/2010 0229   NITRITE NEGATIVE 09/11/2019 1932   LEUKOCYTESUR NEGATIVE 09/11/2019 1932   Sepsis Labs: @LABRCNTIP (procalcitonin:4,lacticidven:4)  ) Recent Results (from the past 240 hour(s))  Urine culture     Status: None   Collection Time: 09/11/19  4:28 PM   Specimen: Urine, Random  Result Value Ref Range Status   Specimen Description URINE, RANDOM  Final   Special Requests NONE  Final   Culture   Final    NO GROWTH Performed at [facility], [place] [address], [place], [place] [address]    Report Status 09/12/2019 FINAL  Final  SARS CORONAVIRUS 2 (TAT 6-24 HRS) Nasopharyngeal Nasopharyngeal Swab     Status: None   Collection Time: 09/11/19  9:23 PM   Specimen: Nasopharyngeal Swab  Result Value Ref Range Status  SARS Coronavirus 2 NEGATIVE NEGATIVE Final    Comment: (NOTE) SARS-CoV-2 target nucleic acids are NOT DETECTED. The SARS-CoV-2 RNA is generally detectable in upper and lower respiratory specimens during the acute phase of infection. Negative results do not preclude SARS-CoV-2 infection, do not rule out co-infections with other pathogens, and should not be used as the sole basis for treatment or other patient management decisions. Negative results must be combined with clinical observations, patient history, and epidemiological information. The expected result is Negative. Fact Sheet for Patients: SugarRoll.be Fact Sheet for Healthcare Providers: https://www.woods-mathews.com/ This test is not yet approved or cleared by the [place] FDA and  has been authorized for detection and/or diagnosis of SARS-CoV-2 by FDA under an Emergency Use Authorization (EUA). This EUA will remain  in effect (meaning this test can be used) for the duration of the  COVID-19 declaration under Section 56 4(b)(1) of the Act, 21 U.S.C. section 360bbb-3(b)(1), unless the authorization is terminated or revoked sooner. Performed at [facility], [place] [address] [place], [place] [address]       Studies: No results found.  Scheduled Meds: . aspirin  81 mg Oral Daily  . calcium citrate  200 mg of elemental calcium Oral QAC supper  . enoxaparin (LOVENOX) injection  30 mg Subcutaneous Daily  . ezetimibe  10 mg Oral Daily  . LORazepam  1 mg Oral Daily  . losartan  100 mg Oral Daily  . multivitamin with minerals  1 tablet Oral Daily  . omega-3 acid ethyl esters  1 g Oral Daily  . pantoprazole  40 mg Oral Daily  . sodium chloride flush  3 mL Intravenous Once  . vitamin B-12  1,000 mcg Oral Daily    Continuous Infusions:   LOS: 0 days     Alma Friendly, MD [facility]  If 7PM-7AM, please contact night-coverage [email or web address] 09/12/2019, 6:16 PM

## 2019-09-12 NOTE — Progress Notes (Signed)
Cory with [facility] is working with [facility] to review patient's chart to see if she will qualify for Home First program, pending response.   [place], [place]

## 2019-09-12 NOTE — Evaluation (Signed)
Occupational Therapy Evaluation Patient Details Name: [name] MRN: [id number] DOB: 01-19-1936 Today's Date: 09/12/2019    History of Present Illness 83 yo female admitted to ED on 9/15 with 3-week history of weakness, pt with medical diagnosis of hyponatremia. PMH includes carotid artery occlusion, DM, HLD, HTN, CVA 2011, dizziness, L foot fusion, R RTC repair, C4-C7 fusion.   Clinical Impression   Pt requires total +2 with PT during basic transfers. Pt requires mod (A) for bed level positioning and repositioning. Pt has partial family coverage and night time coverage. Pt could benefit from home first programs. Contacted SW regarding [facility] evaluation for qualifications.      Follow Up Recommendations  SNF(if home health services not available)    Equipment Recommendations  Wheelchair cushion (measurements OT);Wheelchair (measurements OT);3 in 1 bedside commode    Recommendations for Other Services Other (comment)(home first services [place])     Precautions / Restrictions Precautions Precautions: Fall Restrictions Weight Bearing Restrictions: No      Mobility Bed Mobility Overal bed mobility: Needs Assistance Bed Mobility: Rolling Rolling: Supervision   Supine to sit: Min assist;HOB elevated Sit to supine: Min assist;HOB elevated   General bed mobility comments: pt sliding to [facility] with mod (A) and bed tilted  Transfers Overall transfer level: Needs assistance Equipment used: Rolling walker (2 wheeled) Transfers: Sit to/from Stand Sit to Stand: Min assist;From elevated surface         General transfer comment: Min assist for steadying upon standing, pt with series of anterior and posterior sways before finding CoM.    Balance Overall balance assessment: Needs assistance Sitting-balance support: No upper extremity supported;Feet supported Sitting balance-Leahy Scale: Good     Standing balance support: Bilateral upper extremity supported Standing  balance-Leahy Scale: Poor Standing balance comment: reliant on external support with dynamic standing                           ADL either performed or assessed with clinical judgement   ADL Overall ADL's : Needs assistance/impaired Eating/Feeding: Modified independent   Grooming: Wash/dry face;Set up;Bed level   Upper Body Bathing: Minimal assistance;Bed level   Lower Body Bathing: Moderate assistance;Bed level       Lower Body Dressing: Maximal assistance                 General ADL Comments: pt aware of incontinence due to leaking pure wick. pt asking fo r(A) . pt fatigued with bed change and peri care     Vision Baseline Vision/History: Wears glasses Additional Comments: pt with dark glasses on in a dark room     Perception     Praxis      Pertinent Vitals/Pain Pain Assessment: No/denies pain     Hand Dominance Right   Extremity/Trunk Assessment Upper Extremity Assessment Upper Extremity Assessment: RUE deficits/detail RUE Deficits / Details: previous RTC repair, cannot elevate shoulder more than 45* without lift assist from LUE   Lower Extremity Assessment Lower Extremity Assessment: Defer to PT evaluation   Cervical / Trunk Assessment Cervical / Trunk Assessment: Normal   Communication Communication Communication: No difficulties   Cognition Arousal/Alertness: Awake/alert Behavior During Therapy: WFL for tasks assessed/performed Overall Cognitive Status: Within Functional Limits for tasks assessed                                 General Comments: pt pleasant thanking staff  and willing to try all task requested   General Comments       Exercises     Shoulder Instructions      Home Living Family/patient expects to be discharged to:: Private residence Living Arrangements: Spouse/significant other Available Help at Discharge: Family;Available PRN/intermittently(daughter stays with pt and pt's husband in the evenings,  gets them started on their days in the morning) Type of Home: House Home Access: Stairs to enter [employer] of Steps: 2-3 Entrance Stairs-Rails: Left;Right Home Layout: One level     Bathroom Shower/Tub: Occupational psychologist: Standard     Home Equipment: Environmental consultant - 2 wheels;Cane - single point   Additional Comments: 2 yorkies "izzy 14 and Jazzy 2". Daughter has lupus and requires infusions so son in law is having to help with patient's spouse. pt has 2 sons that are not able to help daily. pt states she can not afford 24/7 care      Prior Functioning/Environment Level of Independence: Independent with assistive device(s)        Comments: Pt reports walking with RW and taking frequent rest breaks due to fatigue. Pt reports no falls, states she is the primary caregiver for her husband with Alzheimer's. Pt reports supervising her husband's mobility but not physically assisting, lays out his clothes for him, manages his medications. Over the past 2-3 weeks, pt's daughter has been assisting both pt and husband with ADLs and home maintenance, and has been preparing meals for them.        OT Problem List: Decreased strength;Decreased activity tolerance;Impaired balance (sitting and/or standing);Decreased safety awareness;Decreased knowledge of use of DME or AE;Decreased knowledge of precautions;Obesity      OT Treatment/Interventions: Self-care/ADL training;Therapeutic exercise;Energy conservation;DME and/or AE instruction;Manual therapy;Therapeutic activities;Patient/family education;Balance training    OT Goals(Current goals can be found in the care plan section) Acute Rehab OT Goals Patient Stated Goal: to return home but with help OT Goal Formulation: With patient Time For Goal Achievement: 09/26/19 Potential to Achieve Goals: Good  OT Frequency: Min 2X/week   Barriers to D/C: Decreased caregiver support          Co-evaluation               AM-PAC OT "6 Clicks" Daily Activity     Outcome Measure Help from another person eating meals?: None Help from another person taking care of personal grooming?: A Little Help from another person toileting, which includes using toliet, bedpan, or urinal?: A Lot Help from another person bathing (including washing, rinsing, drying)?: A Lot Help from another person to put on and taking off regular upper body clothing?: A Little Help from another person to put on and taking off regular lower body clothing?: Total 6 Click Score: 15   End of Session Nurse Communication: Mobility status;Precautions  Activity Tolerance: Patient tolerated treatment well Patient left: in bed;with call bell/phone within reach;with bed alarm set  OT Visit Diagnosis: Unsteadiness on feet (R26.81);Muscle weakness (generalized) (M62.81)                Time: [id number] OT Time Calculation (min): 39 min Charges:  OT General Charges $OT Visit: 1 Visit OT Evaluation $OT Eval Moderate Complexity: 1 Mod OT Treatments $Self Care/Home Management : 8-22 mins   Jeri Modena, OTR/L  Acute Rehabilitation Services Pager: [phone] Office: [phone] .   Jeri Modena 09/12/2019, 3:33 PM

## 2019-09-12 NOTE — Evaluation (Signed)
Physical Therapy Evaluation Patient Details Name: [name] MRN: [id number] DOB: 1936/10/29 Today's Date: 09/12/2019   History of Present Illness  83 yo female admitted to ED on 9/15 with 3-week history of weakness, pt with medical diagnosis of hyponatremia. PMH includes carotid artery occlusion, DM, HLD, HTN, CVA 2011, dizziness, L foot fusion, R RTC repair, C4-C7 fusion.  Clinical Impression   Pt presents with generalized weakness, difficulty performing mobility tasks, impaired static and dynamic standing balance, decreased activity tolerance, and orthostatic hypotension with mobility. Pt to benefit from acute PT to address deficits. Pt ambulated 65 ft total with min assist for steadying and guiding pt, required 3 standing rest breaks due to fatigue and feeling weak. BP sitting EOB 168/88, and BP post-ambulation 143/69. Pt with resolved symptoms when returned to supine. Given pt's home environment of pt assisting her husband and both pt and husband not having 24/7 assist, as well as pt's mobility deficits, PT recommending SNF level of care post-acutely, versus HHPT with 24/7 assist. PT to progress mobility as tolerated, and will continue to follow acutely.      Follow Up Recommendations SNF(vs HHPT with 24/7 assist)    Equipment Recommendations  None recommended by PT    Recommendations for Other Services       Precautions / Restrictions Precautions Precautions: Fall Restrictions Weight Bearing Restrictions: No      Mobility  Bed Mobility Overal bed mobility: Needs Assistance Bed Mobility: Supine to Sit;Sit to Supine;Rolling Rolling: Min assist   Supine to sit: Min assist;HOB elevated Sit to supine: Min assist;HOB elevated   General bed mobility comments: Min assist for supine<>sit for trunk elevation, positioning in bed upon return to bed. Min assist for completion of roll onto L and R side.  Transfers Overall transfer level: Needs assistance Equipment used: Rolling  walker (2 wheeled) Transfers: Sit to/from Stand Sit to Stand: Min assist;From elevated surface         General transfer comment: Min assist for steadying upon standing, pt with series of anterior and posterior sways before finding CoM.  Ambulation/Gait Ambulation/Gait assistance: Min assist;+2 safety/equipment Gait Distance (Feet): 65 Feet Assistive device: Rolling walker (2 wheeled) Gait Pattern/deviations: Step-to pattern;Step-through pattern;Decreased stride length;Trunk flexed Gait velocity: decr   General Gait Details: Min guard for safety, occasional min assist for steadying. Pt with good placement in RW, with slowed gait. After ~40 ft ambulation, pt reporting she felt weak. Pt with 3 brief standing rest breaks from 40-65 ft ambulation. BP orthostatic (see assessment).  Stairs            Wheelchair Mobility    Modified Rankin (Stroke Patients Only)       Balance Overall balance assessment: Needs assistance Sitting-balance support: No upper extremity supported;Feet supported Sitting balance-Leahy Scale: Good     Standing balance support: Bilateral upper extremity supported Standing balance-Leahy Scale: Poor Standing balance comment: reliant on external support with dynamic standing                             Pertinent Vitals/Pain Pain Assessment: No/denies pain    Home Living Family/patient expects to be discharged to:: Private residence Living Arrangements: Spouse/significant other Available Help at Discharge: Family;Available PRN/intermittently(daughter stays with pt and pt's husband in the evenings, gets them started on their days in the morning) Type of Home: House Home Access: Stairs to enter Entrance Stairs-Rails: Chemical engineer of Steps: 2-3 Home Layout: One level Home Equipment:  Walker - 2 wheels;Cane - single point      Prior Function Level of Independence: Independent with assistive device(s)          Comments: Pt reports walking with RW and taking frequent rest breaks due to fatigue. Pt reports no falls, states she is the primary caregiver for her husband with Alzheimer's. Pt reports supervising her husband's mobility but not physically assisting, lays out his clothes for him, manages his medications. Over the past 2-3 weeks, pt's daughter has been assisting both pt and husband with ADLs and home maintenance, and has been preparing meals for them.     Hand Dominance   Dominant Hand: Right    Extremity/Trunk Assessment   Upper Extremity Assessment Upper Extremity Assessment: RUE deficits/detail RUE Deficits / Details: previous RTC repair, cannot elevate shoulder more than 74* without lift assist from LUE    Lower Extremity Assessment Lower Extremity Assessment: Generalized weakness    Cervical / Trunk Assessment Cervical / Trunk Assessment: Normal  Communication   Communication: No difficulties  Cognition Arousal/Alertness: Awake/alert Behavior During Therapy: WFL for tasks assessed/performed Overall Cognitive Status: Within Functional Limits for tasks assessed                                 General Comments: Pt pleasant with PT      General Comments      Exercises     Assessment/Plan    PT Assessment Patient needs continued PT services  PT Problem List Decreased strength;Decreased mobility;Decreased range of motion;Decreased activity tolerance;Decreased balance;Decreased knowledge of use of DME       PT Treatment Interventions DME instruction;Therapeutic exercise;Gait training;Balance training;Functional mobility training;Therapeutic activities;Patient/family education    PT Goals (Current goals can be found in the Care Plan section)  Acute Rehab PT Goals Patient Stated Goal: stop feeling weak PT Goal Formulation: With patient Time For Goal Achievement: 09/26/19 Potential to Achieve Goals: Good    Frequency Min 2X/week   Barriers to  discharge Decreased caregiver support pt is the one assisting her husband given cognitive deficits, and pt's daughter cannot assist 24/7    Co-evaluation               AM-PAC PT "6 Clicks" Mobility  Outcome Measure Help needed turning from your back to your side while in a flat bed without using bedrails?: A Little Help needed moving from lying on your back to sitting on the side of a flat bed without using bedrails?: A Little Help needed moving to and from a bed to a chair (including a wheelchair)?: A Little Help needed standing up from a chair using your arms (e.g., wheelchair or bedside chair)?: A Little Help needed to walk in hospital room?: A Little Help needed climbing 3-5 steps with a railing? : A Lot 6 Click Score: 17    End of Session Equipment Utilized During Treatment: Gait belt Activity Tolerance: Patient limited by fatigue Patient left: in bed;with call bell/phone within reach;with bed alarm set Nurse Communication: Mobility status;Other (comment)(orthostatic vitals, needs purewick placed) PT Visit Diagnosis: Difficulty in walking, not elsewhere classified (R26.2);Unsteadiness on feet (R26.81)    Time: [id number] PT Time Calculation (min) (ACUTE ONLY): 25 min   Charges:   PT Evaluation $PT Eval Low Complexity: 1 Low PT Treatments $Gait Training: 8-22 mins        Jaleen Finch Conception Chancy, PT Acute Rehabilitation Services Pager [phone]  Office [phone]  Tate Zagal  D Anushree Dorsi 09/12/2019, 2:14 PM

## 2019-09-12 NOTE — Progress Notes (Signed)
OT NOTE  Pt lives at home with spouse who has alzheimer's with behavioral variance. Pt greatly wants to return home to spouse to help with management of him recognizing she is there. A home based program such as "home first" help progress the patient to remain in the home. Please order SW or CM to help address needs. If patient can not qualify for any home based care then she will require SNF placement.    Jeri Modena, OTR/L  Acute Rehabilitation Services Pager: [phone] Office: [phone] .

## 2019-09-13 ENCOUNTER — Encounter (HOSPITAL_COMMUNITY): Payer: Self-pay | Admitting: General Practice

## 2019-09-13 LAB — GLUCOSE, CAPILLARY: Glucose-Capillary: 100 mg/dL — ABNORMAL HIGH (ref 70–99)

## 2019-09-13 LAB — CBC WITH DIFFERENTIAL/PLATELET
Abs Immature Granulocytes: 0.05 10*3/uL (ref 0.00–0.07)
Basophils Absolute: 0.1 10*3/uL (ref 0.0–0.1)
Basophils Relative: 1 %
Eosinophils Absolute: 0.4 10*3/uL (ref 0.0–0.5)
Eosinophils Relative: 4 %
HCT: 38.3 % (ref 36.0–46.0)
Hemoglobin: 13 g/dL (ref 12.0–15.0)
Immature Granulocytes: 1 %
Lymphocytes Relative: 27 %
Lymphs Abs: 2.7 10*3/uL (ref 0.7–4.0)
MCH: 32 pg (ref 26.0–34.0)
MCHC: 33.9 g/dL (ref 30.0–36.0)
MCV: 94.3 fL (ref 80.0–100.0)
Monocytes Absolute: 0.7 10*3/uL (ref 0.1–1.0)
Monocytes Relative: 7 %
Neutro Abs: 6.1 10*3/uL (ref 1.7–7.7)
Neutrophils Relative %: 60 %
Platelets: 224 10*3/uL (ref 150–400)
RBC: 4.06 MIL/uL (ref 3.87–5.11)
RDW: 12.2 % (ref 11.5–15.5)
WBC: 10.1 10*3/uL (ref 4.0–10.5)
nRBC: 0 % (ref 0.0–0.2)

## 2019-09-13 LAB — BASIC METABOLIC PANEL
Anion gap: 8 (ref 5–15)
BUN: 14 mg/dL (ref 8–23)
CO2: 25 mmol/L (ref 22–32)
Calcium: 8.6 mg/dL — ABNORMAL LOW (ref 8.9–10.3)
Chloride: 102 mmol/L (ref 98–111)
Creatinine, Ser: 1.36 mg/dL — ABNORMAL HIGH (ref 0.44–1.00)
GFR calc Af Amer: 42 mL/min — ABNORMAL LOW (ref >60)
GFR calc non Af Amer: 36 mL/min — ABNORMAL LOW (ref >60)
Glucose, Bld: 95 mg/dL (ref 70–99)
Potassium: 4.2 mmol/L (ref 3.5–5.1)
Sodium: 135 mmol/L (ref 135–145)

## 2019-09-13 MED ORDER — LOSARTAN POTASSIUM 100 MG PO TABS: 50.0000 mg | ORAL_TABLET | Freq: Every day | ORAL | Status: DC

## 2019-09-13 MED ORDER — LOSARTAN POTASSIUM 50 MG PO TABS
50.0000 mg | ORAL_TABLET | Freq: Every day | ORAL | Status: DC
  Administered 2019-09-13: 50 mg via ORAL
  Filled 2019-09-13: qty 1

## 2019-09-13 MED ORDER — LORAZEPAM 1 MG PO TABS: 1.0000 mg | ORAL_TABLET | Freq: Every day | ORAL | 0 refills | Status: AC

## 2019-09-13 NOTE — Progress Notes (Signed)
Pt has orders to be discharged. Discharge instructions given and pt has no additional questions at this time. Medication regimen reviewed and pt educated. Pt verbalized understanding and has no additional questions. Telemetry box removed. IV removed and site in good condition. Pt stable and waiting for transportation. 

## 2019-09-13 NOTE — TOC Initial Note (Signed)
Transition of Care [facility]) - Initial/Assessment Note    Patient Details  Name: [name] MRN: [id number] Date of Birth: [birth year]  [facility] CM/SW Contact:    Alberteen Sam, LCSW Phone Number: 09/13/2019, 10:16 AM  Clinical Narrative:                  CSW spoke with patient's daughter Kymme to discuss discharge plan and family support for patient at home. She reports patient has 3 children, her and her two brothers. Kymme states family is prepared to provide 24/7 supervision for patient and her husband who has Alzheimers. She reports she would like patient to go home with home health services at this time, and she will eventually discuss [place] with her family. Kymme has no preference to home health agency at this time.   No DME needed as Kymme reports they have shower chair, 3in1, walker, wheel chair, and all equipment needed at home. Kymme is in agreement with maximizing home services to include [place], OT, RN, Education officer, museum and nurses aide. CSW explained that the social worker that will be following them can assist with assisted living referrals once family decides to move forward with that decision.   CSW has provided referral to [place] with [facility], they are able to accept, Kymme informed.   Expected Discharge Plan: [place] Barriers to Discharge: No Barriers Identified   Patient Goals and CMS Choice   CMS Medicare.gov Compare Post Acute Care list provided to:: Patient Represenative (must comment)(daughter Kymme) Choice offered to / list presented to : Adult Children(Kymme)  Expected Discharge Plan and Services Expected Discharge Plan: [place] Choice: [place] arrangements for the past 2 months: Single Family Home Expected Discharge Date: 09/13/19                         HH Arranged: Social Work, Nurse's Aide, Therapist, sports, OT, PT [place] Agency: [facility]  (formerly [employer]) Date [place]: 09/13/19 Time [place]: 1014 Representative spoke with at [place]: [place] Arrangements/Services Living arrangements for the past 2 months: [place] with:: Spouse Patient language and need for interpreter reviewed:: Yes Do you feel safe going back to the place where you live?: Yes      Need for Family Participation in Patient Care: Yes (Comment) Care giver support system in place?: Yes (comment)   Criminal Activity/Legal Involvement Pertinent to Current Situation/Hospitalization: No - Comment as needed  Activities of Daily Living Home Assistive Devices/Equipment: Cane (specify quad or straight), Walker (specify type), Shower chair without back ADL Screening (condition at time of admission) Patient's cognitive ability adequate to safely complete daily activities?: Yes Is the patient deaf or have difficulty hearing?: No Does the patient have difficulty seeing, even when wearing glasses/contacts?: No Does the patient have difficulty concentrating, remembering, or making decisions?: No Patient able to express need for assistance with ADLs?: Yes Does the patient have difficulty dressing or bathing?: Yes Independently performs ADLs?: Yes (appropriate for developmental age) Does the patient have difficulty walking or climbing stairs?: Yes Weakness of Legs: None Weakness of Arms/Hands: None  Permission Sought/Granted Permission sought to share information with : Case Manager, Customer service manager, Family Supports Permission granted to share information with : Yes, Verbal Permission Granted  Share Information with NAME: Kymme  Permission granted to share info w AGENCY: [place] granted to share info w Relationship: daughter  Permission granted to share info w Contact Information: Kymme  Emotional Assessment Appearance:: Appears stated  age Attitude/Demeanor/Rapport: Gracious Affect (typically observed): Calm Orientation: : Oriented to Self, Oriented to Place, Oriented to  Time, Oriented to Situation Alcohol / Substance Use: Not Applicable Psych Involvement: No (comment)  Admission diagnosis:  Hyponatremia [E87.1] Fatigue, unspecified type [R53.83] Patient Active Problem List   Diagnosis Date Noted  . Carotid artery occlusion   . CAD (coronary artery disease) 08/12/2016  . Hyperlipidemia 01/02/2016  . Essential hypertension 01/02/2016  . Aortic insufficiency 01/02/2016  . Dizziness 09/10/2015  . Carotid artery stenosis 10/03/2012  . CAROTID ARTERY DISEASE 02/19/2011  . CVA 03/02/2010  . DEEP VENOUS THROMBOPHLEBITIS, LEG, LEFT 03/02/2010  . DEGENERATIVE DISC DISEASE, CERVICAL SPINE 03/02/2010  . DEGENERATIVE DISC DISEASE, LUMBAR SPINE 03/02/2010  . SHINGLES 02/08/2010  . DISCITIS 02/08/2010  . Stroke ([place]) 12/2009  . Type II diabetes mellitus with renal manifestations ([place]) 12/2009  . Hyponatremia 12/2009  . Generalized weakness 12/2009  . Leukocytosis 12/2009  . Chronic diastolic CHF (congestive heart failure) ([place]) 12/2009  . GERD (gastroesophageal reflux disease) 12/2009  . CKD (chronic kidney disease), stage III ([place]) 12/2009  . HLD (hyperlipidemia) 12/2009   PCP:  Derinda Late, MD Pharmacy:   [place], [place] - [address] [address] Phone: [phone] Fax: [phone]     Social Determinants of Health (SDOH) Interventions    Readmission Risk Interventions No flowsheet data found.

## 2019-09-13 NOTE — Discharge Summary (Signed)
Physician Discharge Summary  [name] [id number] DOB: 1936-09-16 DOA: 09/11/2019  PCP: Derinda Late, MD  Admit date: 09/11/2019 Discharge date: 09/13/2019  Time spent: 49minutes  Recommendations for Outpatient Follow-up:  1. PCP in 1 week, low-dose Lasix and SSRI discontinued due to symptomatic hyponatremia although it was quite mild   Discharge Diagnoses:  Principal Problem:   Hyponatremia Active Problems:   Essential hypertension   CAD (coronary artery disease)   Stroke (HCC)   Type II diabetes mellitus with renal manifestations (HCC)   Generalized weakness   Leukocytosis   Chronic diastolic CHF (congestive heart failure) (HCC)   GERD (gastroesophageal reflux disease)   CKD (chronic kidney disease), stage III (HCC)   HLD (hyperlipidemia)   Discharge Condition: Stable  Diet recommendation: Low-sodium heart healthy  Filed Weights   09/11/19 1630 09/12/19 1630 09/13/19 0447  Weight: 56.7 kg 56.7 kg 56.4 kg    History of present illness:  Julia G Fowleris a 83 y.o.femalewith medical history significant ofhypertension, diet-controlled diabetes, hyperlipidemia, stroke, GERD, anxiety,dCHF, CKD stage III, CAD, DVT not on anticoagulants, who presents with generalized weakness. Per her daughter, patient is normally functioning well. Shecan take care of herself at the baseline.In the past 3 weeks, patient developed generalized weakness which has been progressively worsening  Hospital Course:   Hyponatremia Sodium 128 on admission, currently improved to 135 gentle hydration and stopping low-dose Lasix and SSRI -Clinically improved mental status and functional status wise, was seen by physical therapy, home health services were recommended and set up at discharge  Leukocytosis -Mild, this was felt to be secondary to hemoconcentration and dehydration resolved with hydration  Generalized weakness Likely due to hyponatremia, no focal neurologic deficit  noted -Ruled out for infection no other metabolic etiology was noted apart from hyponatremia -Improved significantly with correction of hyponatremia  -Hyponatremia was felt to be's likely secondary to her low-dose diuretic and the fact that she was reportedly recently started on an SSRI  -Both these meds were discontinued -PT OT eval completed, home health services set up at discharge  Chronic diastolic CHF Stable Echo in 04/2018 showed EF of 60 to 65% with grade 1 diastolic dysfunction -Clinically dry, hydrated and improved -She is on Lasix 20 mg daily at baseline this was discontinued due to symptomatic hyponatremia, advised salt restriction and monitor for swelling or weight gain in the absence of diuretic  Hypertension Stable  CAD/HLD/history of CVA Continue home regimen  Diabetes mellitus type 2 Not on any home meds -Diet controlled  CKD stage III Creatinine at baseline 1.17 -Stable  GERD Continue Protonix       Discharge Exam: Vitals:   09/13/19 0447 09/13/19 0900  BP: 139/64 122/60  Pulse: 71 93  Resp: 18 12  Temp: 98.5 F (36.9 C) 98.6 F (37 C)  SpO2: 98% 99%    General: AAOx3 Cardiovascular:S1S2/RRR Respiratory:CTAB  Discharge Instructions   Discharge Instructions    Diet - low sodium heart healthy   Complete by: As directed    Increase activity slowly   Complete by: As directed      Allergies as of 09/13/2019      Reactions   Plavix [clopidogrel Bisulfate] Other (See Comments)   Shakes and teeth chattering, "just didn't feel right"   Prinivil [lisinopril] Swelling   Lip swelling Lip swelling   Sulfa Antibiotics Hives   Sulfasalazine Hives   Codeine Nausea And Vomiting   Metronidazole    She got very sick   Clopidogrel Palpitations  Medication List    STOP taking these medications   meloxicam 7.5 MG tablet Commonly known as: MOBIC     TAKE these medications   acetaminophen 500 MG tablet Commonly known as:  TYLENOL Take 500-1,000 mg by mouth every 6 (six) hours as needed for mild pain.   aspirin 81 MG tablet Take 81 mg by mouth daily. Per patient, takes one 81 mg daily   CALCIUM CITRATE PO Take 1 tablet by mouth daily.   Evolocumab with Infusor 420 MG/3.5ML Soct Inject 420 mg into the skin every 30 (thirty) days.   ezetimibe 10 MG tablet Commonly known as: ZETIA Take 10 mg by mouth daily.   FISH OIL PO Take 1,400 mg by mouth daily.   LORazepam 1 MG tablet Commonly known as: ATIVAN Take 1 tablet (1 mg total) by mouth at bedtime. What changed: when to take this   losartan 100 MG tablet Commonly known as: COZAAR Take 0.5 tablets (50 mg total) by mouth daily. What changed: how much to take   multivitamin tablet Take 1 tablet by mouth daily.   omeprazole 20 MG capsule Commonly known as: PRILOSEC Take 20 mg by mouth daily.   polyethylene glycol 17 g packet Commonly known as: MIRALAX / GLYCOLAX Take 17 g by mouth daily.   vitamin B-12 1000 MCG tablet Commonly known as: CYANOCOBALAMIN Take 1,000 mcg by mouth daily.   Voltaren 1 % Gel Generic drug: diclofenac sodium Apply 2 g topically daily as needed. For pain      Allergies  Allergen Reactions  . Plavix [Clopidogrel Bisulfate] Other (See Comments)    Shakes and teeth chattering, "just didn't feel right"  . Prinivil [Lisinopril] Swelling    Lip swelling Lip swelling  . Sulfa Antibiotics Hives  . Sulfasalazine Hives  . Codeine Nausea And Vomiting  . Metronidazole     She got very sick  . Clopidogrel Palpitations   Follow-up Information    Derinda Late, MD. Go on 09/18/2019.   Specialty: Family Medicine Why: @2 :00pm Contact information: [place] [address] [phone]        Home[facility] Follow up.   Specialty: Home Health Services Why: [facility] will reach out to schedule home health days and times.  Contact information: [address] [place]  [address] [phone]            The results of significant diagnostics from this hospitalization (including imaging, microbiology, ancillary and laboratory) are listed below for reference.    Significant Diagnostic Studies: Dg Chest Portable 1 View  Result Date: 09/11/2019 CLINICAL DATA:  Fatigue for 3 weeks. EXAM: PORTABLE CHEST 1 VIEW COMPARISON:  10/27/2017 chest radiograph FINDINGS: The cardiomediastinal silhouette is unremarkable. There is no evidence of focal airspace disease, pulmonary edema, suspicious pulmonary nodule/mass, pleural effusion, or pneumothorax. No acute bony abnormalities are identified. LOWER cervical spine fusion changes again noted. IMPRESSION: No active disease. Electronically Signed   By: Margarette Canada M.D.   On: 09/11/2019 16:52    Microbiology: Recent Results (from the past 240 hour(s))  Urine culture     Status: None   Collection Time: 09/11/19  4:28 PM   Specimen: Urine, Random  Result Value Ref Range Status   Specimen Description URINE, RANDOM  Final   Special Requests NONE  Final   Culture   Final    NO GROWTH Performed at [facility], [address] [place],  [address]    Report  Status 09/12/2019 FINAL  Final  SARS CORONAVIRUS 2 (TAT 6-24 HRS) Nasopharyngeal Nasopharyngeal Swab     Status: None   Collection Time: 09/11/19  9:23 PM   Specimen: Nasopharyngeal Swab  Result Value Ref Range Status   SARS Coronavirus 2 NEGATIVE NEGATIVE Final    Comment: (NOTE) SARS-CoV-2 target nucleic acids are NOT DETECTED. The SARS-CoV-2 RNA is generally detectable in upper and lower respiratory specimens during the acute phase of infection. Negative results do not preclude SARS-CoV-2 infection, do not rule out co-infections with other pathogens, and should not be used as the sole basis for treatment or other patient management decisions. Negative results must be combined with clinical observations, patient history, and epidemiological  information. The expected result is Negative. Fact Sheet for Patients: SugarRoll.be Fact Sheet for Healthcare Providers: https://www.woods-mathews.com/ This test is not yet approved or cleared by the [place] FDA and  has been authorized for detection and/or diagnosis of SARS-CoV-2 by FDA under an Emergency Use Authorization (EUA). This EUA will remain  in effect (meaning this test can be used) for the duration of the COVID-19 declaration under Section 56 4(b)(1) of the Act, 21 U.S.C. section 360bbb-3(b)(1), unless the authorization is terminated or revoked sooner. Performed at [facility], [place] [address], [place], [place] [address]   Culture, blood (x 2)     Status: None (Preliminary result)   Collection Time: 09/12/19  4:45 AM   Specimen: BLOOD  Result Value Ref Range Status   Specimen Description BLOOD RIGHT ARM  Final   Special Requests   Final    BOTTLES DRAWN AEROBIC AND ANAEROBIC Blood Culture adequate volume   Culture   Final    NO GROWTH 1 DAY Performed at [facility], [place] [address], [place], [place] [address]    Report Status PENDING  Incomplete  Culture, blood (x 2)     Status: None (Preliminary result)   Collection Time: 09/12/19  5:12 AM   Specimen: BLOOD  Result Value Ref Range Status   Specimen Description BLOOD RIGHT HAND  Final   Special Requests   Final    BOTTLES DRAWN AEROBIC ONLY Blood Culture results may not be optimal due to an excessive volume of blood received in culture bottles   Culture   Final    NO GROWTH 1 DAY Performed at [facility], [place] [address] [place],  [address]    Report Status PENDING  Incomplete     Labs: Basic Metabolic Panel: Recent Labs  Lab 09/11/19 1341 09/12/19 0019 09/12/19 0512 09/13/19 0531  NA 128* 134* 134* 135  K 4.0 4.3 4.5 4.2  CL 94* 102 101 102  CO2 21* 24 24 25   GLUCOSE 136* 94 85 95  BUN 20 17 16 14   CREATININE 1.32* 1.24* 1.17*  1.36*  CALCIUM 9.2 9.0 9.0 8.6*   Liver Function Tests: Recent Labs  Lab 09/11/19 1700  AST 21  ALT 24  ALKPHOS 57  BILITOT 0.7  PROT 6.4*  ALBUMIN 3.8   No results for input(s): LIPASE, AMYLASE in the last 168 hours. Recent Labs  Lab 09/11/19 1628  AMMONIA 17   CBC: Recent Labs  Lab 09/11/19 1341 09/12/19 0512 09/13/19 0531  WBC 14.2* 13.3* 10.1  NEUTROABS  --   --  6.1  HGB 14.9 14.0 13.0  HCT 42.9 39.8 38.3  MCV 93.9 92.8 94.3  PLT 250 234 224   Cardiac Enzymes: No results for input(s): CKTOTAL, CKMB,  CKMBINDEX, TROPONINI in the last 168 hours. BNP: BNP (last 3 results) No results for input(s): BNP in the last 8760 hours.  ProBNP (last 3 results) No results for input(s): PROBNP in the last 8760 hours.  CBG: Recent Labs  Lab 09/11/19 2245 09/12/19 0754 09/12/19 1657 09/12/19 2125 09/13/19 0603  GLUCAP 86 86 114* 109* 100*       Signed:  Domenic Polite MD.  [facility] 09/13/2019, 2:32 PM

## 2019-09-14 NOTE — Progress Notes (Signed)
Virtual Visit via Video Note   This visit type was conducted due to national recommendations for restrictions regarding the COVID-19 Pandemic (e.g. social distancing) in an effort to limit this patient's exposure and mitigate transmission in our community.  Due to his co-morbid illnesses, this patient is at least at moderate risk for complications without adequate follow up.  This format is felt to be most appropriate for this patient at this time.  All issues noted in this document were discussed and addressed.  A limited physical exam was performed with this format.  Please refer to the patient's chart for his consent to telehealth for [facility].   HPI: FU hyperlipidemia. Nuclear study in 2005 showed an ejection fraction of 87% and no ischemia. Abdominal CT July 2017 with incidental note of coronary artery disease. Monitor October 2017 showed sinus rhythm with PACs. Patient with statin intolerance being treated with Repatha. Echocardiogram repeated May 2019 and showed normal LV function, mild aortic and mitral regurgitation. Carotid Dopplers January 2020 showed 1 to 39% bilateral stenosis.  TIA February 2020 showed stable approximate 50% stenosis of the mid basilar artery.  There was moderate stenosis of the right P2 and left P3 segments.  Since last seen,  she was recently hospitalized with weakness.  She was hyponatremic and felt to be dehydrated.  Lasix was discontinued.  She is slowly regaining her strength.  She denies dyspnea, chest pain, palpitations or syncope.  Current Outpatient Medications  Medication Sig Dispense Refill  . acetaminophen (TYLENOL) 500 MG tablet Take 500-1,000 mg by mouth every 6 (six) hours as needed for mild pain.    Marland Kitchen aspirin 81 MG tablet Take 81 mg by mouth daily. Per patient, takes one 81 mg daily    . CALCIUM CITRATE PO Take 1 tablet by mouth daily.    . carboxymethylcellulose (REFRESH PLUS) 0.5 % SOLN 1 drop 3 (three) times daily as needed.    . Evolocumab  with Infusor 420 MG/3.5ML SOCT Inject 420 mg into the skin every 30 (thirty) days. 3 Cartridge 4  . ezetimibe (ZETIA) 10 MG tablet Take 10 mg by mouth daily.    Marland Kitchen LORazepam (ATIVAN) 1 MG tablet Take 1 tablet (1 mg total) by mouth at bedtime. 30 tablet 0  . losartan (COZAAR) 100 MG tablet Take 0.5 tablets (50 mg total) by mouth daily.    . meloxicam (MOBIC) 7.5 MG tablet Take 7.5 mg by mouth daily.    . Multiple Vitamin (MULTIVITAMIN) tablet Take 1 tablet by mouth daily.    . Omega-3 Fatty Acids (FISH OIL PO) Take 1,400 mg by mouth daily.     Marland Kitchen omeprazole (PRILOSEC) 20 MG capsule Take 20 mg by mouth daily.    . polyethylene glycol (MIRALAX / GLYCOLAX) packet Take 17 g by mouth daily.    . vitamin B-12 (CYANOCOBALAMIN) 1000 MCG tablet Take 1,000 mcg by mouth daily.    . VOLTAREN 1 % GEL Apply 2 g topically daily as needed. For pain     No current facility-administered medications for this visit.      Past Medical History:  Diagnosis Date  . Allergy   . Carotid artery occlusion   . Chronic diastolic CHF (congestive heart failure) ([place]) 12/2009  . Diabetes mellitus without complication ([place])   . History of shingles 11-2008  . Hyperlipidemia   . Hypertension   . Kidney stones 2012  . Osteoarthritis of cervical spine   . Stroke [facility]) 12-2009    Past  Surgical History:  Procedure Laterality Date  . ABDOMINAL HYSTERECTOMY  1973  . ANKLE RECONSTRUCTION  1990   right - 5 screws & plate  . CHOLECYSTECTOMY  1988  . FOOT FUSION  2005   left - 4 screws  . GALLBLADDER SURGERY  1988   large stone blockage  . [place]   left side  . [place]  . LUMBAR EPIDURAL INJECTION  May-Aug-Sept. 2015/ 08-29-15  . [place]   right  . spinal fusion surgery  2000   C4 and C5  . spinal fusion surgery  2003, 2010   C6 and C7  . SPINE SURGERY  2000   C4-C5 fusion  . SPINE SURGERY  2003   C6-C7 fusion  . SPINE SURGERY  2010   replace broken plate (cervical  region)  . STOMACH SURGERY  2002   mass rt & left (not cancerous)    Social History   Socioeconomic History  . Marital status: Married    Spouse name: Pilar Plate   . Number of children: 3  . Years of education: 79  . Highest education level: Not on file  Occupational History  . Not on file  Social Needs  . Financial resource strain: Not on file  . Food insecurity    Worry: Not on file    Inability: Not on file  . Transportation needs    Medical: Not on file    Non-medical: Not on file  Tobacco Use  . Smoking status: Former Smoker    Packs/day: 1.00    Years: 3.00    Pack years: 3.00    Types: Cigarettes    Quit date: 02/08/1979    Years since quitting: 40.6  . Smokeless tobacco: Never Used  Substance and Sexual Activity  . Alcohol use: No  . Drug use: No  . Sexual activity: Not on file  Lifestyle  . Physical activity    Days per week: Not on file    Minutes per session: Not on file  . Stress: Not on file  Relationships  . Social Herbalist on phone: Not on file    Gets together: Not on file    Attends religious service: Not on file    Active member of club or organization: Not on file    Attends meetings of clubs or organizations: Not on file    Relationship status: Not on file  . Intimate partner violence    Fear of current or ex partner: Not on file    Emotionally abused: Not on file    Physically abused: Not on file    Forced sexual activity: Not on file  Other Topics Concern  . Not on file  Social History Narrative   Lives at home with husband, Pilar Plate.   Caffeine use: 2 cups coffee per day.    Family History  Problem Relation Age of Onset  . Dementia Mother   . Heart attack Father     ROS: no fevers or chills, productive cough, hemoptysis, dysphasia, odynophagia, melena, hematochezia, dysuria, hematuria, rash, seizure activity, orthopnea, PND, pedal edema, claudication. Remaining systems are negative.  Physical Exam: No acute distress  Answers questions appropriately Normal affect Remainder physical exam not performed (telehealth visit; coronavirus pandemic)   A/P  1 aortic insufficiency-mild on most recent echocardiogram.  2 hypertension-patient's blood pressure is controlled.  Continue present medications and follow.  3 hyperlipidemia-continue Repatha.  She is intolerant  to statins.  4 coronary artery disease-no chest pain.  Continue medical therapy with aspirin.  She is intolerant to statins.  5 carotid artery disease-followed by vascular surgery.  Kirk Ruths, MD

## 2019-09-17 ENCOUNTER — Telehealth: Payer: Self-pay | Admitting: Cardiology

## 2019-09-17 ENCOUNTER — Other Ambulatory Visit: Payer: Self-pay

## 2019-09-17 ENCOUNTER — Telehealth (INDEPENDENT_AMBULATORY_CARE_PROVIDER_SITE_OTHER): Payer: Medicare Other | Admitting: Cardiology

## 2019-09-17 ENCOUNTER — Encounter: Payer: Self-pay | Admitting: Cardiology

## 2019-09-17 VITALS — BP 140/80 | Ht 58.5 in | Wt 125.0 lb

## 2019-09-17 DIAGNOSIS — I2510 Atherosclerotic heart disease of native coronary artery without angina pectoris: Secondary | ICD-10-CM

## 2019-09-17 DIAGNOSIS — I6523 Occlusion and stenosis of bilateral carotid arteries: Secondary | ICD-10-CM

## 2019-09-17 DIAGNOSIS — I10 Essential (primary) hypertension: Secondary | ICD-10-CM

## 2019-09-17 DIAGNOSIS — I351 Nonrheumatic aortic (valve) insufficiency: Secondary | ICD-10-CM

## 2019-09-17 LAB — CULTURE, BLOOD (ROUTINE X 2)
Culture: NO GROWTH
Culture: NO GROWTH
Special Requests: ADEQUATE

## 2019-09-17 NOTE — Telephone Encounter (Signed)
Virtual Visit Pre-Appointment Phone Call  "(Name), I am calling you today to discuss your upcoming appointment. We are currently trying to limit exposure to the virus that causes COVID-19 by seeing patients at home rather than in the office."  1. "What is the BEST phone number to call the day of the visit?" - include this in appointment notes  2. Do you have or have access to (through a family member/friend) a smartphone with video capability that we can use for your visit?" a. If yes - list this number in appt notes as cell (if different from BEST phone #) and list the appointment type as a VIDEO visit in appointment notes b. If no - list the appointment type as a PHONE visit in appointment notes  3. Confirm consent - "In the setting of the current Covid19 crisis, you are scheduled for a (phone or video) visit with your provider on (date) at (time).  Just as we do with many in-office visits, in order for you to participate in this visit, we must obtain consent.  If you'd like, I can send this to your mychart (if signed up) or email for you to review.  Otherwise, I can obtain your verbal consent now.  All virtual visits are billed to your insurance company just like a normal visit would be.  By agreeing to a virtual visit, we'd like you to understand that the technology does not allow for your provider to perform an examination, and thus may limit your provider's ability to fully assess your condition. If your provider identifies any concerns that need to be evaluated in person, we will make arrangements to do so.  Finally, though the technology is pretty good, we cannot assure that it will always work on either your or our end, and in the setting of a video visit, we may have to convert it to a phone-only visit.  In either situation, we cannot ensure that we have a secure connection.  Are you willing to proceed?" STAFF: Did the patient verbally acknowledge consent to telehealth visit? Document  YES/NO here: yes  4. Advise patient to be prepared - "Two hours prior to your appointment, go ahead and check your blood pressure, pulse, oxygen saturation, and your weight (if you have the equipment to check those) and write them all down. When your visit starts, your provider will ask you for this information. If you have an Apple Watch or Kardia device, please plan to have heart rate information ready on the day of your appointment. Please have a pen and paper handy nearby the day of the visit as well."  5. Give patient instructions for MyChart download to smartphone OR Doximity/Doxy.me as below if video visit (depending on what platform provider is using)  6. Inform patient they will receive a phone call 15 minutes prior to their appointment time (may be from unknown caller ID) so they should be prepared to answer    TELEPHONE CALL NOTE  ARIYELLE SEKI has been deemed a candidate for a follow-up tele-health visit to limit community exposure during the Covid-19 pandemic. I spoke with the patient via phone to ensure availability of phone/video source, confirm preferred email & phone number, and discuss instructions and expectations.  I reminded [name] to be prepared with any vital sign and/or heart rhythm information that could potentially be obtained via home monitoring, at the time of her visit. I reminded [name] to expect a phone call prior to  her visit.  Annita Brod, RN 09/17/2019 8:27 AM   INSTRUCTIONS FOR DOWNLOADING THE MYCHART APP TO SMARTPHONE  - The patient must first make sure to have activated MyChart and know their login information - If Apple, go to [employer] and type in MyChart in the search bar and download the app. If Android, ask patient to go to [employer] and type in [place] in the search bar and download the app. The app is free but as with any other app downloads, their phone may require them to verify saved payment information or  Apple/Android password.  - The patient will need to then log into the app with their MyChart username and password, and select  as their healthcare provider to link the account. When it is time for your visit, go to the MyChart app, find appointments, and click Begin Video Visit. Be sure to Select Allow for your device to access the Microphone and Camera for your visit. You will then be connected, and your provider will be with you shortly.  **If they have any issues connecting, or need assistance please contact MyChart service desk [phone])**  **If using a computer, in order to ensure the best quality for their visit they will need to use either of the following Internet Browsers: [employer], or Google Chrome**  IF USING DOXIMITY or DOXY.ME - The patient will receive a link just prior to their visit by text.     FULL LENGTH CONSENT FOR TELE-HEALTH VISIT   I hereby voluntarily request, consent and authorize [facility] and its employed or contracted physicians, physician assistants, nurse practitioners or other licensed health care professionals (the Practitioner), to provide me with telemedicine health care services (the Services") as deemed necessary by the treating Practitioner. I acknowledge and consent to receive the Services by the Practitioner via telemedicine. I understand that the telemedicine visit will involve communicating with the Practitioner through live audiovisual communication technology and the disclosure of certain medical information by electronic transmission. I acknowledge that I have been given the opportunity to request an in-person assessment or other available alternative prior to the telemedicine visit and am voluntarily participating in the telemedicine visit.  I understand that I have the right to withhold or withdraw my consent to the use of telemedicine in the course of my care at any time, without affecting my right to future care  or treatment, and that the Practitioner or I may terminate the telemedicine visit at any time. I understand that I have the right to inspect all information obtained and/or recorded in the course of the telemedicine visit and may receive copies of available information for a reasonable fee.  I understand that some of the potential risks of receiving the Services via telemedicine include:   Delay or interruption in medical evaluation due to technological equipment failure or disruption;  Information transmitted may not be sufficient (e.g. poor resolution of images) to allow for appropriate medical decision making by the Practitioner; and/or   In rare instances, security protocols could fail, causing a breach of personal health information.  Furthermore, I acknowledge that it is my responsibility to provide information about my medical history, conditions and care that is complete and accurate to the best of my ability. I acknowledge that Practitioner's advice, recommendations, and/or decision may be based on factors not within their control, such as incomplete or inaccurate data provided by me or distortions of diagnostic images or specimens that may result from electronic transmissions.  I understand that the practice of medicine is not an exact science and that Practitioner makes no warranties or guarantees regarding treatment outcomes. I acknowledge that I will receive a copy of this consent concurrently upon execution via email to the email address I last provided but may also request a printed copy by calling the office of [facility].    I understand that my insurance will be billed for this visit.   I have read or had this consent read to me.  I understand the contents of this consent, which adequately explains the benefits and risks of the Services being provided via telemedicine.   I have been provided ample opportunity to ask questions regarding this consent and the Services and have had  my questions answered to my satisfaction.  I give my informed consent for the services to be provided through the use of telemedicine in my medical care  By participating in this telemedicine visit I agree to the above.      DPR on file. Spoke with pt daughter who states that the pt is not feeling well enough today to get ready for and present to office for an appt. She requests that 9/21 OV at 4:20 be converted to virtual visit if possible.  Reviewed virtual visit consent and discussed virtual visit process. 9/21 OV converted to virtual visit at 4:20pm.

## 2019-09-17 NOTE — Patient Instructions (Signed)

## 2019-09-17 NOTE — Telephone Encounter (Signed)
New message   Patient's daughter wants to know if can do a virtual visit today at 4:20 because the patient is not feeling today.  Please call.

## 2019-09-20 ENCOUNTER — Other Ambulatory Visit: Payer: Self-pay | Admitting: Cardiology

## 2019-10-19 ENCOUNTER — Other Ambulatory Visit: Payer: Self-pay | Admitting: Cardiology

## 2020-01-25 ENCOUNTER — Ambulatory Visit: Payer: Medicare Other

## 2020-02-02 ENCOUNTER — Ambulatory Visit: Payer: Medicare Other | Attending: Internal Medicine

## 2020-02-02 DIAGNOSIS — Z23 Encounter for immunization: Secondary | ICD-10-CM | POA: Insufficient documentation

## 2020-02-02 NOTE — Progress Notes (Signed)
   Covid-19 Vaccination Clinic  Name:  [name]    MRN: [id number] DOB: 09-Oct-1936  02/02/2020  [name] was observed post Covid-19 immunization for 15 minutes without incidence. She was provided with Vaccine Information Sheet and instruction to access the V-Safe system.   Ms. Couch was instructed to call 911 with any severe reactions post vaccine: Marland Kitchen Difficulty breathing  . Swelling of your face and throat  . A fast heartbeat  . A bad rash all over your body  . Dizziness and weakness    Immunizations Administered    Name Date Dose VIS Date Route   Pfizer COVID-19 Vaccine 02/02/2020  3:36 PM 0.3 mL 12/07/2019 Intramuscular   Manufacturer: [place]   Lot: [id number]   [place]: [id number]

## 2020-02-05 ENCOUNTER — Ambulatory Visit: Payer: Medicare Other

## 2020-02-21 ENCOUNTER — Other Ambulatory Visit: Payer: Self-pay | Admitting: Cardiology

## 2020-02-27 ENCOUNTER — Ambulatory Visit: Payer: Medicare Other | Attending: Internal Medicine

## 2020-02-27 DIAGNOSIS — Z23 Encounter for immunization: Secondary | ICD-10-CM | POA: Insufficient documentation

## 2020-02-27 NOTE — Progress Notes (Addendum)
   Covid-19 Vaccination Clinic  Name:  [name]    MRN: [id number] DOB: Mar 06, 1936  02/27/2020  [name] was observed post Covid-19 immunization for 30 minutes based on pre-vaccination screening without incident. She was provided with Vaccine Information Sheet and instruction to access the V-Safe system.   [name] was instructed to call 911 with any severe reactions post vaccine: Marland Kitchen Difficulty breathing  . Swelling of face and throat  . A fast heartbeat  . A bad rash all over body  . Dizziness and weakness   Immunizations Administered    Name Date Dose VIS Date Route   Pfizer COVID-19 Vaccine 02/27/2020 12:00 PM 0.3 mL 12/07/2019 Intramuscular   Manufacturer: [facility]   Lot: [id number]   [place]: [id number]

## 2020-04-21 ENCOUNTER — Other Ambulatory Visit: Payer: Self-pay | Admitting: Cardiology

## 2020-04-23 ENCOUNTER — Encounter (HOSPITAL_COMMUNITY): Payer: Self-pay | Admitting: Emergency Medicine

## 2020-04-23 ENCOUNTER — Other Ambulatory Visit: Payer: Self-pay

## 2020-04-23 ENCOUNTER — Emergency Department (HOSPITAL_COMMUNITY)
Admission: EM | Admit: 2020-04-23 | Discharge: 2020-04-24 | Disposition: A | Discharge status: Home / Self Care | Payer: Medicare Other | Attending: Emergency Medicine | Admitting: Emergency Medicine

## 2020-04-23 DIAGNOSIS — Z7984 Long term (current) use of oral hypoglycemic drugs: Secondary | ICD-10-CM | POA: Diagnosis not present

## 2020-04-23 DIAGNOSIS — I10 Essential (primary) hypertension: Secondary | ICD-10-CM

## 2020-04-23 DIAGNOSIS — E1122 Type 2 diabetes mellitus with diabetic chronic kidney disease: Secondary | ICD-10-CM | POA: Diagnosis not present

## 2020-04-23 DIAGNOSIS — I130 Hypertensive heart and chronic kidney disease with heart failure and stage 1 through stage 4 chronic kidney disease, or unspecified chronic kidney disease: Secondary | ICD-10-CM | POA: Insufficient documentation

## 2020-04-23 DIAGNOSIS — Z87891 Personal history of nicotine dependence: Secondary | ICD-10-CM | POA: Diagnosis not present

## 2020-04-23 DIAGNOSIS — Z7982 Long term (current) use of aspirin: Secondary | ICD-10-CM | POA: Diagnosis not present

## 2020-04-23 DIAGNOSIS — I2510 Atherosclerotic heart disease of native coronary artery without angina pectoris: Secondary | ICD-10-CM | POA: Diagnosis not present

## 2020-04-23 DIAGNOSIS — Z79899 Other long term (current) drug therapy: Secondary | ICD-10-CM | POA: Insufficient documentation

## 2020-04-23 DIAGNOSIS — I5032 Chronic diastolic (congestive) heart failure: Secondary | ICD-10-CM | POA: Insufficient documentation

## 2020-04-23 DIAGNOSIS — R519 Headache, unspecified: Secondary | ICD-10-CM

## 2020-04-23 DIAGNOSIS — F19939 Other psychoactive substance use, unspecified with withdrawal, unspecified: Secondary | ICD-10-CM | POA: Diagnosis not present

## 2020-04-23 DIAGNOSIS — N183 Chronic kidney disease, stage 3 unspecified: Secondary | ICD-10-CM | POA: Diagnosis not present

## 2020-04-23 NOTE — ED Triage Notes (Addendum)
Per EMS, pt from home, has had a headache X3 days.  Pt reports she was unable to get her "depression medication that I need to take care of my husband w/ dementia." She reports her daughter did get it for her and she took one today and now feels better.  No neuro deficits   213/83 80HR 99% RA 18RR 20G L hand

## 2020-04-23 NOTE — ED Notes (Signed)
[name] [id number] would like call when patient gets back in a room

## 2020-04-24 ENCOUNTER — Encounter (HOSPITAL_COMMUNITY): Payer: Self-pay | Admitting: Emergency Medicine

## 2020-04-24 NOTE — Discharge Instructions (Addendum)
Please make sure that Effexor is taken daily as prescribed Please have her calcium level rechecked with her doctor within the next week Return to the emergency department if your headache, weakness, numbness, or other worsening or change in symptoms

## 2020-04-24 NOTE — ED Provider Notes (Signed)
[facility] EMERGENCY DEPARTMENT Provider Note   CSN: [id number] Arrival date & time: 04/23/20  2023     History Chief Complaint  Patient presents with  . Headache  . Hypertension    [name] is a 84 y.o. female.  HPI  History obtained from patient and her daughter 84 year old female presents to the emergency department last night due to headache and high blood pressure.  She had not taken her Effexor for several days.  She did not realize that she could not abruptly start her Effexor.  She feels like her headache that began over the past couple days was due to this.  Her daughter did get her Effexor refilled and she took it yesterday.  She states her symptoms became better.  Her daughter states that her blood pressure was elevated at home and systolically was over [id number] this lasted for about an hour.  This is what precipitated her call to EMS.  The patient has had some ongoing left eye twitching and has a history of both stroke and states that she has some brainstem narrowing.  She reports that she had a work-up for her ears and her vision at [facility].  She has not had any changes of her blood pressure medication recently.  She states that she did have a headache yesterday but this has completely resolved.  She denies any changes in vision other than her chronic twitching.  She is not on any blood thinners.  She denies fever, cough, chest pain, abdominal pain, nausea, vomiting, diarrhea, or difficulty with gait Patient reports that she has had some neck pain that she was seen by her doctor for last week.  She reports that she was told to come back for 6 more things.  She describes as some sharp pain rating on the left side of her neck.  She denies any upper extremity weakness numbness or tingling.     Past Medical History:  Diagnosis Date  . Allergy   . Carotid artery occlusion   . Chronic diastolic CHF (congestive heart failure) ([place]) 12/2009  . Diabetes  mellitus without complication ([place])   . History of shingles 11-2008  . Hyperlipidemia   . Hypertension   . Kidney stones 2012  . Osteoarthritis of cervical spine   . Stroke [facility]) 12-2009    Patient Active Problem List   Diagnosis Date Noted  . Carotid artery occlusion   . CAD (coronary artery disease) 08/12/2016  . Hyperlipidemia 01/02/2016  . Essential hypertension 01/02/2016  . Aortic insufficiency 01/02/2016  . Dizziness 09/10/2015  . Carotid artery stenosis 10/03/2012  . CAROTID ARTERY DISEASE 02/19/2011  . CVA 03/02/2010  . DEEP VENOUS THROMBOPHLEBITIS, LEG, LEFT 03/02/2010  . DEGENERATIVE DISC DISEASE, CERVICAL SPINE 03/02/2010  . DEGENERATIVE DISC DISEASE, LUMBAR SPINE 03/02/2010  . SHINGLES 02/08/2010  . DISCITIS 02/08/2010  . Stroke ([place]) 12/2009  . Type II diabetes mellitus with renal manifestations ([place]) 12/2009  . Hyponatremia 12/2009  . Generalized weakness 12/2009  . Leukocytosis 12/2009  . Chronic diastolic CHF (congestive heart failure) ([place]) 12/2009  . GERD (gastroesophageal reflux disease) 12/2009  . CKD (chronic kidney disease), stage III 12/2009  . HLD (hyperlipidemia) 12/2009    Past Surgical History:  Procedure Laterality Date  . ABDOMINAL HYSTERECTOMY  1973  . ANKLE RECONSTRUCTION  1990   right - 5 screws & plate  . CHOLECYSTECTOMY  1988  . FOOT FUSION  2005   left - 4 screws  . GALLBLADDER  SURGERY  1988   large stone blockage  . [place]   left side  . [place]  . LUMBAR EPIDURAL INJECTION  May-Aug-Sept. 2015/ 08-29-15  . [place]   right  . spinal fusion surgery  2000   C4 and C5  . spinal fusion surgery  2003, 2010   C6 and C7  . SPINE SURGERY  2000   C4-C5 fusion  . SPINE SURGERY  2003   C6-C7 fusion  . SPINE SURGERY  2010   replace broken plate (cervical region)  . STOMACH SURGERY  2002   mass rt & left (not cancerous)     OB History   No obstetric history on file.     Family  History  Problem Relation Age of Onset  . Dementia Mother   . Heart attack Father     Social History   Tobacco Use  . Smoking status: Former Smoker    Packs/day: 1.00    Years: 3.00    Pack years: 3.00    Types: Cigarettes    Quit date: 02/08/1979    Years since quitting: 41.2  . Smokeless tobacco: Never Used  Substance Use Topics  . Alcohol use: No  . Drug use: No    Home Medications Prior to Admission medications   Medication Sig Start Date End Date Taking? Authorizing Provider  acetaminophen (TYLENOL) 500 MG tablet Take 500-1,000 mg by mouth every 6 (six) hours as needed for mild pain.   Yes [provider]  aspirin 81 MG tablet Take 81 mg by mouth daily. Per patient, takes one 81 mg daily   Yes [provider]  CALCIUM CITRATE PO Take 1 tablet by mouth daily.   Yes [provider]  carboxymethylcellulose (REFRESH PLUS) 0.5 % SOLN Place 1 drop into both eyes 3 (three) times daily as needed (dry eyes).    Yes [provider]  LORazepam (ATIVAN) 1 MG tablet Take 1 tablet (1 mg total) by mouth at bedtime. 09/13/19  Yes Domenic Polite, MD  losartan (COZAAR) 100 MG tablet Take 0.5 tablets (50 mg total) by mouth daily. 09/13/19  Yes Domenic Polite, MD  meloxicam (MOBIC) 7.5 MG tablet Take 7.5 mg by mouth daily.   Yes [provider]  Multiple Vitamin (MULTIVITAMIN) tablet Take 1 tablet by mouth daily.   Yes [provider]  Omega-3 Fatty Acids (FISH OIL) 1000 MG CPDR Take 1,000 mg by mouth daily.    Yes [provider]  omeprazole (PRILOSEC) 20 MG capsule Take 20 mg by mouth daily. 09/08/12  Yes [provider]  polyethylene glycol (MIRALAX / GLYCOLAX) packet Take 17 g by mouth daily as needed for mild constipation.    Yes [provider]  [place] 420 MG/3.5ML SOCT INJECT 420 MG INTO THE SKIN EVERY 30(THIRTY) DAYS Patient taking differently: Inject 420 mg as directed every 30 (thirty)  days.  04/21/20  Yes Lelon Perla, MD  venlafaxine XR (EFFEXOR-XR) 75 MG 24 hr capsule Take 75 mg by mouth daily. 04/15/20  Yes [provider]  vitamin B-12 (CYANOCOBALAMIN) 1000 MCG tablet Take 1,000 mcg by mouth daily.   Yes [provider]  VOLTAREN 1 % GEL Apply 2 g topically daily as needed (joint pain). For pain 09/04/12   [provider]    Allergies    Plavix [clopidogrel bisulfate], Prinivil [lisinopril], Sulfa antibiotics, Sulfasalazine, Codeine, Metronidazole, and Clopidogrel  Review of  Systems   Review of Systems  All other systems reviewed and are negative.   Physical Exam Updated Vital Signs BP (!) 152/77 (BP Location: Left Arm)   Pulse 83   Temp 98 F (36.7 C) (Oral)   Resp 16   Ht 1.473 m (4\' 10" )   Wt 59.9 kg   SpO2 98%   BMI 27.59 kg/m   Physical Exam Vitals and nursing note reviewed.  Constitutional:      General: She is not in acute distress.    Appearance: She is well-developed. She is not ill-appearing.  HENT:     Head: Normocephalic and atraumatic.     Mouth/Throat:     Mouth: Mucous membranes are moist.  Eyes:     General: No visual field deficit.    Extraocular Movements: Extraocular movements intact.     Pupils: Pupils are equal, round, and reactive to light.  Cardiovascular:     Rate and Rhythm: Normal rate and regular rhythm.     Heart sounds: Normal heart sounds.  Pulmonary:     Effort: Pulmonary effort is normal.     Breath sounds: Normal breath sounds.  Abdominal:     General: Bowel sounds are normal.     Palpations: Abdomen is soft.  Musculoskeletal:        General: Normal range of motion.     Cervical back: Normal range of motion and neck supple.  Skin:    General: Skin is warm and dry.  Neurological:     Mental Status: She is alert and oriented to person, place, and time.     Cranial Nerves: No cranial nerve deficit, dysarthria or facial asymmetry.     Sensory: No sensory deficit.     Motor: No  weakness.     Coordination: Coordination normal.     Deep Tendon Reflexes: Reflexes abnormal.  Psychiatric:        Mood and Affect: Mood normal.        Speech: Speech normal.        Behavior: Behavior normal.     ED Results / Procedures / Treatments   Labs (all labs ordered are listed, but only abnormal results are displayed) Labs Reviewed - No data to display  EKG None  Radiology No results found.  Procedures Procedures (including critical care time)  Medications Ordered in ED Medications - No data to display  ED Course  I have reviewed the triage vital signs and the nursing notes.  Pertinent labs & imaging results that were available during my care of the patient were reviewed by me and considered in my medical decision making (see chart for details).    MDM Rules/Calculators/A&P                     Patient presented complaining of headache and hypertension. Patient's blood pressure is now 150/70 Her headache has resolved I have reviewed her labs and CBC is normal. Creatinine is elevated but stable from prior She is mildly hypocalcemic with calcium of 8.6.  Do not plan any acute intervention for this and she will need to have this followed. I have discussed the above with the patient and with her daughter. Patient appears stable for discharge.Marland Kitchen  She will need to return if she has any return of her headache or change in her neurologic status.  She will need to continue her Effexor and all other medications and follow-up with her primary care doctor for recheck of her calcium level.  Final Clinical Impression(s) / ED Diagnoses Final diagnoses:  Hypertension, unspecified type  Nonintractable headache, unspecified chronicity pattern, unspecified headache type  Withdrawal from other psychoactive substance [facility])    Rx / DC Orders ED Discharge Orders    None       Pattricia Boss, MD 04/24/20 [phone]

## 2020-06-26 ENCOUNTER — Other Ambulatory Visit: Payer: Self-pay | Admitting: Family Medicine

## 2020-06-26 DIAGNOSIS — M5412 Radiculopathy, cervical region: Secondary | ICD-10-CM

## 2020-06-27 ENCOUNTER — Ambulatory Visit
Admission: RE | Admit: 2020-06-27 | Discharge: 2020-06-27 | Disposition: A | Discharge status: Home / Self Care | Payer: Medicare Other | Source: Ambulatory Visit | Attending: Family Medicine | Admitting: Family Medicine

## 2020-06-27 ENCOUNTER — Other Ambulatory Visit: Payer: Self-pay

## 2020-06-27 DIAGNOSIS — M5412 Radiculopathy, cervical region: Secondary | ICD-10-CM

## 2020-07-23 ENCOUNTER — Ambulatory Visit (INDEPENDENT_AMBULATORY_CARE_PROVIDER_SITE_OTHER): Payer: Medicare Other | Admitting: Physical Medicine and Rehabilitation

## 2020-07-23 ENCOUNTER — Other Ambulatory Visit: Payer: Self-pay

## 2020-07-23 ENCOUNTER — Encounter: Payer: Self-pay | Admitting: Physical Medicine and Rehabilitation

## 2020-07-23 VITALS — BP 148/77 | HR 76

## 2020-07-23 DIAGNOSIS — R519 Headache, unspecified: Secondary | ICD-10-CM | POA: Diagnosis not present

## 2020-07-23 DIAGNOSIS — R202 Paresthesia of skin: Secondary | ICD-10-CM

## 2020-07-23 DIAGNOSIS — M542 Cervicalgia: Secondary | ICD-10-CM

## 2020-07-23 DIAGNOSIS — I6523 Occlusion and stenosis of bilateral carotid arteries: Secondary | ICD-10-CM | POA: Diagnosis not present

## 2020-07-23 DIAGNOSIS — G8929 Other chronic pain: Secondary | ICD-10-CM

## 2020-07-23 DIAGNOSIS — M5481 Occipital neuralgia: Secondary | ICD-10-CM | POA: Diagnosis not present

## 2020-07-23 DIAGNOSIS — M25511 Pain in right shoulder: Secondary | ICD-10-CM

## 2020-07-23 NOTE — Progress Notes (Signed)
  Numeric Pain Rating Scale and Functional Assessment Average Pain 8   In the last MONTH (on 0-10 scale) has pain interfered with the following?  1. General activity like being  able to carry out your everyday physical activities such as walking, climbing stairs, carrying groceries, or moving a chair?  Rating(10)    

## 2020-07-24 ENCOUNTER — Encounter: Payer: Self-pay | Admitting: Physical Medicine and Rehabilitation

## 2020-07-24 ENCOUNTER — Ambulatory Visit: Payer: Self-pay

## 2020-07-24 ENCOUNTER — Ambulatory Visit (INDEPENDENT_AMBULATORY_CARE_PROVIDER_SITE_OTHER): Payer: Medicare Other | Admitting: Physical Medicine and Rehabilitation

## 2020-07-24 DIAGNOSIS — M25511 Pain in right shoulder: Secondary | ICD-10-CM | POA: Diagnosis not present

## 2020-07-24 DIAGNOSIS — G8929 Other chronic pain: Secondary | ICD-10-CM

## 2020-07-24 NOTE — Progress Notes (Signed)
   ISRAELLA HUBERT - 84 y.o. female MRN [id number]  Date of birth: August 08, 1936  Office Visit Note: Visit Date: 07/24/2020 PCP: Derinda Late, MD Referred by: Derinda Late, MD  Subjective: No chief complaint on file.  HPI:  RAMONA RUARK is a 84 y.o. female who comes in today at the request of Dr. Laurence Spates for planned Right anesthetic glrnohumeral arthrogram with fluoroscopic guidance.  The patient has failed conservative care including home exercise, medications, time and activity modification.  This injection will be diagnostic and hopefully therapeutic.  Please see requesting physician notes for further details and justification.  ROS Otherwise per HPI.  Assessment & Plan: Visit Diagnoses:  1. Chronic right shoulder pain     Plan: No additional findings.   Meds & Orders: No orders of the defined types were placed in this encounter.   Orders Placed This Encounter  Procedures  . Large Joint Inj: R glenohumeral  . XR C-ARM NO REPORT    Follow-up: Return if symptoms worsen or fail to improve.   Procedures: Large Joint Inj: R glenohumeral on 07/24/2020 3:33 PM Indications: pain and diagnostic evaluation Details: 22 G 3.5 in needle, fluoroscopy-guided anteromedial approach  Arthrogram: No  Medications: 3 mL bupivacaine 0.5 %; 60 mg triamcinolone acetonide 40 MG/ML Outcome: tolerated well, no immediate complications  There was excellent flow of contrast producing a partial arthrogram of the glenohumeral joint. The patient did have relief of symptoms during the anesthetic phase of the injection. Procedure, treatment alternatives, risks and benefits explained, specific risks discussed. Consent was given by the patient. Immediately prior to procedure a time out was called to verify the correct patient, procedure, equipment, support staff and site/side marked as required. Patient was prepped and draped in the usual sterile fashion.      No notes on file   Clinical  History: No specialty comments available.     Objective:  VS:  HT:    WT:   BMI:     BP:   HR: bpm  TEMP: ( )  RESP:  Physical Exam   Imaging: No results found.

## 2020-07-24 NOTE — Progress Notes (Signed)
Numeric Pain Rating Scale and Functional Assessment Average Pain 8   In the last MONTH (on 0-10 scale) has pain interfered with the following?  1. General activity like being  able to carry out your everyday physical activities such as walking, climbing stairs, carrying groceries, or moving a chair?  Rating(10)    +Driver, -BT, -Dye Allergies. 

## 2020-08-28 MED ORDER — TRIAMCINOLONE ACETONIDE 40 MG/ML IJ SUSP
60.0000 mg | INTRAMUSCULAR | Status: AC | PRN
  Administered 2020-07-24: 60 mg via INTRA_ARTICULAR

## 2020-08-28 MED ORDER — BUPIVACAINE HCL 0.5 % IJ SOLN
3.0000 mL | INTRAMUSCULAR | Status: AC | PRN
  Administered 2020-07-24: 3 mL via INTRA_ARTICULAR

## 2020-09-23 ENCOUNTER — Telehealth (INDEPENDENT_AMBULATORY_CARE_PROVIDER_SITE_OTHER): Payer: Medicare Other | Admitting: Cardiology

## 2020-09-23 ENCOUNTER — Encounter: Payer: Self-pay | Admitting: Cardiology

## 2020-09-23 DIAGNOSIS — I6523 Occlusion and stenosis of bilateral carotid arteries: Secondary | ICD-10-CM | POA: Diagnosis not present

## 2020-09-23 DIAGNOSIS — N1832 Chronic kidney disease, stage 3b: Secondary | ICD-10-CM | POA: Diagnosis not present

## 2020-09-23 DIAGNOSIS — I2510 Atherosclerotic heart disease of native coronary artery without angina pectoris: Secondary | ICD-10-CM | POA: Diagnosis not present

## 2020-09-23 DIAGNOSIS — Z8673 Personal history of transient ischemic attack (TIA), and cerebral infarction without residual deficits: Secondary | ICD-10-CM | POA: Insufficient documentation

## 2020-09-23 DIAGNOSIS — I2584 Coronary atherosclerosis due to calcified coronary lesion: Secondary | ICD-10-CM

## 2020-09-23 DIAGNOSIS — E871 Hypo-osmolality and hyponatremia: Secondary | ICD-10-CM

## 2020-09-23 DIAGNOSIS — I351 Nonrheumatic aortic (valve) insufficiency: Secondary | ICD-10-CM

## 2020-09-23 DIAGNOSIS — E7800 Pure hypercholesterolemia, unspecified: Secondary | ICD-10-CM

## 2020-09-23 DIAGNOSIS — I10 Essential (primary) hypertension: Secondary | ICD-10-CM

## 2020-09-23 NOTE — Patient Instructions (Signed)
Medication Instructions:  Continue current medications  *If you need a refill on your cardiac medications before your next appointment, please call your pharmacy*   Lab Work: None Ordered   Testing/Procedures: None Ordered   Follow-Up: At [facility], you and your health needs are our priority.  As part of our continuing mission to provide you with exceptional heart care, we have created designated Provider Care Teams.  These Care Teams include your primary Cardiologist (physician) and Advanced Practice Providers (APPs -  Physician Assistants and Nurse Practitioners) who all work together to provide you with the care you need, when you need it.  We recommend signing up for the patient portal called "MyChart".  Sign up information is provided on this After Visit Summary.  MyChart is used to connect with patients for Virtual Visits (Telemedicine).  Patients are able to view lab/test results, encounter notes, upcoming appointments, etc.  Non-urgent messages can be sent to your provider as well.   To learn more about what you can do with MyChart, go to [email or web address].    Your next appointment:   6 month(s)  The format for your next appointment:   In Person  Provider:   You may see Dr Stanford Breed or one of the following Advanced Practice Providers on your designated Care Team:    Kerin Ransom, PA-C  [place], [place]  Coletta Memos, [place]

## 2020-09-23 NOTE — Progress Notes (Signed)
Virtual Visit via Telephone Note   This visit type was conducted due to national recommendations for restrictions regarding the COVID-19 Pandemic (e.g. social distancing) in an effort to limit this patient's exposure and mitigate transmission in our community.  Due to her co-morbid illnesses, this patient is at least at moderate risk for complications without adequate follow up.  This format is felt to be most appropriate for this patient at this time.  The patient did not have access to video technology/had technical difficulties with video requiring transitioning to audio format only (telephone).  All issues noted in this document were discussed and addressed.  No physical exam could be performed with this format.  Please refer to the patient's chart for her  consent to telehealth for [facility]    Date:  09/23/2020   ID:  [name], DOB [birth year], MRN [id number] The patient was identified using 2 identifiers.  Patient Location: Home Provider Location: Office/Clinic  PCP:  Derinda Late, MD  Cardiologist:  Dr Stanford Breed Electrophysiologist:  None   Evaluation Performed:  Follow-Up Visit  Chief Complaint:  No cardiac complaints  History of Present Illness:    [name] is a pleasant 84 y.o. female with a history of incidental coronary disease documented on CT scan July 2017.  She has a history of hypertension, statin intolerance treated with Repatha, and a remote CVA by her history in 2010.  She has bilateral 1 to 39% carotid stenosis by Doppler January 2020.  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  She has been followed by vascular surgery for this in the past.  She was contacted today for a routine annual cardiac checkup.  Her daughter was present during the phone conversation.  In September 2020 she was admitted with weakness.  She was found to have hyponatremia.  This was felt to be secondary to diuretics and SSRIs.  She improved after these were  discontinued.  Since Dr. Stanford Breed saw her last in September 2020 she has been doing well from a cardiac standpoint.  She denies any chest pain or unusual shortness of breath.  She has had some issues with right-sided facial and head numbness.  She is due to see her neurologist about this.  She is noted to have high blood pressure, she is her systolic runs about 1 27-7 60.  The patient tells me that her PCP would prefer to handle this.  The patient does not have symptoms concerning for COVID-19 infection (fever, chills, cough, or new shortness of breath).    Past Medical History:  Diagnosis Date  . Allergy   . Carotid artery occlusion   . Chronic diastolic CHF (congestive heart failure) ([place]) 12/2009  . Diabetes mellitus without complication ([place])   . History of shingles 11-2008  . Hyperlipidemia   . Hypertension   . Kidney stones 2012  . Osteoarthritis of cervical spine   . Stroke [facility]) 12-2009   Past Surgical History:  Procedure Laterality Date  . ABDOMINAL HYSTERECTOMY  1973  . ANKLE RECONSTRUCTION  1990   right - 5 screws & plate  . CHOLECYSTECTOMY  1988  . FOOT FUSION  2005   left - 4 screws  . GALLBLADDER SURGERY  1988   large stone blockage  . [place]   left side  . [place]  . LUMBAR EPIDURAL INJECTION  May-Aug-Sept. 2015/ 08-29-15  . [place]   right  .  spinal fusion surgery  2000   C4 and C5  . spinal fusion surgery  2003, 2010   C6 and C7  . SPINE SURGERY  2000   C4-C5 fusion  . SPINE SURGERY  2003   C6-C7 fusion  . SPINE SURGERY  2010   replace broken plate (cervical region)  . STOMACH SURGERY  2002   mass rt & left (not cancerous)     Current Meds  Medication Sig  . acetaminophen (TYLENOL) 500 MG tablet Take 500-1,000 mg by mouth every 6 (six) hours as needed for mild pain.  Marland Kitchen aspirin 81 MG tablet Take 81 mg by mouth daily. Per patient, takes one 81 mg daily  . CALCIUM CITRATE PO Take 1 tablet by mouth daily.    . carboxymethylcellulose (REFRESH PLUS) 0.5 % SOLN Place 1 drop into both eyes 3 (three) times daily as needed (dry eyes).   . LORazepam (ATIVAN) 1 MG tablet Take 1 tablet (1 mg total) by mouth at bedtime.  Marland Kitchen losartan (COZAAR) 100 MG tablet Take 100 mg by mouth daily.  . meloxicam (MOBIC) 7.5 MG tablet Take 7.5 mg by mouth daily.  . Multiple Vitamin (MULTIVITAMIN) tablet Take 1 tablet by mouth daily.  . Omega-3 Fatty Acids (FISH OIL) 1000 MG CPDR Take 1,000 mg by mouth daily.   Marland Kitchen omeprazole (PRILOSEC) 20 MG capsule Take 20 mg by mouth daily.  . polyethylene glycol (MIRALAX / GLYCOLAX) packet Take 17 g by mouth daily as needed for mild constipation.   Marland Kitchen [place] 420 MG/3.5ML SOCT INJECT 420 MG INTO THE SKIN EVERY 30(THIRTY) DAYS (Patient taking differently: Inject 420 mg as directed every 30 (thirty) days. )  . venlafaxine XR (EFFEXOR-XR) 75 MG 24 hr capsule Take 75 mg by mouth daily.  . vitamin B-12 (CYANOCOBALAMIN) 1000 MCG tablet Take 1,000 mcg by mouth daily.  . VOLTAREN 1 % GEL Apply 2 g topically daily as needed (joint pain). For pain  . [DISCONTINUED] losartan (COZAAR) 100 MG tablet Take 0.5 tablets (50 mg total) by mouth daily. (Patient taking differently: Take 100 mg by mouth daily. )     Allergies:   Plavix [clopidogrel bisulfate], Prinivil [lisinopril], Sulfa antibiotics, Sulfasalazine, Codeine, Metronidazole, and Clopidogrel   Social History   Tobacco Use  . Smoking status: Former Smoker    Packs/day: 1.00    Years: 3.00    Pack years: 3.00    Types: Cigarettes    Quit date: 02/08/1979    Years since quitting: 41.6  . Smokeless tobacco: Never Used  Vaping Use  . Vaping Use: Never used  Substance Use Topics  . Alcohol use: No  . Drug use: No     Family Hx: The patient's family history includes Dementia in her mother; Heart attack in her father.  ROS:   Please see the history of present illness.    All other systems reviewed and are  negative.   Prior CV studies:   The following studies were reviewed today:  Echo 05/23/2018- Study Conclusions   - Left ventricle: The cavity size was normal. There was moderate  focal basal hypertrophy of the septum. Systolic function was  normal. The estimated ejection fraction was in the range of 60%  to 65%. Wall motion was normal; there were no regional wall  motion abnormalities. Doppler parameters are consistent with  abnormal left ventricular relaxation (grade 1 diastolic  dysfunction).  - Aortic valve: There was mild regurgitation.  - Mitral valve: There was mild  regurgitation.   Impressions:   - Normal LV systolic function; mild diastolic dysfunction; moderate  focal basal septal hypertrophy (LVOT not well interrogated for  gradient); mild AI; mild MR and TR.    Labs/Other Tests and Data Reviewed:    EKG:  An ECG dated 09/12/2019 was personally reviewed today and demonstrated:  NSR, HR 60  Recent Labs: No results found for requested labs within last 8760 hours.   Recent Lipid Panel Lab Results  Component Value Date/Time   CHOL 149 06/23/2016 09:27 AM   TRIG 122 06/23/2016 09:27 AM   HDL 65 06/23/2016 09:27 AM   CHOLHDL 2.3 06/23/2016 09:27 AM   LDLCALC 60 06/23/2016 09:27 AM    Wt Readings from Last 3 Encounters:  09/23/20 146 lb (66.2 kg)  04/24/20 132 lb (59.9 kg)  09/17/19 125 lb (56.7 kg)     Objective:    Vital Signs:  BP (!) 162/79   Pulse 78   Ht 4\' 10"  (1.473 m)   Wt 146 lb (66.2 kg)   BMI 30.51 kg/m    VITAL SIGNS:  reviewed  ASSESSMENT & PLAN:    HTN-will defer management to her PCP- goal B/P less than 373 systolic. Echo May '19 showed moderate LVH, normal LVF.   Carotid stenosis- Followed by vascular surgery.  Hyponatremia- improved after SSRI and diuretics were discontinued, her PCP follows her labs.  Mild AI-by echo may 2019  Plan: F/U in the office with Dr Stanford Breed in 6 months.  COVID-19 Education: The  signs and symptoms of COVID-19 were discussed with the patient and how to seek care for testing (follow up with PCP or arrange E-visit).  The importance of social distancing was discussed today.  Time:   Today, I have spent 15 minutes with the patient with telehealth technology discussing the above problems.     Medication Adjustments/Labs and Tests Ordered: Current medicines are reviewed at length with the patient today.  Concerns regarding medicines are outlined above.   Tests Ordered: No orders of the defined types were placed in this encounter.   Medication Changes: No orders of the defined types were placed in this encounter.   Follow Up:  In Person in 6 months with Dr Stanford Breed for exam and EKG  Signed, Kerin Ransom, PA-C  09/23/2020 1:31 PM    [facility]

## 2020-09-23 NOTE — Progress Notes (Signed)
[facility]    Provider:  Dr Jaynee Eagles Requesting Provider: Marshall Cork MD Primary Care Provider:  Derinda Late, MD  CC:  Occipital Neuralgia of the left side  HPI:  [name] is a 84 y.o. female here as requested by [facility] for multiple neurologic complaints as in the past ongoing for decades.  Past medical history stroke, hypertension, hyperlipidemia, diabetes, chronic diastolic heart failure, carotid artery occlusion, deep vein thrombosis, aortic insufficiency, coronary artery disease, degenerative disc disease of the cervical spine and the lumbar spine, chronic kidney disease, chronic dizziness for years, generalized weakness, hyperlipidemia, history of stroke  Pain started in the back of the head, needles and pins, (points to the emergence of the greater occipital nerve). She doesn't know when it is coming 1-2 minutes, pins and needles, numbness and shoots to the left half circle of the head. It is not severe. Random, happens at night when she lays on that side of the head pressure on it or sitting in her lounge chair. Not very severe per patient, she has had it for months. Not hurting right now. But daughter says when it hits her it is severe and nearly falls off her walker, daughter disagrees with the severity, she goes "woo". She denies any migrainous features, this is not her migraines. Patient denies it is severe but daughter says she told her she can;t live and patient states it isn't and she isn't sure why she says those things. She says it really doesn't.  Reviewed notes,  She haslabs and imaging from outside physicians, which showed:  I reviewed Dr. Romona Curls notes: She has numbness and tingling in the left side of the head, some intermittent left-sided neck pain, started 2 months prior to his appointment when he saw her on July 23, 2020, but I don;t see anymore details I have reached out to Dr. Ernestina Patches.   MRI cervical spine: personally reviewed  images and agree: Postsurgical changes and spondylosis of the cervical spine have not significantly changed as compared to cervical CT myelogram performed 01/15/2016. Findings are most notably as follows.  At C3-C4, vertebral osteophytosis and facet hypertrophy contribute to moderate central canal stenosis with slight flattening of the ventral cord. Bilateral neural foraminal narrowing (mild right, moderate left).  Additional sites of neural foraminal narrowing as described and greatest on the left at C5-C6 (severe at this site).  Redemonstrated C5-C6 pseudoarthrosis.  Review of prior notes:   We have seen patient for chronic, continuous, subjective dizziness and performed extensive evaluations much like multiple other physicians.  I reviewed Dr. Trilby Leaver notes who states that she has been to neuro-ophthalmology and other physicians who found no etiology, given the multitude of negative testing in the subjective nature of complaints he had nothing further to be done.  Patient has been to Dr. Sanda Klein at [facility] August 2019, she notes unsteadiness and wandering eyes, not evident on examination by ophthalmology, no nystagmus, Dr. Kathlen Mody stated maybe she could go to [facility] for neuro-ophthalmology.  She complained to Dr. Kathlen Mody about subjective visual disturbances, chronic headaches,, her eyes are wandering all over the place, is hard to see sometimes, hard to walk at times due to her wandering eyes, when asked if she has double vision she reports her vision is very distorted and blurry, she has a pinched nerve in her back she thinks at C5 and C6 and it leads up and "explodes in my head and pinches my eyes", she has had multiple imaging studies,  Dr. Kathlen Mody said "very hard to understand exactly what the main complaint is, pupils equally round and reactive to light, no APD, extraocular muscles are full, normal ocular adnexa, normal iris, no disc edema, normal nerve fiber layer analysis.   Patient is seen currently at orthopedics for occipital neuralgia.  Dr. Sandi Mariscal has recently performed an MRI of the cervical spine.  MRI of the brain completed August 2020 Dr. Sandi Mariscal as well as CT angio of the head and neck.  She has had sleep studies completed in February 2018.  She has had echocardiograms, 2019.Marland Kitchen   in fact she has been to Dr. Jolyn Nap at [facility] August 2019, she has been extensively evaluated by our office, Dr. Sandi Mariscal, neuro-ophthalmology at [facility] who found no etiology, multitude of negative testing    Review of Systems: Patient complains of symptoms per HPI as well as the following symptoms: headache, neck pain. Pertinent negatives and positives per HPI. All others negative.   Social History   Socioeconomic History   Marital status: Married    Spouse name: [name]    Number of children: 3   Years of education: 12   Highest education level: Not on file  Occupational History   Not on file  Tobacco Use   Smoking status: Former Smoker    Packs/day: 1.00    Years: 3.00    Pack years: 3.00    Types: Cigarettes    Quit date: 02/08/1979    Years since quitting: 41.6   Smokeless tobacco: Never Used  Vaping Use   Vaping Use: Never used  Substance and Sexual Activity   Alcohol use: No   Drug use: No   Sexual activity: Not on file  Other Topics Concern   Not on file  Social History Narrative   Lives at home with husband, [name].   Caffeine use: 1 cup coffee per day.   Social Determinants of Health   Financial Resource Strain:    Difficulty of Paying Living Expenses: Not on file  Food Insecurity:    Worried About Charity fundraiser in the Last Year: Not on file   [employer] of Food in the Last Year: Not on file  Transportation Needs:    Lack of Transportation (Medical): Not on file   Lack of Transportation (Non-Medical): Not on file  Physical Activity:    Days of Exercise per Week: Not on file   Minutes of Exercise per Session: Not  on file  Stress:    Feeling of Stress : Not on file  Social Connections:    Frequency of Communication with Friends and Family: Not on file   Frequency of Social Gatherings with Friends and Family: Not on file   Attends Religious Services: Not on file   Active Member of Clubs or Organizations: Not on file   Attends Archivist Meetings: Not on file   Marital Status: Not on file  Intimate Partner Violence:    Fear of Current or Ex-Partner: Not on file   Emotionally Abused: Not on file   Physically Abused: Not on file   Sexually Abused: Not on file    Family History  Problem Relation Age of Onset   Dementia Mother    Heart attack Father     Past Medical History:  Diagnosis Date   Allergy    Carotid artery occlusion    Chronic diastolic CHF (congestive heart failure) ([place]) 12/2009   Diabetes mellitus without complication ([place])    History of shingles  11-2008   Hyperlipidemia    Hypertension    Kidney stones 2012   Osteoarthritis of cervical spine    Stroke [facility]) 12-2009    Patient Active Problem List   Diagnosis Date Noted   History of stroke 09/23/2020   CAD (coronary artery disease) 08/12/2016   Hyperlipidemia 01/02/2016   Essential hypertension 01/02/2016   Aortic insufficiency 01/02/2016   Dizziness 09/10/2015   Carotid artery stenosis 10/03/2012   DEEP VENOUS THROMBOPHLEBITIS, LEG, LEFT 03/02/2010   DEGENERATIVE DISC DISEASE, CERVICAL SPINE 03/02/2010   DEGENERATIVE [place] DISEASE, LUMBAR SPINE 03/02/2010   SHINGLES 02/08/2010   DISCITIS 02/08/2010   Type II diabetes mellitus with renal manifestations ([place]) 12/2009   Hyponatremia 12/2009   Generalized weakness 12/2009   Leukocytosis 12/2009   Chronic diastolic CHF (congestive heart failure) ([place]) 12/2009   GERD (gastroesophageal reflux disease) 12/2009   CKD (chronic kidney disease), stage III 12/2009   HLD (hyperlipidemia) 12/2009    Past Surgical History:   Procedure Laterality Date   [place]   right - 5 screws & [name]   CHOLECYSTECTOMY  1988   FOOT FUSION  2005   left - 4 screws   GALLBLADDER SURGERY  1988   large stone blockage   HERNIA REPAIR  1963   left side   LUMBAR [place]   LUMBAR EPIDURAL INJECTION  May-Aug-Sept. 2015/ 08-29-15   ROTATOR CUFF REPAIR  1994   right   spinal fusion surgery  2000   C4 and C5   spinal fusion surgery  2003, 2010   C6 and C7   SPINE SURGERY  2000   C4-C5 fusion   SPINE SURGERY  2003   C6-C7 fusion   [place]  2010   replace broken [name] (cervical region)   STOMACH SURGERY  2002   mass rt & left (not cancerous)    Current Outpatient Medications  Medication Sig Dispense Refill   acetaminophen (TYLENOL) 500 MG tablet Take 500-1,000 mg by mouth every 6 (six) hours as needed for mild pain.     aspirin 81 MG tablet Take 81 mg by mouth daily. Per patient, takes one 81 mg daily     CALCIUM CITRATE PO Take 1 tablet by mouth daily.     carboxymethylcellulose (REFRESH PLUS) 0.5 % SOLN Place 1 drop into both eyes 3 (three) times daily as needed (dry eyes).      LORazepam (ATIVAN) 1 MG tablet Take 1 tablet (1 mg total) by mouth at bedtime. 30 tablet 0   losartan (COZAAR) 100 MG tablet Take 100 mg by mouth daily.     meloxicam (MOBIC) 7.5 MG tablet Take 7.5 mg by mouth daily.     Multiple Vitamin (MULTIVITAMIN) tablet Take 1 tablet by mouth daily.     Omega-3 Fatty Acids (FISH OIL) 1000 MG CPDR Take 1,000 mg by mouth daily.      omeprazole (PRILOSEC) 20 MG capsule Take 20 mg by mouth daily.     polyethylene glycol (MIRALAX / GLYCOLAX) packet Take 17 g by mouth daily as needed for mild constipation.      [place] 420 MG/3.5ML SOCT INJECT 420 MG INTO THE SKIN EVERY 30(THIRTY) DAYS (Patient taking differently: Inject 420 mg as directed every 30 (thirty) days. ) 12 mL 3   venlafaxine XR (EFFEXOR-XR)  75 MG 24 hr capsule Take 75 mg by mouth daily.     vitamin B-12 (CYANOCOBALAMIN) 1000  MCG tablet Take 1,000 mcg by mouth daily.     VOLTAREN 1 % GEL Apply 2 g topically daily as needed (joint pain). For pain     No current facility-administered medications for this visit.    Allergies as of 09/24/2020 - Review Complete 09/24/2020  Allergen Reaction Noted   Plavix [clopidogrel bisulfate] Other (See Comments) 02/09/2012   Prinivil [lisinopril] Swelling 02/09/2012   Sulfa antibiotics Hives 02/09/2012   Sulfasalazine Hives 02/09/2012   Codeine Nausea And Vomiting 09/11/2019   Metronidazole  01/07/2016   Clopidogrel Palpitations 08/19/2017    Vitals: BP (!) 156/78 (BP Location: Right Arm, Patient Position: Sitting)    Pulse 92    Ht 4\' 11"  (1.499 m)    Wt 141 lb (64 kg)    BMI 28.48 kg/m  Last Weight:  Wt Readings from Last 1 Encounters:  09/24/20 141 lb (64 kg)   Last Height:   Ht Readings from Last 1 Encounters:  09/24/20 4\' 11"  (1.499 m)     Physical exam: Exam: Gen: NAD, conversant, well nourised, obese, well groomed                     CV: RRR, no MRG. No Carotid Bruits. No peripheral edema, warm, nontender Eyes: Conjunctivae clear without exudates or hemorrhage  Neuro: Detailed Neurologic Exam  Speech:    Speech is normal; fluent and spontaneous with normal comprehension.  Cognition:    The patient is oriented to person, place, and time;     recent and remote memory intact;     language fluent;     normal attention, concentration,     fund of knowledge Cranial Nerves:    The pupils are equal, round, and reactive to light. The fundi are normal and spontaneous venous pulsations are present. Visual fields are full to finger confrontation. Extraocular movements are intact. Trigeminal sensation is intact and the muscles of mastication are normal. The face is symmetric. The palate elevates in the midline. Hearing intact. Voice is normal. Shoulder shrug is normal.  The tongue has normal motion without fasciculations.   Coordination:    Normal finger to nose and heel to shin. Normal rapid alternating movements.   Gait:    Walker, slow, shuffling  Motor Observation:    No asymmetry, no atrophy, and no involuntary movements noted. Tone:    Normal muscle tone.    Posture:    Posture is normal. normal erect    Strength: proximal weakness stable from exam 3 years ago     Sensation: intact to LT     Reflex Exam:  DTR's:    Deep tendon reflexes in the upper and lower extremities are brisk and symmetrical bilaterally.   Toes:    The toes are equiv bilaterally.   Clonus:    Clonus is absent.    Assessment/Plan:  84 y.o. female here as requested for occipital neuralgia/pain on the left side of the head.   - Patient reports pain consistent with occipital neuralgia on the right side of the head: Pain is right side and starts in the distribution of the occipital nerves, paroxysmal and brief(lasts about a minute), painful, tingling, with tenderness and trigger points at the emergence of the greater occipital nerve and radiation to the right side of the scalp.   - The occipital nerve has partial innervation from C3. Patient has C3-C4 facet hypertrophy, moderate central canal stenosis, bilateral neural foraminal narrowing so neuralgia could be originating here.  - She  does not have any pain today and denies severe pain.She says it is "not that painful" and vehemently denies it is painful "really,nothing painful". She doesn't know why she tells her daughter it is severe, doesn't know why she does that per patient but states its "not that bad".   - She had occipital nerve blocks and trigger point injections today will see how she does  Performed by Dr. Jaynee Eagles M.D. 3% marcaine and 3% lidocaine 38ml total each 30-gauge needle was used. All procedures a documented blood were medically necessary, reasonable and appropriate based on the patient's history, medical  diagnosis and physician opinion. Verbal informed consent was obtained from the patient, patient was informed of potential risk of procedure, including bruising, bleeding, hematoma formation, infection, muscle weakness, muscle pain, numbness, transient hypertension, transient hyperglycemia and transient insomnia among others. All areas injected were topically clean with isopropyl rubbing alcohol. Nonsterile nonlatex gloves were worn during the procedure.  1. Greater occipital nerve block [phone]). The greater occipital nerve site was identified at the nuchal line medial to the occipital artery. Medication was injected into the left occipital nerve areas and suboccipital areas. Patient's condition is associated with inflammation of the greater occipital nerve and associated multiple groups. Injection was deemed medically necessary, reasonable and appropriate. Injection represents a separate and unique surgical service.   Cc: Magnus Sinning, MD,  Derinda Late, MD  Sarina Ill, MD  [facility] [address] [place], [place] [address]  I spent more than 120 minutes of face-to-face and non-face-to-face time with patient on the  1. Cervico-occipital neuralgia of left side    diagnosis.  This included previsit chart review, lab review, study review, order entry, electronic health record documentation, patient education on the different diagnostic and therapeutic options, counseling and coordination of care, risks and benefits of management, compliance, or risk factor reduction. This does not include time spent on nerve blocks.  Phone [phone] Fax [phone]

## 2020-09-24 ENCOUNTER — Ambulatory Visit (INDEPENDENT_AMBULATORY_CARE_PROVIDER_SITE_OTHER): Payer: Medicare Other | Admitting: Neurology

## 2020-09-24 ENCOUNTER — Encounter: Payer: Self-pay | Admitting: Neurology

## 2020-09-24 ENCOUNTER — Other Ambulatory Visit: Payer: Self-pay

## 2020-09-24 VITALS — BP 156/78 | HR 92 | Ht 59.0 in | Wt 141.0 lb

## 2020-09-24 DIAGNOSIS — I6523 Occlusion and stenosis of bilateral carotid arteries: Secondary | ICD-10-CM

## 2020-09-24 DIAGNOSIS — M5481 Occipital neuralgia: Secondary | ICD-10-CM | POA: Diagnosis not present

## 2020-09-24 NOTE — Patient Instructions (Signed)
Performed occipital nerve blocks today for OCCIPTAL NEURALGIA       Occipital Neuralgia  Occipital neuralgia is a type of headache that causes brief episodes of very bad pain in the back of your head. Pain from occipital neuralgia may spread (radiate) to other parts of your head. These headaches may be caused by irritation of the nerves that leave your spinal cord high up in your neck, just below the base of your skull (occipital nerves). Your occipital nerves transmit sensations from the back of your head, the top of your head, and the areas behind your ears. What are the causes? This condition can occur without any known cause (primary headache syndrome). In other cases, this condition is caused by pressure on or irritation of one of the two occipital nerves. Pressure and irritation may be due to:  Muscle spasm in the neck.  Neck injury.  Wear and tear of the vertebrae in the neck (osteoarthritis).  Disease of the disks that separate the vertebrae.  Diabetes. What are the signs or symptoms? This condition causes brief burning, stabbing, electric, shocking, or shooting pain which can radiate to the top of the head. It can happen on one side or both sides of the head. It can also cause:  Pain behind the eye.  Pain triggered by neck movement or hair brushing.  Scalp tenderness, tingling, numbness, or burning  Aching in the back of the head between episodes of very bad pain.  Pain gets worse with exposure to bright lights. How is this diagnosed? There is no test that diagnoses this condition. Your health care provider may diagnose this condition based on a physical exam and your symptoms. Other tests may be done, such as:  Imaging studies of the brain and neck (cervical spine), such as an MRI or CT scan. These look for causes of pinched nerves.  Applying pressure to the nerves in the neck to try to re-create the pain.  Injection of numbing medicine into the occipital nerve  areas to see if pain goes away (diagnostic nerve block). How is this treated? Treatment for this condition may begin with simple measures, such as:  Rest.  Massage.  Applying heat or cold on the area.  Over-the-counter pain relievers. If these measures do not work, you may need other treatments, including:  Medicines, such as: ? Prescription-strength anti-inflammatory medicines. ? Muscle relaxants. ? Anti-seizure medicines, which can relieve pain. ? Antidepressants, which can relieve pain. ? Injected medicines, such as medicines that numb the area (local anesthetic) and steroids.  Pulsed radiofrequency ablation. This is when wires are implanted to deliver electrical impulses that block pain signals from the occipital nerve.  Surgery to relieve nerve pressure.  Physical therapy. Follow these instructions at home: Pain management      Avoid any activities that cause pain.  Rest when you have an attack of pain.  Try gentle massage to relieve pain.  Try a different pillow or sleeping position.  If directed, apply heat to the affected area as told by your health care provider. Use the heat source that your health care provider recommends, such as a moist heat pack or a heating pad. ? Place a towel between your skin and the heat source. ? Leave the heat on for 20-30 minutes. ? Remove the heat if your skin turns bright red. This is especially important if you are unable to feel pain, heat, or cold. You may have a greater risk of getting burned.  If directed, apply  ice to the back of the head and neck area as told by your health care provider. ? Put ice in a plastic bag. ? Place a towel between your skin and the bag. ? Leave the ice on for 20 minutes, 2-3 times per day. General instructions  Take over-the-counter and prescription medicines only as told by your health care provider.  Avoid things that make your symptoms worse, such as bright lights.  Try to stay active.  Get regular exercise that does not cause pain. Ask your health care provider to suggest safe exercises for you.  Work with a physical therapist to learn stretching exercises you can do at home.  Practice good posture.  Keep all follow-up visits as told by your health care provider. This is important. Contact a health care provider if:  Your medicine is not working.  You have new or worsening symptoms. Get help right away if:  You have very bad head pain that does not go away.  You have a sudden change in vision, balance, or speech. Summary  Occipital neuralgia is a type of headache that causes brief episodes of very bad pain in the back of your head.  Pain from occipital neuralgia may spread (radiate) to other parts of your head.  Treatment for this condition includes rest, massage, and medicines. This information is not intended to replace advice given to you by your health care provider. Make sure you discuss any questions you have with your health care provider. Document Revised: 11/29/2017 Document Reviewed: 02/17/2017 Elsevier Patient Education  [place].

## 2020-10-01 ENCOUNTER — Encounter: Payer: Self-pay | Admitting: Physical Medicine and Rehabilitation

## 2020-10-01 NOTE — Progress Notes (Signed)
[name] - 84 y.o. female MRN [id number]  Date of birth: 26-Mar-1936  Office Visit Note: Visit Date: 07/23/2020 PCP: Derinda Late, MD Referred by: Derinda Late, MD  Subjective: Chief Complaint  Patient presents with  . Head - Numbness, Pain  . Neck - Pain  . Right Shoulder - Pain  . Right Hand - Pain, Numbness   HPI: [name] is a 84 y.o. female who comes in today For new patient evaluation and management at the request of Dr. Derinda Late her primary care physician.  She is present with her daughter Weldon Inches whom we know quite well as we have treated her in the past and more recently we have been treating the patient's husband for his back.  The patient has a chronic complicated history of cervical spine pain with fusion.  In the early 2006 she underwent multiple spine surgeries with multiple level fusion.  In 2010 she underwent plate removal and revision.  She really has suffered from chronic neck and back pain for many years despite medication management and physical therapy etc.  Currently using meloxicam and Tylenol.  She really does not tolerate some of the medications.  Dr. Sandi Mariscal referred her mainly for neck and shoulder pain but the patient's complaints today are interesting.  She reports 8 out of 10 pain particularly with pain numbness and tingling in the left side of the head and more of an occipital nerve distribution.  No pain along the temples no blurry vision no other changes.  She has had a history of some headaches off and on but not like this.  This is been ongoing for about 2 months.  She would get very intermittent sharp shooting symptoms and then symptoms that will go away.  She has had no dizziness and again no vision changes.  No specific falls or trauma.  She has never been evaluated by a neurologist or headache specialist.  Fortunately she has follow-up with Dr. Sarina Ill in the past for an episode of significant dizziness and balance issues for  which she underwent extensive physical therapy and all those notes are reviewed.  Her biggest complaint is this left-sided occipital type pain with almost numbness.  She has some increase in the symptoms with rotation and extension but also forward flexion of the cervical spine.  Her second biggest complaint is right shoulder pain and this seems to be mechanical.  It is demonstrated by lack of range of motion with almost adhesive capsulitis type features where she has decreased range of motion on the right.  She does not relate the 2.  She has had remote rotator cuff repair on the right.  She does get some paresthesia into the hands at times but this is fleeting.  She denies any true radicular pain down the arms.  She rates her pain as an 8 out of 10 but really is functionally limiting at this point.  Dr. Sandi Mariscal did obtain MRI of the cervical spine and this is reviewed with the patient and her daughter today with spine images and models.  MRI shows pretty significant facet arthritis on the right at C2-3 with moderate narrowing of the canal at C3-4 despite the fact that she has fusion from C3-4 down to C7-T1.  Review of Systems  Musculoskeletal: Positive for joint pain and neck pain.  Neurological: Positive for tingling and headaches.  All other systems reviewed and are negative.  Otherwise per HPI.  Assessment & Plan: Visit Diagnoses:  1. Occipital neuralgia of left side   2. Intractable episodic headache, unspecified headache type   3. Paresthesia of skin   4. Cervicalgia   5. Chronic right shoulder pain     Plan: Findings:  1.  14-month episode of chronic worsening what seems like occipital neuralgia with differential diagnosis of myofascial trigger point referral pattern but as of note on MRI completed recently has right C2-3 arthritic change but her symptoms are on the left.  I think at this point the best approach because she seen Dr. Sarina Ill in the past although for a completely  different issue would be to follow-up with her and see if this was the correct diagnosis.  Obviously we could look at upper cervical facet joint blocks and radiofrequency ablation but would wait to see the evaluation from Dr. Jaynee Eagles.  I tried to explain the referral pattern was not directly related to her cervical spine and all of her cervical spine surgeries and the current MRI although it could be related to cervicogenic type headache pattern.  2.  In terms of her right shoulder this is very mechanical in nature.  She has pain with external rotation she lacks quite a bit of range of motion.  I will complete intra-articular glenohumeral joint injection in the next couple of days depending on schedule with fluoroscopic guidance.  We gave her exercises to do for range of motion of the shoulder.  Depending on relief will have her regroup with physical therapy and probably have her seen by Dr. Anderson Malta in the office from an orthopedic standpoint.  We did schedule injection to coincide when they are going to be here with her husband for spine procedure.  3.  Cervicalgia with history of multilevel cervical fusion with adjacent level disease at C2-3 but also moderate narrowing at C3-4.  Would consider if more radicular complaints cervical epidural injection below the fusion.  She does not have anything overtly surgical on the MRI and exam is fairly benign from a nerve standpoint.  We will monitor this.    Meds & Orders: No orders of the defined types were placed in this encounter.   Orders Placed This Encounter  Procedures  . Ambulatory referral to Neurology    Follow-up: Return for Right glenohumeral joint injection.   Procedures: No procedures performed  No notes on file   Clinical History: MRI CERVICAL SPINE WITHOUT CONTRAST  TECHNIQUE: Multiplanar, multisequence MR imaging of the cervical spine was performed. No intravenous contrast was administered.  COMPARISON:  Prior cervical CT  myelogram 01/15/2016  FINDINGS: Alignment: Mild fused grade 1 retrolisthesis at C3-C4. Mild fused grade 1 retrolisthesis at C5-C6. Mild fused grade 1 anterolisthesis at C6-C7 and C7-T1.  Vertebrae: Vertebral body height is maintained. Susceptibility artifact from ventral plates at the F8-H8 and C5-C6 levels. Solid fusion at the C3-C5 levels and C6-T1 levels.  Cord: No spinal cord signal abnormality.  Posterior Fossa, vertebral arteries, paraspinal tissues: Cerebellar atrophy. Flow voids preserved within the imaged cervical vertebral arteries. Paraspinal soft tissues within normal limits.  Disc levels:  Unless otherwise stated, the level by level findings below have not significantly changed since prior cervical CT myelogram 01/15/2016.  As before, there is advanced disc degeneration at T1-T2 and T2-T3.  C2-C3: No significant disc herniation. Facet arthrosis (advanced on the right). No significant spinal canal stenosis. Mild right neural foraminal narrowing.  C3-C4: Prior ACDF with fusion across the disc space. Vertebral osteophytosis. Facet hypertrophy. Moderate central canal stenosis with  slight flattening of the ventral cord. Bilateral neural foraminal narrowing (mild right, moderate left).  C4-C5: Fusion across the disc space. Vertebral osteophytosis. Facet hypertrophy. No significant spinal canal stenosis. Mild right neural foraminal narrowing.  C5-C6: Prior ACDF. Redemonstrated pseudoarthrosis. Vertebral osteophytosis. Facet hypertrophy. No significant spinal canal stenosis. Bilateral neural foraminal narrowing (mild right, severe left).  C6-C7: Fusion across the disc space. Vertebral osteophytosis. Facet hypertrophy. No significant spinal canal or foraminal stenosis.  C7-T1: Fusion across the disc space. Vertebral osteophytosis. Facet hypertrophy. No significant spinal canal or foraminal stenosis.  T1-T2: Posterior disc osteophyte complex with  bilateral disc osteophyte ridge. Facet hypertrophy. Mild relative spinal canal narrowing. Mild/moderate bilateral neural foraminal narrowing (greater on the right).  IMPRESSION: Postsurgical changes and spondylosis of the cervical spine have not significantly changed as compared to cervical CT myelogram performed 01/15/2016. Findings are most notably as follows.  At C3-C4, vertebral osteophytosis and facet hypertrophy contribute to moderate central canal stenosis with slight flattening of the ventral cord. Bilateral neural foraminal narrowing (mild right, moderate left).  Additional sites of neural foraminal narrowing as described and greatest on the left at C5-C6 (severe at this site).  Redemonstrated C5-C6 pseudoarthrosis.   Electronically Signed   By: Kellie Simmering DO   On: 06/28/2020 17:18   She reports that she quit smoking about 41 years ago. Her smoking use included cigarettes. She has a 3.00 pack-year smoking history. She has never used smokeless tobacco. No results for input(s): HGBA1C, LABURIC in the last 8760 hours.  Objective:  VS:  HT:    WT:   BMI:     BP:(!) 148/77  HR:76bpm  TEMP: ( )  RESP:  Physical Exam Vitals and nursing note reviewed.  Constitutional:      General: She is not in acute distress.    Appearance: Normal appearance. She is not ill-appearing.  HENT:     Head: Normocephalic and atraumatic.     Right Ear: External ear normal.     Left Ear: External ear normal.  Eyes:     Extraocular Movements: Extraocular movements intact.  Neck:     Comments: Decreased range of motion really in all planes. Cardiovascular:     Rate and Rhythm: Normal rate.     Pulses: Normal pulses.  Abdominal:     General: There is no distension.     Palpations: Abdomen is soft.  Musculoskeletal:        General: Tenderness present.     Cervical back: Neck supple. Tenderness present.     Right lower leg: No edema.     Left lower leg: No edema.     Comments:  Patient has good strength in the upper extremities including 5 out of 5 strength in wrist extension long finger flexion and APB.  There is no atrophy of the hands intrinsically.  There is a negative Hoffmann's test.  She does lack range of motion particularly with rotation left and right and extension of the cervical spine.  She has some posterior neck pain and posterior occipital pain with extension of the cervical spine.  She does have focal trigger points in the levator scapula paraspinal musculature and trapezius.  She has pretty significant impingement sign on the right of the right shoulder with decreased range of motion.  She really lacks the ability to abduct even to 90 degrees.   Lymphadenopathy:     Cervical: No cervical adenopathy.  Skin:    Findings: No erythema, lesion or rash.  Neurological:  General: No focal deficit present.     Mental Status: She is alert and oriented to person, place, and time.     Cranial Nerves: No cranial nerve deficit.     Sensory: No sensory deficit.     Motor: No weakness or abnormal muscle tone.     Coordination: Coordination normal.     Gait: Gait abnormal.     Comments: Cranial nerve exam is normal.  She does have some tenderness over the occipital nerve grooves with questionable Tinel's left more than right.  Psychiatric:        Mood and Affect: Mood normal.        Behavior: Behavior normal.     Ortho Exam  Imaging: No results found.  Past Medical/Family/Surgical/Social History: Medications & Allergies reviewed per EMR, new medications updated. Patient Active Problem List   Diagnosis Date Noted  . History of stroke 09/23/2020  . CAD (coronary artery disease) 08/12/2016  . Hyperlipidemia 01/02/2016  . Essential hypertension 01/02/2016  . Aortic insufficiency 01/02/2016  . Dizziness 09/10/2015  . Carotid artery stenosis 10/03/2012  . DEEP VENOUS THROMBOPHLEBITIS, LEG, LEFT 03/02/2010  . DEGENERATIVE DISC DISEASE, CERVICAL SPINE  03/02/2010  . DEGENERATIVE DISC DISEASE, LUMBAR SPINE 03/02/2010  . SHINGLES 02/08/2010  . DISCITIS 02/08/2010  . Type II diabetes mellitus with renal manifestations ([place]) 12/2009  . Hyponatremia 12/2009  . Generalized weakness 12/2009  . Leukocytosis 12/2009  . Chronic diastolic CHF (congestive heart failure) ([place]) 12/2009  . GERD (gastroesophageal reflux disease) 12/2009  . CKD (chronic kidney disease), stage III 12/2009  . HLD (hyperlipidemia) 12/2009   Past Medical History:  Diagnosis Date  . Allergy   . Carotid artery occlusion   . Chronic diastolic CHF (congestive heart failure) ([place]) 12/2009  . Diabetes mellitus without complication ([place])   . History of shingles 11-2008  . Hyperlipidemia   . Hypertension   . Kidney stones 2012  . Osteoarthritis of cervical spine   . Stroke [facility]) 12-2009   Family History  Problem Relation Age of Onset  . Dementia Mother   . Heart attack Father    Past Surgical History:  Procedure Laterality Date  . ABDOMINAL HYSTERECTOMY  1973  . ANKLE RECONSTRUCTION  1990   right - 5 screws & plate  . CHOLECYSTECTOMY  1988  . FOOT FUSION  2005   left - 4 screws  . GALLBLADDER SURGERY  1988   large stone blockage  . [place]   left side  . [place]  . LUMBAR EPIDURAL INJECTION  May-Aug-Sept. 2015/ 08-29-15  . [place]   right  . spinal fusion surgery  2000   C4 and C5  . spinal fusion surgery  2003, 2010   C6 and C7  . SPINE SURGERY  2000   C4-C5 fusion  . SPINE SURGERY  2003   C6-C7 fusion  . SPINE SURGERY  2010   replace broken plate (cervical region)  . STOMACH SURGERY  2002   mass rt & left (not cancerous)   Social History   Occupational History  . Not on file  Tobacco Use  . Smoking status: Former Smoker    Packs/day: 1.00    Years: 3.00    Pack years: 3.00    Types: Cigarettes    Quit date: 02/08/1979    Years since quitting: 41.6  . Smokeless tobacco: Never Used  Vaping Use    . Vaping Use: Never used  Substance and Sexual Activity  . Alcohol use: No  . Drug use: No  . Sexual activity: Not on file

## 2020-11-27 ENCOUNTER — Other Ambulatory Visit: Payer: Self-pay

## 2020-11-27 ENCOUNTER — Emergency Department (HOSPITAL_COMMUNITY): Payer: Medicare Other

## 2020-11-27 ENCOUNTER — Emergency Department (HOSPITAL_COMMUNITY)
Admission: EM | Admit: 2020-11-27 | Discharge: 2020-11-27 | Disposition: A | Discharge status: Home / Self Care | Payer: Medicare Other | Attending: Emergency Medicine | Admitting: Emergency Medicine

## 2020-11-27 DIAGNOSIS — S6992XA Unspecified injury of left wrist, hand and finger(s), initial encounter: Secondary | ICD-10-CM | POA: Diagnosis present

## 2020-11-27 DIAGNOSIS — I5032 Chronic diastolic (congestive) heart failure: Secondary | ICD-10-CM | POA: Insufficient documentation

## 2020-11-27 DIAGNOSIS — S0083XA Contusion of other part of head, initial encounter: Secondary | ICD-10-CM | POA: Diagnosis not present

## 2020-11-27 DIAGNOSIS — I2510 Atherosclerotic heart disease of native coronary artery without angina pectoris: Secondary | ICD-10-CM | POA: Insufficient documentation

## 2020-11-27 DIAGNOSIS — Z7982 Long term (current) use of aspirin: Secondary | ICD-10-CM | POA: Diagnosis not present

## 2020-11-27 DIAGNOSIS — W010XXA Fall on same level from slipping, tripping and stumbling without subsequent striking against object, initial encounter: Secondary | ICD-10-CM | POA: Diagnosis not present

## 2020-11-27 DIAGNOSIS — Z87891 Personal history of nicotine dependence: Secondary | ICD-10-CM | POA: Insufficient documentation

## 2020-11-27 DIAGNOSIS — E1122 Type 2 diabetes mellitus with diabetic chronic kidney disease: Secondary | ICD-10-CM | POA: Insufficient documentation

## 2020-11-27 DIAGNOSIS — Z8673 Personal history of transient ischemic attack (TIA), and cerebral infarction without residual deficits: Secondary | ICD-10-CM | POA: Diagnosis not present

## 2020-11-27 DIAGNOSIS — N183 Chronic kidney disease, stage 3 unspecified: Secondary | ICD-10-CM | POA: Diagnosis not present

## 2020-11-27 DIAGNOSIS — S52515A Nondisplaced fracture of left radial styloid process, initial encounter for closed fracture: Secondary | ICD-10-CM | POA: Insufficient documentation

## 2020-11-27 DIAGNOSIS — I130 Hypertensive heart and chronic kidney disease with heart failure and stage 1 through stage 4 chronic kidney disease, or unspecified chronic kidney disease: Secondary | ICD-10-CM | POA: Insufficient documentation

## 2020-11-27 DIAGNOSIS — W19XXXA Unspecified fall, initial encounter: Secondary | ICD-10-CM

## 2020-11-27 MED ORDER — ACETAMINOPHEN 325 MG PO TABS
650.0000 mg | ORAL_TABLET | Freq: Once | ORAL | Status: AC
  Administered 2020-11-27: 650 mg via ORAL
  Filled 2020-11-27: qty 2

## 2020-11-27 NOTE — ED Provider Notes (Signed)
[facility] EMERGENCY DEPARTMENT Provider Note   CSN: [id number] Arrival date & time: 11/27/20  1728     History Chief Complaint  Patient presents with  . Fall    Julia KUCHARSKI is a 84 y.o. female.  The history is provided by the patient and medical records.  Fall   [name] is a 84 y.o. female who presents to the Emergency Department complaining of fall. She presents the emergency department by EMS for evaluation of injuries following a mechanical fall that occurred one hour prior to ED arrival. She states that she was putting something in the trash when she tripped over a step and hit a gate and fell, striking her head. No loss of consciousness. She was unable to get up and had to call on the ground to yell for assistance. She complains of pain to her head that radiates down her neck as well as pain to bilateral hands in her left wrist. She denies any recent illnesses, takes no blood thinners. Symptoms are moderate to severe and constant nature.    Past Medical History:  Diagnosis Date  . Allergy   . Carotid artery occlusion   . Chronic diastolic CHF (congestive heart failure) ([place]) 12/2009  . Diabetes mellitus without complication ([place])   . History of shingles 11-2008  . Hyperlipidemia   . Hypertension   . Kidney stones 2012  . Osteoarthritis of cervical spine   . Stroke [facility]) 12-2009    Patient Active Problem List   Diagnosis Date Noted  . History of stroke 09/23/2020  . CAD (coronary artery disease) 08/12/2016  . Hyperlipidemia 01/02/2016  . Essential hypertension 01/02/2016  . Aortic insufficiency 01/02/2016  . Dizziness 09/10/2015  . Carotid artery stenosis 10/03/2012  . DEEP VENOUS THROMBOPHLEBITIS, LEG, LEFT 03/02/2010  . DEGENERATIVE DISC DISEASE, CERVICAL SPINE 03/02/2010  . DEGENERATIVE DISC DISEASE, LUMBAR SPINE 03/02/2010  . SHINGLES 02/08/2010  . DISCITIS 02/08/2010  . Type II diabetes mellitus with renal manifestations ([place])  12/2009  . Hyponatremia 12/2009  . Generalized weakness 12/2009  . Leukocytosis 12/2009  . Chronic diastolic CHF (congestive heart failure) ([place]) 12/2009  . GERD (gastroesophageal reflux disease) 12/2009  . CKD (chronic kidney disease), stage III 12/2009  . HLD (hyperlipidemia) 12/2009    Past Surgical History:  Procedure Laterality Date  . ABDOMINAL HYSTERECTOMY  1973  . ANKLE RECONSTRUCTION  1990   right - 5 screws & plate  . CHOLECYSTECTOMY  1988  . FOOT FUSION  2005   left - 4 screws  . GALLBLADDER SURGERY  1988   large stone blockage  . [place]   left side  . [place]  . LUMBAR EPIDURAL INJECTION  May-Aug-Sept. 2015/ 08-29-15  . [place]   right  . spinal fusion surgery  2000   C4 and C5  . spinal fusion surgery  2003, 2010   C6 and C7  . SPINE SURGERY  2000   C4-C5 fusion  . SPINE SURGERY  2003   C6-C7 fusion  . SPINE SURGERY  2010   replace broken plate (cervical region)  . STOMACH SURGERY  2002   mass rt & left (not cancerous)     OB History   No obstetric history on file.     Family History  Problem Relation Age of Onset  . Dementia Mother   . Heart attack Father     Social History   Tobacco  Use  . Smoking status: Former Smoker    Packs/day: 1.00    Years: 3.00    Pack years: 3.00    Types: Cigarettes    Quit date: 02/08/1979    Years since quitting: 41.8  . Smokeless tobacco: Never Used  Vaping Use  . Vaping Use: Never used  Substance Use Topics  . Alcohol use: No  . Drug use: No    Home Medications Prior to Admission medications   Medication Sig Start Date End Date Taking? Authorizing Provider  acetaminophen (TYLENOL) 500 MG tablet Take 500-1,000 mg by mouth every 6 (six) hours as needed for mild pain.    [provider]  aspirin 81 MG tablet Take 81 mg by mouth daily. Per patient, takes one 81 mg daily    [provider]  CALCIUM CITRATE PO Take 1 tablet by mouth daily.     [provider]  carboxymethylcellulose (REFRESH PLUS) 0.5 % SOLN Place 1 drop into both eyes 3 (three) times daily as needed (dry eyes).     [provider]  LORazepam (ATIVAN) 1 MG tablet Take 1 tablet (1 mg total) by mouth at bedtime. 09/13/19   Domenic Polite, MD  losartan (COZAAR) 100 MG tablet Take 100 mg by mouth daily.    [provider]  meloxicam (MOBIC) 7.5 MG tablet Take 7.5 mg by mouth daily.    [provider]  Multiple Vitamin (MULTIVITAMIN) tablet Take 1 tablet by mouth daily.    [provider]  Omega-3 Fatty Acids (FISH OIL) 1000 MG CPDR Take 1,000 mg by mouth daily.     [provider]  omeprazole (PRILOSEC) 20 MG capsule Take 20 mg by mouth daily. 09/08/12   [provider]  polyethylene glycol (MIRALAX / GLYCOLAX) packet Take 17 g by mouth daily as needed for mild constipation.     [provider]  Shindler 420 MG/3.5ML SOCT INJECT 420 MG INTO THE SKIN EVERY 30(THIRTY) DAYS Patient taking differently: Inject 420 mg as directed every 30 (thirty) days.  04/21/20   Lelon Perla, MD  venlafaxine XR (EFFEXOR-XR) 75 MG 24 hr capsule Take 75 mg by mouth daily. 04/15/20   [provider]  vitamin B-12 (CYANOCOBALAMIN) 1000 MCG tablet Take 1,000 mcg by mouth daily.    [provider]  VOLTAREN 1 % GEL Apply 2 g topically daily as needed (joint pain). For pain 09/04/12   [provider]    Allergies    Plavix [clopidogrel bisulfate], Prinivil [lisinopril], Sulfa antibiotics, Sulfasalazine, Codeine, Metronidazole, and Clopidogrel  Review of Systems   Review of Systems  All other systems reviewed and are negative.   Physical Exam Updated Vital Signs BP (!) 196/79   Pulse 70   Temp 98.2 F (36.8 C) (Oral)   Resp 18   Ht 5' (1.524 m)   Wt 63.5 kg   SpO2 97%   BMI 27.34 kg/m   Physical Exam Vitals and nursing note reviewed.  Constitutional:       Appearance: She is well-developed.  HENT:     Head: Normocephalic.     Comments: Large hematoma to the left temple Cardiovascular:     Rate and Rhythm: Normal rate and regular rhythm.     Heart sounds: No murmur heard.   Pulmonary:     Effort: Pulmonary effort is normal. No respiratory distress.     Breath sounds: Normal breath sounds.  Abdominal:     Palpations: Abdomen is  soft.     Tenderness: There is no abdominal tenderness. There is no guarding or rebound.  Musculoskeletal:     Comments: No hip tenderness to palpation. 2+ DP and radial pulses bilaterally. The right hand has a large amount of ecchymosis and swelling to the thenar eminence without significant tenderness. There is mild swelling, ecchymosis to the left thenar eminence with tenderness to palpation over the left wrist. There are chronic appearing deformities to bilateral first digits.  Skin:    General: Skin is warm and dry.  Neurological:     Mental Status: She is alert and oriented to person, place, and time.     Comments: Five out of five strength in all four extremities  Psychiatric:        Behavior: Behavior normal.     ED Results / Procedures / Treatments   Labs (all labs ordered are listed, but only abnormal results are displayed) Labs Reviewed - No data to display  EKG None  Radiology DG Wrist Complete Left  Result Date: 11/27/2020 CLINICAL DATA:  Left wrist pain after a fall.  Initial encounter. EXAM: LEFT WRIST - COMPLETE 3+ VIEW COMPARISON:  None. FINDINGS: Subtle lucency and trabecular disruption at the base of the radial styloid are worrisome for nondisplaced fracture. No other evidence of fracture is identified. Soft tissues about the wrist appear swollen. Advanced first [place] and scaphoid trapezium trapezoid joint osteoarthritis noted. Chondrocalcinosis of the TFC is seen. IMPRESSION: Findings worrisome for nondisplaced fracture at the base of the radial styloid. Soft tissue swelling is seen about the  wrist. Advanced first CMC and STT osteoarthritis. Electronically Signed   By: Inge Rise M.D.   On: 11/27/2020 19:11   CT Head Wo Contrast  Result Date: 11/27/2020 CLINICAL DATA:  84 year old female with trauma. EXAM: CT HEAD WITHOUT CONTRAST CT CERVICAL SPINE WITHOUT CONTRAST TECHNIQUE: Multidetector CT imaging of the head and cervical spine was performed following the standard protocol without intravenous contrast. Multiplanar CT image reconstructions of the cervical spine were also generated. COMPARISON:  Brain MRI dated 08/13/2019. FINDINGS: CT HEAD FINDINGS Brain: The ventricles and sulci appropriate size for patient's age. Mild periventricular chronic microvascular ischemic changes. There is no acute intracranial hemorrhage. No mass effect or midline shift. No extra-axial fluid collection. Vascular: No hyperdense vessel or unexpected calcification. Skull: Normal. Negative for fracture or focal lesion. Sinuses/Orbits: No acute finding. Other: Large left forehead hematoma. CT CERVICAL SPINE FINDINGS Alignment: No acute subluxation. Skull base and vertebrae: Advanced osteopenia with extensive postsurgical changes and fusion which limits evaluation for fracture. No definite acute fracture identified. Soft tissues and spinal canal: No prevertebral fluid or swelling. No visible canal hematoma. Disc levels: Multilevel fusion with C3-C4 and C5-C6 ACDF. Multilevel facet arthropathy. Upper chest: Mild emphysema. Other: Atherosclerotic calcification of the aortic arch as well as bilateral carotid bulb calcified plaques. IMPRESSION: 1. No acute intracranial pathology. 2. No acute/traumatic cervical spine pathology. Advanced osteopenia with extensive postsurgical changes and fusion. 3. Aortic Atherosclerosis (ICD10-I70.0). Electronically Signed   By: Anner Crete M.D.   On: 11/27/2020 19:45   CT Cervical Spine Wo Contrast  Result Date: 11/27/2020 CLINICAL DATA:  84 year old female with trauma. EXAM: CT  HEAD WITHOUT CONTRAST CT CERVICAL SPINE WITHOUT CONTRAST TECHNIQUE: Multidetector CT imaging of the head and cervical spine was performed following the standard protocol without intravenous contrast. Multiplanar CT image reconstructions of the cervical spine were also generated. COMPARISON:  Brain MRI dated 08/13/2019. FINDINGS: CT HEAD FINDINGS Brain: The ventricles and  sulci appropriate size for patient's age. Mild periventricular chronic microvascular ischemic changes. There is no acute intracranial hemorrhage. No mass effect or midline shift. No extra-axial fluid collection. Vascular: No hyperdense vessel or unexpected calcification. Skull: Normal. Negative for fracture or focal lesion. Sinuses/Orbits: No acute finding. Other: Large left forehead hematoma. CT CERVICAL SPINE FINDINGS Alignment: No acute subluxation. Skull base and vertebrae: Advanced osteopenia with extensive postsurgical changes and fusion which limits evaluation for fracture. No definite acute fracture identified. Soft tissues and spinal canal: No prevertebral fluid or swelling. No visible canal hematoma. Disc levels: Multilevel fusion with C3-C4 and C5-C6 ACDF. Multilevel facet arthropathy. Upper chest: Mild emphysema. Other: Atherosclerotic calcification of the aortic arch as well as bilateral carotid bulb calcified plaques. IMPRESSION: 1. No acute intracranial pathology. 2. No acute/traumatic cervical spine pathology. Advanced osteopenia with extensive postsurgical changes and fusion. 3. Aortic Atherosclerosis (ICD10-I70.0). Electronically Signed   By: Anner Crete M.D.   On: 11/27/2020 19:45   DG Hand Complete Left  Result Date: 11/27/2020 CLINICAL DATA:  Left hand pain after a fall.  Initial encounter. EXAM: LEFT HAND - COMPLETE 3+ VIEW COMPARISON:  None. FINDINGS: No acute bony or joint abnormality is identified. Scattered degenerative disease is worst at the first [facility] joint and scaphoid trapezium trapezoid joint. Soft tissues  demonstrate some chondrocalcinosis of the triangular fibrocartilage. IMPRESSION: No acute abnormality. Scattered osteoarthritis. Electronically Signed   By: Inge Rise M.D.   On: 11/27/2020 19:08   DG Hand Complete Right  Result Date: 11/27/2020 CLINICAL DATA:  Right hand pain after a fall.  Initial encounter. EXAM: RIGHT HAND - COMPLETE 3+ VIEW COMPARISON:  None. FINDINGS: No acute bony or joint abnormality is identified. The patient has multifocal osteoarthritis. Marked soft tissue swelling is seen along the first metacarpal and thumb. No radiopaque foreign body. IMPRESSION: Marked soft tissue swelling about the first metacarpal and thumb. No acute bony abnormality. Multifocal osteoarthritis. Electronically Signed   By: Inge Rise M.D.   On: 11/27/2020 19:09    Procedures Procedures (including critical care time)  Medications Ordered in ED Medications  acetaminophen (TYLENOL) tablet 650 mg (650 mg Oral Given 11/27/20 1857)    ED Course  I have reviewed the triage vital signs and the nursing notes.  Pertinent labs & imaging results that were available during my care of the patient were reviewed by me and considered in my medical decision making (see chart for details).    MDM Rules/Calculators/A&P                         patient here for evaluation of injuries following a mechanical fall. She has a large hematoma to her forehead, bruising to bilateral hands. CT head and C-spine are negative for acute fracture or intracranial abnormality. Left wrist film significant for radial styloid fracture. Discussed with patient findings of studies. Will place and splint with orthopedics follow-up. Discussed home care for contusions. Discussed outpatient follow-up and return precautions.  Final Clinical Impression(s) / ED Diagnoses Final diagnoses:  Fall, initial encounter  Contusion of face, initial encounter  Closed nondisplaced fracture of styloid process of left radius, initial  encounter    Rx / DC Orders ED Discharge Orders    None       Quintella Reichert, MD 11/27/20 2123

## 2020-11-27 NOTE — Progress Notes (Signed)
Orthopedic Tech Progress Note Patient Details:  GIULIANA HANDYSIDE March 12, 1936 [id number]  Ortho Devices Type of Ortho Device: Sugartong splint, Arm sling Ortho Device/Splint Location: LUE Ortho Device/Splint Interventions: Application, Adjustment   Post Interventions Patient Tolerated: Well Instructions Provided: Care of device, Adjustment of device   Hopie Pellegrin E Tomasita Beevers 11/27/2020, 8:40 PM

## 2020-11-27 NOTE — ED Notes (Addendum)
This RN heard the pt screaming, RN ran to room. Pt sitting up in bed screaming at this RN. Pt stating that "I have been here for hours! I want to go to [facility]! I want the doctor!". This RN got Dr. Ralene Bathe for pt.

## 2020-11-27 NOTE — ED Triage Notes (Signed)
Patient 2 steps, hit her head denies LOC Denies blood thinners  In c-collar  Obvious trauma to R hand Hematoma noted to anterior L forehead No bleeding  Denies any dizziness-- states she was leaning on door and it wasn't closed and she fell.   A0x4 at this time, follows all commands

## 2020-11-27 NOTE — ED Notes (Signed)
purewick placed

## 2020-11-27 NOTE — ED Notes (Signed)
DC instructions reviewed with patient and family and both verbalized understanding. vss and nad. Patient dc with wheelchair.

## 2020-11-27 NOTE — ED Notes (Signed)
Handoff given to [employer]

## 2021-03-24 ENCOUNTER — Other Ambulatory Visit: Payer: Self-pay | Admitting: Cardiology

## 2021-03-24 ENCOUNTER — Telehealth: Payer: Self-pay | Admitting: Cardiology

## 2021-03-24 NOTE — Telephone Encounter (Signed)
3.29.22 LM for [name] on daughter, [name]'s  Cell phone to schedule 1 yr fu w/Dr. Stanford Breed. LP

## 2021-04-13 ENCOUNTER — Ambulatory Visit (INDEPENDENT_AMBULATORY_CARE_PROVIDER_SITE_OTHER): Payer: Medicare Other | Admitting: Family Medicine

## 2021-04-13 ENCOUNTER — Other Ambulatory Visit: Payer: Self-pay

## 2021-04-13 ENCOUNTER — Ambulatory Visit: Payer: Self-pay

## 2021-04-13 DIAGNOSIS — G8929 Other chronic pain: Secondary | ICD-10-CM | POA: Diagnosis not present

## 2021-04-13 DIAGNOSIS — M25511 Pain in right shoulder: Secondary | ICD-10-CM

## 2021-04-13 NOTE — Progress Notes (Signed)
Office Visit Note   [name]           Date of Birth: 1936-10-03           MRN: [id number] Visit Date: 04/13/2021 Requested by: Derinda Late, MD [address] [place],  [place] [address] PCP: Derinda Late, MD  Subjective: Chief Complaint  Patient presents with  . Right Shoulder - Pain    Referred by Dr. Sandi Mariscal for a possible glenohumeral cortisone injection. Dr. Ernestina Patches had done one last July. It did help for some months.     HPI: She is here with right shoulder pain.  History of glenohumeral DJD.  Injection per Dr. Ernestina Patches a couple years ago gave good relief until a few months ago when she fell and sustained a concussion.  The shoulder has been hurting since then.              ROS:   All other systems were reviewed and are negative.  Objective: Vital Signs: There were no vitals taken for this visit.  Physical Exam:  General:  Alert and oriented, in no acute distress. Pulm:  Breathing unlabored. Psy:  Normal mood, congruent affect.  Right shoulder: She has limited active overhead reach and behind the back reach.  No significant crepitus today.  Tender in the anterior and posterior glenohumeral joint.  Imaging: US Guided Needle Placement  Result Date: 04/13/2021 Ultrasound guided injection is preferred based studies that show increased duration, increased effect, greater accuracy, decreased procedural pain, increased response rate, and decreased cost with ultrasound guided versus blind injection.   Verbal informed consent obtained.  Time-out conducted.  Noted no overlying erythema, induration, or other signs of local infection. Ultrasound-guided right glenohumeral injection: After sterile prep with Betadine, injected 4 cc 0.25% bupivocaine without epinephrine and 6 mg betamethasone using a 22-gauge spinal needle, passing the needle from posterior approach into the glenohumeral joint.  Injectate seen filling the joint capsule.    Assessment & Plan: 1.   Right shoulder glenohumeral DJD -Per patient request, shoulder was injected today under ultrasound guidance.  Follow-up as needed.     Procedures: No procedures performed        PMFS History: Patient Active Problem List   Diagnosis Date Noted  . History of stroke 09/23/2020  . CAD (coronary artery disease) 08/12/2016  . Hyperlipidemia 01/02/2016  . Essential hypertension 01/02/2016  . Aortic insufficiency 01/02/2016  . Dizziness 09/10/2015  . Carotid artery stenosis 10/03/2012  . DEEP VENOUS THROMBOPHLEBITIS, LEG, LEFT 03/02/2010  . DEGENERATIVE DISC DISEASE, CERVICAL SPINE 03/02/2010  . DEGENERATIVE DISC DISEASE, LUMBAR SPINE 03/02/2010  . SHINGLES 02/08/2010  . DISCITIS 02/08/2010  . Type II diabetes mellitus with renal manifestations ([place]) 12/2009  . Hyponatremia 12/2009  . Generalized weakness 12/2009  . Leukocytosis 12/2009  . Chronic diastolic CHF (congestive heart failure) ([place]) 12/2009  . GERD (gastroesophageal reflux disease) 12/2009  . CKD (chronic kidney disease), stage III 12/2009  . HLD (hyperlipidemia) 12/2009   Past Medical History:  Diagnosis Date  . Allergy   . Carotid artery occlusion   . Chronic diastolic CHF (congestive heart failure) ([place]) 12/2009  . Diabetes mellitus without complication ([place])   . History of shingles 11-2008  . Hyperlipidemia   . Hypertension   . Kidney stones 2012  . Osteoarthritis of cervical spine   . Stroke [facility]) 12-2009    Family History  Problem Relation Age of Onset  . Dementia Mother   . Heart attack Father  Past Surgical History:  Procedure Laterality Date  . ABDOMINAL HYSTERECTOMY  1973  . ANKLE RECONSTRUCTION  1990   right - 5 screws & plate  . CHOLECYSTECTOMY  1988  . FOOT FUSION  2005   left - 4 screws  . GALLBLADDER SURGERY  1988   large stone blockage  . [place]   left side  . [place]  . LUMBAR EPIDURAL INJECTION  May-Aug-Sept. 2015/ 08-29-15  . [place]   right  . spinal fusion surgery  2000   C4 and C5  . spinal fusion surgery  2003, 2010   C6 and C7  . SPINE SURGERY  2000   C4-C5 fusion  . SPINE SURGERY  2003   C6-C7 fusion  . SPINE SURGERY  2010   replace broken plate (cervical region)  . STOMACH SURGERY  2002   mass rt & left (not cancerous)   Social History   Occupational History  . Not on file  Tobacco Use  . Smoking status: Former Smoker    Packs/day: 1.00    Years: 3.00    Pack years: 3.00    Types: Cigarettes    Quit date: 02/08/1979    Years since quitting: 42.2  . Smokeless tobacco: Never Used  Vaping Use  . Vaping Use: Never used  Substance and Sexual Activity  . Alcohol use: No  . Drug use: No  . Sexual activity: Not on file

## 2021-04-29 ENCOUNTER — Telehealth: Payer: Self-pay | Admitting: Family Medicine

## 2021-04-29 NOTE — Telephone Encounter (Signed)
04/13/21 ov note emailed to Burgess Amor w/ Dr. Deboraha Sprang office. Their fax isn't working good. [email or web address]. ph [phone]

## 2021-05-15 ENCOUNTER — Telehealth: Payer: Self-pay | Admitting: Pharmacist

## 2021-05-15 NOTE — Telephone Encounter (Signed)
Fax received for [facility] replacement program.  Patient request replacement of Repatha 420mg  Pushtronix #1, no refill.  Authorization signed by DR Stanford Breed and faxed back to pharmacy.

## 2021-08-12 ENCOUNTER — Other Ambulatory Visit: Payer: Self-pay

## 2021-08-12 ENCOUNTER — Encounter: Payer: Self-pay | Admitting: Physical Medicine and Rehabilitation

## 2021-08-12 ENCOUNTER — Ambulatory Visit (INDEPENDENT_AMBULATORY_CARE_PROVIDER_SITE_OTHER): Payer: Medicare Other | Admitting: Physical Medicine and Rehabilitation

## 2021-08-12 ENCOUNTER — Ambulatory Visit: Payer: Self-pay

## 2021-08-12 DIAGNOSIS — M25511 Pain in right shoulder: Secondary | ICD-10-CM | POA: Diagnosis not present

## 2021-08-12 DIAGNOSIS — G8929 Other chronic pain: Secondary | ICD-10-CM | POA: Diagnosis not present

## 2021-08-12 NOTE — Progress Notes (Signed)
Pt state right shoulder pain. Pt state any movement with her arm and shoulder makes the pain worse. Pt state she uses heat and takes over the counter pain meds to help ease her pain.  Numeric Pain Rating Scale and Functional Assessment Average Pain 8   In the last MONTH (on 0-10 scale) has pain interfered with the following?  1. General activity like being  able to carry out your everyday physical activities such as walking, climbing stairs, carrying groceries, or moving a chair?  Rating(10)   -BT, -Dye Allergies.

## 2021-08-12 NOTE — Progress Notes (Signed)
[name] - 85 y.o. female MRN [id number]  Date of birth: 1936-03-04  Office Visit Note: Visit Date: 08/12/2021 PCP: Derinda Late, MD Referred by: Derinda Late, MD  Subjective: Chief Complaint  Patient presents with   Right Shoulder - Pain   HPI:  [name] is a 85 y.o. female who comes in today for planned repeat Right anesthetic glenohumeral joint arthrogram with fluoroscopic guidance.  The patient has failed conservative care including home exercise, medications, time and activity modification. Prior injection gave more than 50% relief for several months. This injection will be diagnostic and hopefully therapeutic.  Please see requesting physician notes for further details and justification.  Referring: Dr. Derinda Late   ROS Otherwise per HPI.  Assessment & Plan: Visit Diagnoses:    ICD-10-CM   1. Chronic right shoulder pain  M25.511 XR C-ARM NO REPORT   G89.29 Large Joint Inj: R glenohumeral      Plan: No additional findings.   Meds & Orders: No orders of the defined types were placed in this encounter.   Orders Placed This Encounter  Procedures   Large Joint Inj: R glenohumeral   XR C-ARM NO REPORT    Follow-up: No follow-ups on file.   Procedures: Large Joint Inj: R glenohumeral on 08/12/2021 1:41 PM Indications: pain and diagnostic evaluation Details: 22 G 3.5 in needle, fluoroscopy-guided anteromedial approach  Arthrogram: No  Medications: 3 mL bupivacaine 0.5 %; 60 mg triamcinolone acetonide 40 MG/ML Outcome: tolerated well, no immediate complications  There was excellent flow of contrast producing a partial arthrogram of the glenohumeral joint. The patient did have relief of symptoms during the anesthetic phase of the injection. Procedure, treatment alternatives, risks and benefits explained, specific risks discussed. Consent was given by the patient. Immediately prior to procedure a time out was called to verify the correct patient,  procedure, equipment, support staff and site/side marked as required. Patient was prepped and draped in the usual sterile fashion.         Clinical History: MRI CERVICAL SPINE WITHOUT CONTRAST   TECHNIQUE: Multiplanar, multisequence MR imaging of the cervical spine was performed. No intravenous contrast was administered.   COMPARISON:  Prior cervical CT myelogram 01/15/2016   FINDINGS: Alignment: Mild fused grade 1 retrolisthesis at C3-C4. Mild fused grade 1 retrolisthesis at C5-C6. Mild fused grade 1 anterolisthesis at C6-C7 and C7-T1.   Vertebrae: Vertebral body height is maintained. Susceptibility artifact from ventral plates at the [id number] and C5-C6 levels. Solid fusion at the C3-C5 levels and C6-T1 levels.   Cord: No spinal cord signal abnormality.   Posterior Fossa, vertebral arteries, paraspinal tissues: Cerebellar atrophy. Flow voids preserved within the imaged cervical vertebral arteries. Paraspinal soft tissues within normal limits.   Disc levels:   Unless otherwise stated, the level by level findings below have not significantly changed since prior cervical CT myelogram 01/15/2016.   As before, there is advanced disc degeneration at T1-T2 and T2-T3.   C2-C3: No significant disc herniation. Facet arthrosis (advanced on the right). No significant spinal canal stenosis. Mild right neural foraminal narrowing.   C3-C4: Prior ACDF with fusion across the disc space. Vertebral osteophytosis. Facet hypertrophy. Moderate central canal stenosis with slight flattening of the ventral cord. Bilateral neural foraminal narrowing (mild right, moderate left).   C4-C5: Fusion across the disc space. Vertebral osteophytosis. Facet hypertrophy. No significant spinal canal stenosis. Mild right neural foraminal narrowing.   C5-C6: Prior ACDF. Redemonstrated pseudoarthrosis. Vertebral osteophytosis. Facet hypertrophy. No significant spinal  canal stenosis. Bilateral neural foraminal  narrowing (mild right, severe left).   C6-C7: Fusion across the disc space. Vertebral osteophytosis. Facet hypertrophy. No significant spinal canal or foraminal stenosis.   C7-T1: Fusion across the disc space. Vertebral osteophytosis. Facet hypertrophy. No significant spinal canal or foraminal stenosis.   T1-T2: Posterior disc osteophyte complex with bilateral disc osteophyte ridge. Facet hypertrophy. Mild relative spinal canal narrowing. Mild/moderate bilateral neural foraminal narrowing (greater on the right).   IMPRESSION: Postsurgical changes and spondylosis of the cervical spine have not significantly changed as compared to cervical CT myelogram performed 01/15/2016. Findings are most notably as follows.   At C3-C4, vertebral osteophytosis and facet hypertrophy contribute to moderate central canal stenosis with slight flattening of the ventral cord. Bilateral neural foraminal narrowing (mild right, moderate left).   Additional sites of neural foraminal narrowing as described and greatest on the left at C5-C6 (severe at this site).   Redemonstrated C5-C6 pseudoarthrosis.     Electronically Signed   By: Kellie Simmering DO   On: 06/28/2020 17:18     Objective:  VS:  HT:    WT:   BMI:     BP:   HR: bpm  TEMP: ( )  RESP:  Physical Exam Musculoskeletal:     Comments: Painful range of motion of the right glenohumeral joint and shoulder.  Decreased range of motion with abduction forward flexion.     Imaging: No results found.

## 2021-09-01 MED ORDER — TRIAMCINOLONE ACETONIDE 40 MG/ML IJ SUSP
60.0000 mg | INTRAMUSCULAR | Status: AC | PRN
  Administered 2021-08-12: 60 mg via INTRA_ARTICULAR

## 2021-09-01 MED ORDER — BUPIVACAINE HCL 0.5 % IJ SOLN
3.0000 mL | INTRAMUSCULAR | Status: AC | PRN
  Administered 2021-08-12: 3 mL via INTRA_ARTICULAR

## 2022-01-14 ENCOUNTER — Other Ambulatory Visit: Payer: Self-pay | Admitting: Family Medicine

## 2022-01-14 DIAGNOSIS — R220 Localized swelling, mass and lump, head: Secondary | ICD-10-CM

## 2022-01-15 ENCOUNTER — Other Ambulatory Visit: Payer: Self-pay

## 2022-01-15 ENCOUNTER — Other Ambulatory Visit: Payer: Self-pay | Admitting: Family Medicine

## 2022-01-15 ENCOUNTER — Ambulatory Visit
Admission: RE | Admit: 2022-01-15 | Discharge: 2022-01-15 | Disposition: A | Discharge status: Home / Self Care | Payer: Medicare Other | Source: Ambulatory Visit | Attending: Family Medicine | Admitting: Family Medicine

## 2022-01-15 DIAGNOSIS — R220 Localized swelling, mass and lump, head: Secondary | ICD-10-CM

## 2022-01-15 DIAGNOSIS — I69392 Facial weakness following cerebral infarction: Secondary | ICD-10-CM

## 2022-01-25 ENCOUNTER — Other Ambulatory Visit: Payer: Self-pay

## 2022-01-25 ENCOUNTER — Ambulatory Visit
Admission: RE | Admit: 2022-01-25 | Discharge: 2022-01-25 | Disposition: A | Discharge status: Home / Self Care | Payer: Medicare Other | Source: Ambulatory Visit | Attending: Family Medicine | Admitting: Family Medicine

## 2022-01-25 DIAGNOSIS — I69392 Facial weakness following cerebral infarction: Secondary | ICD-10-CM

## 2022-01-25 MED ORDER — GADOBENATE DIMEGLUMINE 529 MG/ML IV SOLN
13.0000 mL | Freq: Once | INTRAVENOUS | Status: AC | PRN
  Administered 2022-01-25: 13 mL via INTRAVENOUS

## 2022-01-26 ENCOUNTER — Ambulatory Visit (HOSPITAL_COMMUNITY)
Admission: RE | Admit: 2022-01-26 | Discharge: 2022-01-26 | Disposition: A | Discharge status: Home / Self Care | Payer: Medicare Other | Source: Ambulatory Visit | Attending: Family Medicine | Admitting: Family Medicine

## 2022-01-26 ENCOUNTER — Other Ambulatory Visit (HOSPITAL_COMMUNITY): Payer: Self-pay | Admitting: Family Medicine

## 2022-01-26 DIAGNOSIS — I6523 Occlusion and stenosis of bilateral carotid arteries: Secondary | ICD-10-CM | POA: Insufficient documentation

## 2022-03-30 ENCOUNTER — Other Ambulatory Visit: Payer: Self-pay | Admitting: Family Medicine

## 2022-03-30 ENCOUNTER — Ambulatory Visit
Admission: RE | Admit: 2022-03-30 | Discharge: 2022-03-30 | Disposition: A | Discharge status: Home / Self Care | Payer: Medicare Other | Source: Ambulatory Visit | Attending: Family Medicine | Admitting: Family Medicine

## 2022-03-30 DIAGNOSIS — M25511 Pain in right shoulder: Secondary | ICD-10-CM

## 2022-04-06 ENCOUNTER — Other Ambulatory Visit: Payer: Self-pay | Admitting: Cardiology

## 2022-04-16 ENCOUNTER — Other Ambulatory Visit: Payer: Self-pay | Admitting: Cardiology

## 2022-04-16 ENCOUNTER — Other Ambulatory Visit: Payer: Self-pay | Admitting: *Deleted

## 2022-04-16 MED ORDER — REPATHA PUSHTRONEX SYSTEM 420 MG/3.5ML ~~LOC~~ SOCT: SUBCUTANEOUS | 11 refills | Status: DC

## 2022-08-20 ENCOUNTER — Encounter (HOSPITAL_COMMUNITY): Payer: Self-pay

## 2022-08-20 ENCOUNTER — Emergency Department (HOSPITAL_COMMUNITY): Payer: Medicare Other

## 2022-08-20 ENCOUNTER — Emergency Department (HOSPITAL_COMMUNITY)
Admission: EM | Admit: 2022-08-20 | Discharge: 2022-08-20 | Disposition: A | Discharge status: Home / Self Care | Payer: Medicare Other | Attending: Emergency Medicine | Admitting: Emergency Medicine

## 2022-08-20 ENCOUNTER — Other Ambulatory Visit: Payer: Self-pay

## 2022-08-20 DIAGNOSIS — Z79899 Other long term (current) drug therapy: Secondary | ICD-10-CM | POA: Insufficient documentation

## 2022-08-20 DIAGNOSIS — R519 Headache, unspecified: Secondary | ICD-10-CM

## 2022-08-20 DIAGNOSIS — Z20822 Contact with and (suspected) exposure to covid-19: Secondary | ICD-10-CM | POA: Insufficient documentation

## 2022-08-20 DIAGNOSIS — R29810 Facial weakness: Secondary | ICD-10-CM | POA: Diagnosis not present

## 2022-08-20 DIAGNOSIS — H539 Unspecified visual disturbance: Secondary | ICD-10-CM

## 2022-08-20 DIAGNOSIS — I2510 Atherosclerotic heart disease of native coronary artery without angina pectoris: Secondary | ICD-10-CM | POA: Insufficient documentation

## 2022-08-20 DIAGNOSIS — I639 Cerebral infarction, unspecified: Secondary | ICD-10-CM | POA: Insufficient documentation

## 2022-08-20 DIAGNOSIS — Z7982 Long term (current) use of aspirin: Secondary | ICD-10-CM | POA: Insufficient documentation

## 2022-08-20 DIAGNOSIS — R4789 Other speech disturbances: Secondary | ICD-10-CM | POA: Insufficient documentation

## 2022-08-20 DIAGNOSIS — H538 Other visual disturbances: Secondary | ICD-10-CM | POA: Diagnosis not present

## 2022-08-20 DIAGNOSIS — R479 Unspecified speech disturbances: Secondary | ICD-10-CM

## 2022-08-20 DIAGNOSIS — I509 Heart failure, unspecified: Secondary | ICD-10-CM | POA: Insufficient documentation

## 2022-08-20 DIAGNOSIS — R29818 Other symptoms and signs involving the nervous system: Secondary | ICD-10-CM

## 2022-08-20 DIAGNOSIS — R531 Weakness: Secondary | ICD-10-CM | POA: Diagnosis not present

## 2022-08-20 DIAGNOSIS — R471 Dysarthria and anarthria: Secondary | ICD-10-CM | POA: Diagnosis not present

## 2022-08-20 LAB — CBC
HCT: 41.4 % (ref 36.0–46.0)
Hemoglobin: 13.4 g/dL (ref 12.0–15.0)
MCH: 32.7 pg (ref 26.0–34.0)
MCHC: 32.4 g/dL (ref 30.0–36.0)
MCV: 101 fL — ABNORMAL HIGH (ref 80.0–100.0)
Platelets: 224 10*3/uL (ref 150–400)
RBC: 4.1 MIL/uL (ref 3.87–5.11)
RDW: 12 % (ref 11.5–15.5)
WBC: 8.3 10*3/uL (ref 4.0–10.5)
nRBC: 0 % (ref 0.0–0.2)

## 2022-08-20 LAB — COMPREHENSIVE METABOLIC PANEL
ALT: 16 U/L (ref 0–44)
AST: 28 U/L (ref 15–41)
Albumin: 3.7 g/dL (ref 3.5–5.0)
Alkaline Phosphatase: 56 U/L (ref 38–126)
Anion gap: 11 (ref 5–15)
BUN: 22 mg/dL (ref 8–23)
CO2: 24 mmol/L (ref 22–32)
Calcium: 9.1 mg/dL (ref 8.9–10.3)
Chloride: 102 mmol/L (ref 98–111)
Creatinine, Ser: 1.53 mg/dL — ABNORMAL HIGH (ref 0.44–1.00)
GFR, Estimated: 33 mL/min — ABNORMAL LOW (ref >60)
Glucose, Bld: 164 mg/dL — ABNORMAL HIGH (ref 70–99)
Potassium: 4.8 mmol/L (ref 3.5–5.1)
Sodium: 137 mmol/L (ref 135–145)
Total Bilirubin: 0.3 mg/dL (ref 0.3–1.2)
Total Protein: 6.3 g/dL — ABNORMAL LOW (ref 6.5–8.1)

## 2022-08-20 LAB — I-STAT CHEM 8, ED
BUN: 26 mg/dL — ABNORMAL HIGH (ref 8–23)
Calcium, Ion: 1.05 mmol/L — ABNORMAL LOW (ref 1.15–1.40)
Chloride: 102 mmol/L (ref 98–111)
Creatinine, Ser: 1.4 mg/dL — ABNORMAL HIGH (ref 0.44–1.00)
Glucose, Bld: 159 mg/dL — ABNORMAL HIGH (ref 70–99)
HCT: 39 % (ref 36.0–46.0)
Hemoglobin: 13.3 g/dL (ref 12.0–15.0)
Potassium: 4.5 mmol/L (ref 3.5–5.1)
Sodium: 136 mmol/L (ref 135–145)
TCO2: 25 mmol/L (ref 22–32)

## 2022-08-20 LAB — CBG MONITORING, ED: Glucose-Capillary: 166 mg/dL — ABNORMAL HIGH (ref 70–99)

## 2022-08-20 LAB — ETHANOL: Alcohol, Ethyl (B): <10 mg/dL (ref <10)

## 2022-08-20 LAB — DIFFERENTIAL
Abs Immature Granulocytes: 0.03 10*3/uL (ref 0.00–0.07)
Basophils Absolute: 0.1 10*3/uL (ref 0.0–0.1)
Basophils Relative: 1 %
Eosinophils Absolute: 0.2 10*3/uL (ref 0.0–0.5)
Eosinophils Relative: 3 %
Immature Granulocytes: 0 %
Lymphocytes Relative: 26 %
Lymphs Abs: 2.1 10*3/uL (ref 0.7–4.0)
Monocytes Absolute: 0.5 10*3/uL (ref 0.1–1.0)
Monocytes Relative: 6 %
Neutro Abs: 5.4 10*3/uL (ref 1.7–7.7)
Neutrophils Relative %: 64 %

## 2022-08-20 LAB — RESP PANEL BY RT-PCR (FLU A&B, COVID) ARPGX2
Influenza A by PCR: NEGATIVE
Influenza B by PCR: NEGATIVE
SARS Coronavirus 2 by RT PCR: NEGATIVE

## 2022-08-20 LAB — PROTIME-INR
INR: 0.9 (ref 0.8–1.2)
Prothrombin Time: 12.5 seconds (ref 11.4–15.2)

## 2022-08-20 LAB — APTT: aPTT: 27 seconds (ref 24–36)

## 2022-08-20 MED ORDER — GADOBUTROL 1 MMOL/ML IV SOLN
6.5000 mL | Freq: Once | INTRAVENOUS | Status: AC | PRN
Start: 2022-08-20 — End: 2022-08-20
  Administered 2022-08-20: 6.5 mL via INTRAVENOUS

## 2022-08-20 NOTE — ED Notes (Signed)
Patient transported to MRI at approximately 1415. MRI reports patient has ~20 mins left in scan and will return to room. NIH will be completed at that time.

## 2022-08-20 NOTE — ED Triage Notes (Signed)
Pt BIB [facility] from home after daughter called for sudden onset of dysarthria, L-sided facial droop, weakness and headache at 1208. LKW 1030. Patient A&Ox4 at this time VSS. 18 L forearm

## 2022-08-20 NOTE — Consult Note (Signed)
Neurology Consultation  Reason for Consult: Code stroke for headache and left-sided weakness Referring physician: Dr. Gustavus Messing CC: Left-sided headache, left-sided weakness  History is obtained from:, Chart  HPI: [name] is a 86 y.o. female past medical history of chronic diastolic CHF, diabetes, hyperlipidemia, hypertension, prior stroke with no residual deficits, headaches-presenting with sudden onset of left-sided head pressure with sudden onset and left-sided arm and leg weakness.  She reports that she was in her usual state of health last normal at 10:30 AM and somewhere around 12 noon is when the family noticed that her speech was garbled.  She was having difficult time bringing her words out.  She complained of pressure pain on her left side of her head and weakness of the left side of the body.  Speech has improved.  Pressure sensation has become somewhat less but she still feels weak on the left side.  She has not had headache in a while but has had occipital neuralgia in the past.  Seen Dr. Lavell Anchors at [facility] neurology in the past. On no migraine medications at this time Denies any chest pain shortness of breath.  Denies any visual symptoms.  Reports having had migraines all her life but no migraines over the past year or so.  This past week has had 2 migraines.  In the past, has had occipital neuralgia for which she got nerve blocks with Dr. Lavell Anchors in 2021.  Currently on no prophylactic treatment for headaches   LKW: 10:30 AM IV thrombolysis given?: no, like complex migraine, rapidly resolving symptoms Premorbid modified Rankin scale (mRS): 0   ROS: Full ROS was performed and is negative except as noted in the HPI.   Past Medical History:  Diagnosis Date   Allergy    Carotid artery occlusion    Chronic diastolic CHF (congestive heart failure) ([place]) 12/2009   Diabetes mellitus without complication ([place])    History of shingles 11-2008   Hyperlipidemia    Hypertension     Kidney stones 2012   Osteoarthritis of cervical spine    Stroke [facility]) 12-2009     Family History  Problem Relation Age of Onset   Dementia Mother    Heart attack Father      Social History:   reports that she quit smoking about 43 years ago. Her smoking use included cigarettes. She has a 3.00 pack-year smoking history. She has never used smokeless tobacco. She reports that she does not drink alcohol and does not use drugs.  Medications No current facility-administered medications for this encounter.  Current Outpatient Medications:    acetaminophen (TYLENOL) 500 MG tablet, Take 500-1,000 mg by mouth every 6 (six) hours as needed for mild pain., Disp: , Rfl:    aspirin 81 MG tablet, Take 81 mg by mouth daily. Per patient, takes one 81 mg daily, Disp: , Rfl:    CALCIUM CITRATE PO, Take 1 tablet by mouth daily., Disp: , Rfl:    carboxymethylcellulose (REFRESH PLUS) 0.5 % SOLN, Place 1 drop into both eyes 3 (three) times daily as needed (dry eyes). , Disp: , Rfl:    Evolocumab with Infusor ([place]) 420 MG/3.5ML SOCT, INJECT 420 MG INTO THE SKIN EVERY 30 (THIRTY) DAYS, Disp: 3.5 mL, Rfl: 11   LORazepam (ATIVAN) 1 MG tablet, Take 1 tablet (1 mg total) by mouth at bedtime., Disp: 30 tablet, Rfl: 0   losartan (COZAAR) 100 MG tablet, Take 100 mg by mouth daily., Disp: , Rfl:  meloxicam (MOBIC) 7.5 MG tablet, Take 7.5 mg by mouth daily., Disp: , Rfl:    Multiple Vitamin (MULTIVITAMIN) tablet, Take 1 tablet by mouth daily., Disp: , Rfl:    Omega-3 Fatty Acids (FISH OIL) 1000 MG CPDR, Take 1,000 mg by mouth daily. , Disp: , Rfl:    omeprazole (PRILOSEC) 20 MG capsule, Take 20 mg by mouth daily., Disp: , Rfl:    polyethylene glycol (MIRALAX / GLYCOLAX) packet, Take 17 g by mouth daily as needed for mild constipation. , Disp: , Rfl:    venlafaxine XR (EFFEXOR-XR) 75 MG 24 hr capsule, Take 75 mg by mouth daily., Disp: , Rfl:    vitamin B-12 (CYANOCOBALAMIN) 1000 MCG tablet,  Take 1,000 mcg by mouth daily., Disp: , Rfl:    VOLTAREN 1 % GEL, Apply 2 g topically daily as needed (joint pain). For pain, Disp: , Rfl:    Exam: Current vital signs: There were no vitals taken for this visit. Vital signs in last 24 hours: BP: ()/()  Arterial Line BP: ()/()  GENERAL: Awake, alert in NAD HEENT: - Normocephalic and atraumatic, dry mm, no LN++, no Thyromegally LUNGS - Clear to auscultation bilaterally with no wheezes CV - S1S2 RRR, no m/r/g, equal pulses bilaterally. ABDOMEN - Soft, nontender, nondistended with normoactive BS Ext: warm, well perfused, intact peripheral pulses, no edema  NEURO:  Mental Status: AA&Ox3  Language: speech is nondysarthric.  Naming, repetition, fluency, and comprehension intact. Cranial Nerves: PERRL EOMI, visual fields full, no facial asymmetry, facial sensation intact, hearing intact, tongue/uvula/soft palate midline, normal sternocleidomastoid and trapezius muscle strength. No evidence of tongue atrophy or fibrillations Motor: Some effort dependent weakness on the left side but upon coaching, no vertical drift in the upper extremity but mild drift in the left lower extremity for an NIH score 1 right side full strength. Tone: is normal and bulk is normal Sensation- Intact to light touch bilaterally Coordination: FTN intact bilaterally, no ataxia in BLE. Gait- deferred  NIHSS-1  Labs I have reviewed labs in epic and the results pertinent to this consultation are:  CBC    Component Value Date/Time   WBC 8.3 08/20/2022 1305   RBC 4.10 08/20/2022 1305   HGB 13.3 08/20/2022 1308   HCT 39.0 08/20/2022 1308   PLT 224 08/20/2022 1305   MCV 101.0 (H) 08/20/2022 1305   MCH 32.7 08/20/2022 1305   MCHC 32.4 08/20/2022 1305   RDW 12.0 08/20/2022 1305   LYMPHSABS 2.1 08/20/2022 1305   MONOABS 0.5 08/20/2022 1305   EOSABS 0.2 08/20/2022 1305   BASOSABS 0.1 08/20/2022 1305    CMP     Component Value Date/Time   NA 136 08/20/2022 1308    K 4.5 08/20/2022 1308   CL 102 08/20/2022 1308   CO2 25 09/13/2019 0531   GLUCOSE 159 (H) 08/20/2022 1308   BUN 26 (H) 08/20/2022 1308   CREATININE 1.40 (H) 08/20/2022 1308   CALCIUM 8.6 (L) 09/13/2019 0531   PROT 6.4 (L) 09/11/2019 1700   ALBUMIN 3.8 09/11/2019 1700   AST 21 09/11/2019 1700   ALT 24 09/11/2019 1700   ALKPHOS 57 09/11/2019 1700   BILITOT 0.7 09/11/2019 1700   GFRNONAA 36 (L) 09/13/2019 0531   GFRAA 42 (L) 09/13/2019 0531     Imaging I have reviewed the images obtained:  CT-head-aspects 10   Assessment: 86 year old with above past medical history that includes a significant history of headaches in the past presenting with sudden onset of left-sided pressure  and left-sided motor symptoms along with difficulty with speech.  Symptoms rapidly improving to the point that after the CT scan, her exam is nearly normal. I suspect complex migraine as etiology. A psychogenic etiology cannot be ruled out but it is a diagnosis of exclusion and I would first rule out all organic causes.  Recommendations: Migraine cocktail MRI of the brain with and without contrast.  If negative, and symptoms improved, no further work-up. Follow-up with outpatient neurology in 4 to 6 weeks.  Plan discussed with Dr. Sherry Ruffing  Addendum: MRI brain reviewed-no acute changes.  No stroke. Headache resolved. Discharge home with outpatient neurology follow-up with [facility] neurology, Dr. Lavell Anchors   -- Amie Portland, MD Neurologist [facility] Pager: [phone]

## 2022-08-20 NOTE — ED Provider Notes (Signed)
[facility] EMERGENCY DEPARTMENT Provider Note   CSN: [id number] Arrival date & time: 08/20/22  1256     History  No chief complaint on file.   [name] is a 86 y.o. female.  The history is provided by the patient and medical records. No language interpreter was used.  Neurologic Problem This is a recurrent problem. The current episode started 3 to 5 hours ago. The problem occurs constantly. The problem has been rapidly improving. Associated symptoms include headaches. Pertinent negatives include no chest pain, no abdominal pain and no shortness of breath. Nothing aggravates the symptoms. Nothing relieves the symptoms. She has tried nothing for the symptoms. The treatment provided no relief.       Home Medications Prior to Admission medications   Medication Sig Start Date End Date Taking? Authorizing Provider  acetaminophen (TYLENOL) 500 MG tablet Take 500-1,000 mg by mouth every 6 (six) hours as needed for mild pain.    [provider]  aspirin 81 MG tablet Take 81 mg by mouth daily. Per patient, takes one 81 mg daily    [provider]  CALCIUM CITRATE PO Take 1 tablet by mouth daily.    [provider]  carboxymethylcellulose (REFRESH PLUS) 0.5 % SOLN Place 1 drop into both eyes 3 (three) times daily as needed (dry eyes).     [provider]  Evolocumab with Infusor ([place]) 420 MG/3.5ML SOCT INJECT 420 MG INTO THE SKIN EVERY 30 (THIRTY) DAYS 04/16/22   Lelon Perla, MD  LORazepam (ATIVAN) 1 MG tablet Take 1 tablet (1 mg total) by mouth at bedtime. 09/13/19   Domenic Polite, MD  losartan (COZAAR) 100 MG tablet Take 100 mg by mouth daily.    [provider]  meloxicam (MOBIC) 7.5 MG tablet Take 7.5 mg by mouth daily.    [provider]  Multiple Vitamin (MULTIVITAMIN) tablet Take 1 tablet by mouth daily.    [provider]  Omega-3 Fatty Acids (FISH OIL) 1000 MG CPDR Take  1,000 mg by mouth daily.     [provider]  omeprazole (PRILOSEC) 20 MG capsule Take 20 mg by mouth daily. 09/08/12   [provider]  polyethylene glycol (MIRALAX / GLYCOLAX) packet Take 17 g by mouth daily as needed for mild constipation.     [provider]  venlafaxine XR (EFFEXOR-XR) 75 MG 24 hr capsule Take 75 mg by mouth daily. 04/15/20   [provider]  vitamin B-12 (CYANOCOBALAMIN) 1000 MCG tablet Take 1,000 mcg by mouth daily.    [provider]  VOLTAREN 1 % GEL Apply 2 g topically daily as needed (joint pain). For pain 09/04/12   [provider]      Allergies    Plavix [clopidogrel bisulfate], Prinivil [lisinopril], Sulfa antibiotics, Sulfasalazine, Codeine, Metronidazole, and Clopidogrel    Review of Systems   Review of Systems  Constitutional:  Negative for chills, fatigue and fever.  HENT:  Negative for congestion.   Eyes:  Positive for visual disturbance (per pt).  Respiratory:  Negative for cough, chest tightness and shortness of breath.   Cardiovascular:  Negative for chest pain and palpitations.  Gastrointestinal:  Negative for abdominal pain, constipation, diarrhea, nausea and vomiting.  Genitourinary:  Negative for flank pain and frequency.  Musculoskeletal:  Negative for back pain, neck pain and neck stiffness.  Skin:  Negative for rash and wound.  Neurological:  Positive for facial asymmetry (per pt), weakness (per pt)  and headaches. Negative for dizziness and light-headedness.  Psychiatric/Behavioral:  Negative for agitation.   All other systems reviewed and are negative.   Physical Exam Updated Vital Signs There were no vitals taken for this visit. Physical Exam Vitals and nursing note reviewed.  Constitutional:      General: She is not in acute distress.    Appearance: She is well-developed. She is not ill-appearing, toxic-appearing or diaphoretic.  HENT:     Head: Normocephalic and atraumatic.      Nose: No congestion or rhinorrhea.     Mouth/Throat:     Mouth: Mucous membranes are moist.     Pharynx: No oropharyngeal exudate.  Eyes:     Extraocular Movements: Extraocular movements intact.     Conjunctiva/sclera: Conjunctivae normal.     Pupils: Pupils are equal, round, and reactive to light.  Cardiovascular:     Rate and Rhythm: Normal rate and regular rhythm.     Heart sounds: No murmur heard. Pulmonary:     Effort: Pulmonary effort is normal. No respiratory distress.     Breath sounds: Normal breath sounds. No wheezing, rhonchi or rales.  Chest:     Chest wall: No tenderness.  Abdominal:     General: Abdomen is flat.     Palpations: Abdomen is soft.     Tenderness: There is no abdominal tenderness. There is no guarding or rebound.  Musculoskeletal:        General: No swelling or tenderness.     Cervical back: Neck supple. No tenderness.  Skin:    General: Skin is warm and dry.     Capillary Refill: Capillary refill takes less than 2 seconds.     Findings: No erythema.  Neurological:     General: No focal deficit present.     Mental Status: She is alert. Mental status is at baseline.     Cranial Nerves: No facial asymmetry.     Sensory: No sensory deficit.     Motor: No weakness.     Comments: No focal neurologic deficit on my initial exam.  Psychiatric:        Mood and Affect: Mood normal.     ED Results / Procedures / Treatments   Labs (all labs ordered are listed, but only abnormal results are displayed) Labs Reviewed  CBC - Abnormal; Notable for the following components:      Result Value   MCV 101.0 (*)    All other components within normal limits  COMPREHENSIVE METABOLIC PANEL - Abnormal; Notable for the following components:   Glucose, Bld 164 (*)    Creatinine, Ser 1.53 (*)    Total Protein 6.3 (*)    GFR, Estimated 33 (*)    All other components within normal limits  CBG MONITORING, ED - Abnormal; Notable for the following components:    Glucose-Capillary 166 (*)    All other components within normal limits  I-STAT CHEM 8, ED - Abnormal; Notable for the following components:   BUN 26 (*)    Creatinine, Ser 1.40 (*)    Glucose, Bld 159 (*)    Calcium, Ion 1.05 (*)    All other components within normal limits  RESP PANEL BY RT-PCR (FLU A&B, COVID) ARPGX2  ETHANOL  PROTIME-INR  APTT  DIFFERENTIAL  RAPID URINE DRUG SCREEN, HOSP PERFORMED  URINALYSIS, ROUTINE W REFLEX MICROSCOPIC    EKG EKG Interpretation  Date/Time:  Friday August 20 2022 13:15:30 EDT Ventricular Rate:  83 PR Interval:  170 QRS  Duration: 95 QT Interval:  377 QTC Calculation: 443 R Axis:   27 Text Interpretation: Sinus rhythm more artifact than prior. no STEMI Confirmed by Antony Blackbird [phone]) on 08/20/2022 3:31:13 PM  Radiology MR Brain W and Wo Contrast  Result Date: 08/20/2022 CLINICAL DATA:  Neuro deficit, acute, stroke suspected. Transient vision changes, left arm weakness, and aphasia. EXAM: MRI HEAD WITHOUT AND WITH CONTRAST TECHNIQUE: Multiplanar, multiecho pulse sequences of the brain and surrounding structures were obtained without and with intravenous contrast. CONTRAST:  6.37m GADAVIST GADOBUTROL 1 MMOL/ML IV SOLN COMPARISON:  CT head from the same day. FINDINGS: Brain: No acute infarction, hemorrhage, hydrocephalus, extra-axial collection or mass lesion. Vascular: Major arterial flow voids are maintained skull base. Skull and upper cervical spine: Normal marrow signal. Sinuses/Orbits: Clear sinuses.  No acute orbital findings. Other: No mastoid effusions IMPRESSION: No evidence of acute intracranial abnormality. Electronically Signed   By: FMargaretha SheffieldM.D.   On: 08/20/2022 15:08   CT HEAD CODE STROKE WO CONTRAST  Result Date: 08/20/2022 CLINICAL DATA:  Code stroke.  Left-sided droop EXAM: CT HEAD WITHOUT CONTRAST TECHNIQUE: Contiguous axial images were obtained from the base of the skull through the vertex without intravenous  contrast. RADIATION DOSE REDUCTION: This exam was performed according to the departmental dose-optimization program which includes automated exposure control, adjustment of the mA and/or kV according to patient size and/or use of iterative reconstruction technique. COMPARISON:  Brain MRI 01/25/2019 FINDINGS: Brain: There is no acute intracranial hemorrhage, extra-axial fluid collection, or acute infarct. Parenchymal volume is normal. The ventricles are normal in size. Gray-white differentiation is preserved. There is no mass lesion.  There is no mass effect or midline shift. Vascular: There is calcification of the bilateral cavernous ICAs. Skull: Normal. Negative for fracture or focal lesion. Sinuses/Orbits: The paranasal sinuses are clear. Other: Bilateral lens implants are in place. The globes and orbits are otherwise unremarkable. ASPECTS ([facility] Early CT Score) - Ganglionic level infarction (caudate, lentiform nuclei, internal capsule, insula, M1-M3 cortex): 7 - Supraganglionic infarction (M4-M6 cortex): 3 Total score (0-10 with 10 being normal): 10 IMPRESSION: 1. No acute intracranial pathology. 2. ASPECTS is 10 Findings were communicated to Dr. ARory Percyat 1:10 pm. Electronically Signed   By: PValetta MoleM.D.   On: 08/20/2022 13:15    Procedures Procedures    Medications Ordered in ED Medications  gadobutrol (GADAVIST) 1 MMOL/ML injection 6.5 mL (6.5 mLs Intravenous Contrast Given 08/20/22 1505)    ED Course/ Medical Decision Making/ A&P                           Medical Decision Making Amount and/or Complexity of Data Reviewed Radiology: ordered.  Risk Prescription drug management.    NHERMINE FERIAis a 86y.o. female with a past medical history significant for CAD, carotid disease, CHF, GERD, hyperlipidemia, previous stroke, and previous migraines who presents as a code stroke for reported sudden onset of dysarthria, left-sided facial droop, left arm weakness, vision  changes and headache.  According to EMS, last known well was at 10:30 AM.  Patient said that she has a history of headaches that do cause ocular changes which the patient did experience.  She reports that today though she had difficulty with speaking and felt her left arm was weak and possibly some left-sided facial droop.  Patient says that she is now back to normal and has no other symptoms.  She said that her  headache was all over her head but in the left side and had flashes in the medial aspect of her left eyes vision.  She reports that she has had these vision changes with her headaches in the past but the other symptoms felt different.  She otherwise reports no recent fevers, chills, neck pain, neck stiffness, congestion, cough, nausea, vomiting, constipation, diarrhea.  Denies any leg symptoms.  On my exam, lungs clear and chest nontender.  Abdomen nontender.  She had symmetric sensation in legs, symmetric sensation in face and arms.  Symmetric grip strength and symmetric smile.  Pupils are symmetric and reactive normal extraocular movements.  Clear speech for me.  Patient did not have any other abnormalities on my exam.  No carotid bruit appreciated on my initial exam.  Neurology felt that she needs an MRI with and without to look down for stroke but also help look for a dural venous sinus thrombus given her atypical headaches more recently.  After work-up is completed, they requested for the emergency team to touch base to determine if she is safe for discharge home or needs to be admitted.  If she starts having more headache, they would recommend a headache cocktail but as of my last evaluation she is not having any symptoms or headache at this time.   MRI shows no evidence of acute stroke at this time.  Given resolution of neurologic deficits and well appearance now, we do feel she is safe for discharge home.  Neurology recommends discharge with follow-up with Dr. Lavell Anchors her neurologist as an  outpatient.  Patient agrees.  After p.o. challenge will discharge for outpatient neuro follow-up.          Final Clinical Impression(s) / ED Diagnoses Final diagnoses:  Nonintractable headache, unspecified chronicity pattern, unspecified headache type  Transient neurologic deficit  Transient vision disturbance of left eye  Transient speech disturbance    Rx / DC Orders ED Discharge Orders          Ordered    Ambulatory referral to Neurology       Comments: An appointment is requested in approximately: 2 weeks   08/20/22 1529            Clinical Impression: 1. Nonintractable headache, unspecified chronicity pattern, unspecified headache type   2. Transient neurologic deficit   3. Transient vision disturbance of left eye   4. Transient speech disturbance     Disposition: Discharge  Condition: Good  I have discussed the results, Dx and Tx plan with the pt(& family if present). He/she/they expressed understanding and agree(s) with the plan. Discharge instructions discussed at great length. Strict return precautions discussed and pt &/or family have verbalized understanding of the instructions. No further questions at time of discharge.    New Prescriptions   No medications on file    Follow Up: Melvenia Beam, [place] [address] [phone]     [facility] EMERGENCY DEPARTMENT [address] [id number] mc [place], [place], [place] [address] [phone]       .    Donavan Kerlin, Gwenyth Allegra, MD 08/20/22 [phone]

## 2022-08-20 NOTE — Code Documentation (Signed)
Stroke Response Nurse Documentation Code Documentation  [name] is a 86 y.o. female arriving to [facility]  via [facility] on 08/20/2022  with past medical hx of migraine, stroke, HLD, HTN,CHF, DM. On aspirin 81 mg daily. Code stroke was activated by EMS.   Patient from home where she was LKW at 1030 08/20/2022 and now complaining of slurred speech, left sided facial droop, left sided headache . Patient last seen by daughter 60 AM today, at 1208 she was found by daughter with acute left sided weakenss, facial droop, difficulty speaking, complaining of a left sided headache.    Stroke team at the bedside on patient arrival. Labs drawn and patient cleared for CT by Dr. Ashok Cordia. Patient to CT with team. NIHSS 2, see documentation for details and code stroke times. Patient with left facial droop and dysarthria  on exam. The following imaging was completed:  CT Head. Patient is not a candidate for IV Thrombolytic due to stroke not suspected, per MD. Patient is not a candidate for IR due to stroke not suspected per MD.   Care Plan: q2h VS/mNIH.   Bedside handoff with ED RN Paden.    Charise Carwin  Stroke Response RN

## 2022-08-20 NOTE — Discharge Instructions (Addendum)
Your history, exam and work-up today did not show evidence of acute stroke or other acute abdomen today.  The neurology and felt this was likely a complex and atypical migraine with a different presentation than it has in the past.  As you are neurologic deficits have resolved and your work-up is reassuring, they feel you are safe for discharge home for follow-up with Dr. Lavell Anchors, your neurologist.  Please call her to schedule an appointment.  Please rest and stay hydrated. If any symptoms change or worsen, please return to the nearest emergency department.

## 2022-12-16 ENCOUNTER — Ambulatory Visit (INDEPENDENT_AMBULATORY_CARE_PROVIDER_SITE_OTHER): Payer: Medicare Other | Admitting: Neurology

## 2022-12-16 ENCOUNTER — Encounter: Payer: Self-pay | Admitting: Neurology

## 2022-12-16 VITALS — BP 150/78 | HR 72 | Wt 144.4 lb

## 2022-12-16 DIAGNOSIS — G43101 Migraine with aura, not intractable, with status migrainosus: Secondary | ICD-10-CM | POA: Diagnosis not present

## 2022-12-16 NOTE — Progress Notes (Signed)
[facility]    Provider:  Dr Jaynee Eagles Requesting Provider: Marshall Cork MD Primary Care Provider:  Derinda Late, MD  CC:  Migraines  HPI 12/16/2022: This is a lovely female [name] who is 86 years old who we know very well we have seen her in the past multiple times for multiple neurologic complaints ongoing for decades.  She is sent to [place] again for hospital follow-up by Dr. Sandi Mariscal.  She has a past medical history of chronic diastolic heart failure, diabetes, hyperlipidemia, prior stroke no residual deficits, headache who presented with a sudden onset of left-sided headache pressure was sudden onset left-sided arm and leg weakness, she reports that she was in her usual state of health and somewhere around 12 noon is when the family noticed that her speech was garbled, she was having a difficult time bringing her words out, she complained that her speech was garbled, pressure pain on her left side of her head and weakness in the left side of her body, speech improved, pressure sensation became less but still feels weak in the side, she has not had a headache in a while but has had occipital neuralgia in the past, and no migraine medications at this time, reports having had migraines all her life but no migraines over the past year or so, this last week has had 2 migraines, she got nerve blocks with me in 2021, she is here after being seen in the emergency room, I reviewed Dr. Elnora Morrison notes, physical exam was normal, some effort dependent weakness on the left side but no vertical drift in the upper but mild drift in the lower for an NIH score of 1 right side full strength, everything else appeared to be normal, her labs including CBC and CMP were remarkable only for MCV 101, elevated glucose 159 BUN 26 creatinine 1.4 with some reduced GFR, CT of the head was negative and this was MRI for nothing acute, symptoms rapidly improved to the point that after her CT scan her exam was  nearly normal psychologic at etiology could not be ruled out but she was diagnosed with "complicated headaches.  Per Dr. Deboraha Sprang notes, she has been having more migraine headaches, left visual aura for a few minutes followed by mild left retro-orbital pressure for few minutes, 2 days later she developed a left visual aura for few seconds then immediately developed expressive aphasia and weakness to the left arm and leg, that is when her family called EMS and she was taken to the ER as above.  Extensive evaluation by Dr. Malen Gauze.  No changes on brain MRI and CT scan.  Diagnosed with "complex migraine".  Today I will be seeing her for migraines. She has migraines seldomly she gets maybe a few a year.   She is here alone and has ocular migraines, she had an ocular migraine and she had speech problems she remembers everything she was worked up at the hospital and diagnosed with migraine. We discussed migraine aura and gave her some literature on this phenomenon, if it happens again would probably give you nurtec since triptans are contraindicated due to stroke. No other focal neurologic deficits, associated symptoms, inciting events or modifiable factors.    MRI brain 08/20/2022: MRI HEAD WITHOUT AND WITH CONTRAST: personally reviewed.   TECHNIQUE: Multiplanar, multiecho pulse sequences of the brain and surrounding structures were obtained without and with intravenous contrast.   CONTRAST:  6.64m GADAVIST GADOBUTROL 1 MMOL/ML IV SOLN   COMPARISON:  CT head  from the same day.   FINDINGS: Brain: No acute infarction, hemorrhage, hydrocephalus, extra-axial collection or mass lesion.   Vascular: Major arterial flow voids are maintained skull base.   Skull and upper cervical spine: Normal marrow signal.   Sinuses/Orbits: Clear sinuses.  No acute orbital findings.   Other: No mastoid effusions   IMPRESSION: No evidence of acute intracranial abnormality.   I reviewed all her medications  thorough review of chart, medications tried that can be used in migraine management include: Tylenol, amlodipine(similar to verapamil), Flexeril, Depakote, losartan, Lyrica, meloxicam, Topamax, methylprednisolone injections, amitriptyline and nortriptyline are contraindicated due to her age as well as she is on Effexor which in combination with other serotonin drugs can cause serotonin syndrome.  Triptans are contraindicated due to prior stroke, propranolol contraindicated due to bradycardia and already being on a blood pressure medication could cause hypotension.   HPI 09/24/2020:  [name] is a 86 y.o. female here as requested by [facility] for multiple neurologic complaints as in the past ongoing for decades.  Past medical history stroke, hypertension, hyperlipidemia, diabetes, chronic diastolic heart failure, carotid artery occlusion, deep vein thrombosis, aortic insufficiency, coronary artery disease, degenerative disc disease of the cervical spine and the lumbar spine, chronic kidney disease, chronic dizziness for years, generalized weakness, hyperlipidemia, history of stroke  Pain started in the back of the head, needles and pins, (points to the emergence of the greater occipital nerve). She doesn't know when it is coming 1-2 minutes, pins and needles, numbness and shoots to the left half circle of the head. It is not severe. Random, happens at night when she lays on that side of the head pressure on it or sitting in her lounge chair. Not very severe per patient, she has had it for months. Not hurting right now. But daughter says when it hits her it is severe and nearly falls off her walker, daughter disagrees with the severity, she goes "woo". She denies any migrainous features, this is not her migraines. Patient denies it is severe but daughter says she told her she can;t live and patient states it isn't and she isn't sure why she says those things. She says it really doesn't.  Reviewed  notes,  She haslabs and imaging from outside physicians, which showed:  I reviewed Dr. Romona Curls notes: She has numbness and tingling in the left side of the head, some intermittent left-sided neck pain, started 2 months prior to his appointment when he saw her on July 23, 2020, but I don;t see anymore details I have reached out to Dr. Ernestina Patches.   MRI cervical spine: personally reviewed images and agree: Postsurgical changes and spondylosis of the cervical spine have not significantly changed as compared to cervical CT myelogram performed 01/15/2016. Findings are most notably as follows.   At C3-C4, vertebral osteophytosis and facet hypertrophy contribute to moderate central canal stenosis with slight flattening of the ventral cord. Bilateral neural foraminal narrowing (mild right, moderate left).   Additional sites of neural foraminal narrowing as described and greatest on the left at C5-C6 (severe at this site).   Redemonstrated C5-C6 pseudoarthrosis.  Review of prior notes:   We have seen patient for chronic, continuous, subjective dizziness and performed extensive evaluations much like multiple other physicians.  I reviewed Dr. Trilby Leaver notes who states that she has been to neuro-ophthalmology and other physicians who found no etiology, given the multitude of negative testing in the subjective nature of complaints he had nothing further to  be done.  Patient has been to Dr. Sanda Klein at [facility] August 2019, she notes unsteadiness and wandering eyes, not evident on examination by ophthalmology, no nystagmus, Dr. Kathlen Mody stated maybe she could go to [facility] for neuro-ophthalmology.  She complained to Dr. Kathlen Mody about subjective visual disturbances, chronic headaches,, her eyes are wandering all over the place, is hard to see sometimes, hard to walk at times due to her wandering eyes, when asked if she has double vision she reports her vision is very distorted and blurry, she has a pinched  nerve in her back she thinks at C5 and C6 and it leads up and "explodes in my head and pinches my eyes", she has had multiple imaging studies, Dr. Kathlen Mody said "very hard to understand exactly what the main complaint is, pupils equally round and reactive to light, no APD, extraocular muscles are full, normal ocular adnexa, normal iris, no disc edema, normal nerve fiber layer analysis.  Patient is seen currently at orthopedics for occipital neuralgia.  Dr. Sandi Mariscal has recently performed an MRI of the cervical spine.  MRI of the brain completed August 2020 Dr. Sandi Mariscal as well as CT angio of the head and neck.  She has had sleep studies completed in February 2018.  She has had echocardiograms, 2019.Marland Kitchen   in fact she has been to Dr. Jolyn Nap at [facility] August 2019, she has been extensively evaluated by our office, Dr. Sandi Mariscal, neuro-ophthalmology at [facility] who found no etiology, multitude of negative testing    Review of Systems: Patient complains of symptoms per HPI as well as the following symptoms: headache, neck pain. Pertinent negatives and positives per HPI. All others negative.   Social History   Socioeconomic History   Marital status: Married    Spouse name: Pilar Plate    Number of children: 3   Years of education: 12   Highest education level: Not on file  Occupational History   Not on file  Tobacco Use   Smoking status: Former    Packs/day: 1.00    Years: 3.00    Total pack years: 3.00    Types: Cigarettes    Quit date: 02/08/1979    Years since quitting: 43.8   Smokeless tobacco: Never  Vaping Use   Vaping Use: Never used  Substance and Sexual Activity   Alcohol use: No   Drug use: No   Sexual activity: Not on file  Other Topics Concern   Not on file  Social History Narrative   Lives at home with husband, Pilar Plate.   Caffeine use: 1 cup coffee per day.   Social Determinants of Health   Financial Resource Strain: Not on file  Food Insecurity: Not on file   Transportation Needs: Not on file  Physical Activity: Not on file  Stress: Not on file  Social Connections: Not on file  Intimate Partner Violence: Not on file    Family History  Problem Relation Age of Onset   Dementia Mother    Heart attack Father     Past Medical History:  Diagnosis Date   Allergy    Carotid artery occlusion    Chronic diastolic CHF (congestive heart failure) ([place]) 12/2009   Diabetes mellitus without complication ([place])    History of shingles 11/2008   Hyperlipidemia    Hypertension    Kidney stones 2012   Lipoma    L eyelid   Osteoarthritis of cervical spine    Stroke ([place]) 12/2009    Patient Active Problem List  Diagnosis Date Noted   Migraine with aura and with status migrainosus, not intractable 12/16/2022   History of stroke 09/23/2020   CAD (coronary artery disease) 08/12/2016   Hyperlipidemia 01/02/2016   Essential hypertension 01/02/2016   Aortic insufficiency 01/02/2016   Dizziness 09/10/2015   Carotid artery stenosis 10/03/2012   DEEP VENOUS THROMBOPHLEBITIS, LEG, LEFT 03/02/2010   DEGENERATIVE DISC DISEASE, CERVICAL SPINE 03/02/2010   DEGENERATIVE [place] DISEASE, LUMBAR SPINE 03/02/2010   SHINGLES 02/08/2010   DISCITIS 02/08/2010   Type II diabetes mellitus with renal manifestations ([place]) 12/2009   Hyponatremia 12/2009   Generalized weakness 12/2009   Leukocytosis 12/2009   Chronic diastolic CHF (congestive heart failure) ([place]) 12/2009   GERD (gastroesophageal reflux disease) 12/2009   CKD (chronic kidney disease), stage III 12/2009   HLD (hyperlipidemia) 12/2009    Past Surgical History:  Procedure Laterality Date   [place]   right - 5 screws & plate   CHOLECYSTECTOMY  1988   FOOT FUSION  2005   left - 4 screws   GALLBLADDER SURGERY  1988   large stone blockage   HERNIA REPAIR  1963   left side   LUMBAR [place]   LUMBAR EPIDURAL INJECTION  May-Aug-Sept. 2015/  08-29-15   ROTATOR CUFF REPAIR  1994   right   spinal fusion surgery  2000   C4 and C5   spinal fusion surgery  2003, 2010   C6 and C7   SPINE SURGERY  2000   C4-C5 fusion   SPINE SURGERY  2003   C6-C7 fusion   SPINE SURGERY  2010   replace broken plate (cervical region)   STOMACH SURGERY  2002   mass rt & left (not cancerous)    Current Outpatient Medications  Medication Sig Dispense Refill   acetaminophen (TYLENOL) 500 MG tablet Take 500-1,000 mg by mouth every 6 (six) hours as needed for mild pain.     aspirin 81 MG tablet Take 81 mg by mouth daily. Per patient, takes one 81 mg daily     CALCIUM CITRATE PO Take 1 tablet by mouth daily.     carboxymethylcellulose (REFRESH PLUS) 0.5 % SOLN Place 1 drop into both eyes 3 (three) times daily as needed (dry eyes).      Evolocumab with Infusor ([place]) 420 MG/3.5ML SOCT INJECT 420 MG INTO THE SKIN EVERY 30 (THIRTY) DAYS 3.5 mL 11   LORazepam (ATIVAN) 1 MG tablet Take 1 tablet (1 mg total) by mouth at bedtime. 30 tablet 0   losartan (COZAAR) 100 MG tablet Take 100 mg by mouth daily.     meloxicam (MOBIC) 7.5 MG tablet Take 7.5 mg by mouth daily.     Multiple Vitamin (MULTIVITAMIN) tablet Take 1 tablet by mouth daily.     Omega-3 Fatty Acids (FISH OIL) 1000 MG CPDR Take 1,000 mg by mouth in the morning and at bedtime.     omeprazole (PRILOSEC) 20 MG capsule Take 20 mg by mouth daily.     polyethylene glycol (MIRALAX / GLYCOLAX) packet Take 17 g by mouth daily as needed for mild constipation.      venlafaxine XR (EFFEXOR-XR) 75 MG 24 hr capsule Take 75 mg by mouth daily.     vitamin B-12 (CYANOCOBALAMIN) 1000 MCG tablet Take 1,000 mcg by mouth daily.     VOLTAREN 1 % GEL Apply 2 g topically daily as needed (joint pain). For pain  No current facility-administered medications for this visit.    Allergies as of 12/16/2022 - Review Complete 12/16/2022  Allergen Reaction Noted   Plavix [clopidogrel bisulfate] Other  (See Comments) 02/09/2012   Prinivil [lisinopril] Swelling 02/09/2012   Sulfa antibiotics Hives 02/09/2012   Sulfasalazine Hives 02/09/2012   Codeine Nausea And Vomiting 09/11/2019   Metronidazole  01/07/2016   Clopidogrel Palpitations 08/19/2017    Vitals: BP (!) 150/78   Pulse 72   Wt 144 lb 6.4 oz (65.5 kg)   SpO2 98%   BMI 28.20 kg/m  Last Weight:  Wt Readings from Last 1 Encounters:  12/16/22 144 lb 6.4 oz (65.5 kg)   Last Height:   Ht Readings from Last 1 Encounters:  11/27/20 5' (1.524 m)   Physical exam: Exam: Gen: NAD, conversant, well nourised,  well groomed                     CV: +SEM regular rate, no RG. No Carotid Bruits. No peripheral edema, warm, nontender Eyes: Conjunctivae clear without exudates or hemorrhage  Neuro: Detailed Neurologic Exam  Speech:    Speech is normal; fluent and spontaneous with normal comprehension.  Cognition:    The patient is oriented to person, place, and time;     recent and remote memory intact;     language fluent;     normal attention, concentration,     fund of knowledge Cranial Nerves:    The pupils are equal, round, and reactive to light. NO ONH edema. Visual fields are full to finger confrontation. Extraocular movements are intact. Trigeminal sensation is intact and the muscles of mastication are normal. Left lid is a little lower right lid but wouldn't call it ptosis, otherwise The face is symmetric. The palate elevates in the midline. Hearing intact. Voice is normal. Shoulder shrug is normal. The tongue has normal motion without fasciculations.   Coordination:    Normal finger to nose and heel to shin.    Gait:  Walker, good stride, not parkinsonian,   Motor Observation:    No asymmetry, some distal atrophy in the hands but her grip is strong, and no involuntary movements noted. Tone:    Normal muscle tone.    Posture:    Posture is normal. normal erect    Strength: right prox weakness arm (chronic, rotator  cuff) otherwise strength is V/V in the upper and lower limbs.      Sensation: intact to LT     Reflex Exam:  DTR's:    Deep tendon reflexes in the upper and lower extremities are brisk  bilaterally.   Toes:    The toes are downgoing bilaterally.   Clonus:    Clonus is absent.   Assessment/Plan:  86 y.o. female here as requested for migraines. This is a lovely female Ms. Bram who is 86 years old who we know very well we have seen her in the past multiple times for multiple neurologic complaints ongoing for decades.   She has a past medical history of chronic diastolic heart failure, diabetes, hyperlipidemia, prior stroke no residual deficits, headache who presented with a sudden onset of left-sided headache pressure was sudden onset left-sided arm and leg weakness, she reports that she was in her usual state of health and somewhere around 12 noon is when the family noticed that her speech was garbled, she was having a difficult time bringing her words out, she complained that her speech was garbled, pressure pain on her left  side of her head and weakness in the left side of her body, speech improved, pressure sensation became less. reports having had migraines all her life but no migraines over the past year or so, this last week has had 2 migraines, she got nerve blocks with me in 2021, she is here after being seen in the emergency room, I reviewed Dr. Elnora Morrison notes, physical exam was normal, some effort dependent weakness on the left side but no vertical drift in the upper but mild drift in the lower for an NIH score of 1 right side full strength, everything else appeared to be normal, her labs including CBC and CMP were remarkable only for MCV 101, elevated glucose 159 BUN 26 creatinine 1.4 with some reduced GFR, CT of the head was negative and this was MRI for nothing acute, symptoms rapidly improved to the point that after her CT scan her exam was nearly normal psychologic at etiology could not  be ruled out but she was diagnosed with "complicated headaches. Neuro exam is very good today.   Per She is here alone and has ocular migraines, she had an ocular migraine and she had speech problems she remembers everything she was worked up at the hospital and diagnosed with migraine. We discussed migraine aura and gave her some literature on this phenomenon, if it happens again would probably give you nurtec since triptans are contraindicated due to stroke. No other focal neurologic deficits, associated symptoms, inciting events or modifiable factors.      PRIOR ASSESSMENT AND PLAN: 12/16/2022:   - Patient reports pain consistent with occipital neuralgia on the right side of the head: Pain is right side and starts in the distribution of the occipital nerves, paroxysmal and brief(lasts about a minute), painful, tingling, with tenderness and trigger points at the emergence of the greater occipital nerve and radiation to the right side of the scalp.   - The occipital nerve has partial innervation from C3. Patient has C3-C4 facet hypertrophy, moderate central canal stenosis, bilateral neural foraminal narrowing so neuralgia could be originating here.  - She does not have any pain today and denies severe pain.She says it is "not that painful" and vehemently denies it is painful "really,nothing painful". She doesn't know why she tells her daughter it is severe, doesn't know why she does that per patient but states its "not that bad".   - She had occipital nerve blocks and trigger point injections today will see how she does  Performed by Dr. Jaynee Eagles M.D. 3% marcaine and 3% lidocaine 5m total each 30-gauge needle was used. All procedures a documented blood were medically necessary, reasonable and appropriate based on the patient's history, medical diagnosis and physician opinion. Verbal informed consent was obtained from the patient, patient was informed of potential risk of procedure, including  bruising, bleeding, hematoma formation, infection, muscle weakness, muscle pain, numbness, transient hypertension, transient hyperglycemia and transient insomnia among others. All areas injected were topically clean with isopropyl rubbing alcohol. Nonsterile nonlatex gloves were worn during the procedure.  1. Greater occipital nerve block ([phone]. The greater occipital nerve site was identified at the nuchal line medial to the occipital artery. Medication was injected into the left occipital nerve areas and suboccipital areas. Patient's condition is associated with inflammation of the greater occipital nerve and associated multiple groups. Injection was deemed medically necessary, reasonable and appropriate. Injection represents a separate and unique surgical service.   Cc: Tegeler, CGwenyth Allegra*,  PDerinda Late MD  ASarina Ill MD  [facility] [address] [place], [place] [address] Phone [phone] Fax [phone]  I spent 30 minutes of face-to-face and non-face-to-face time with patient on the  1. Migraine with aura and with status migrainosus, not intractable    diagnosis.  This included previsit chart review, lab review, study review, order entry, electronic health record documentation, patient education on the different diagnostic and therapeutic options, counseling and coordination of care, risks and benefits of management, compliance, or risk factor reduction

## 2023-01-17 ENCOUNTER — Other Ambulatory Visit (HOSPITAL_COMMUNITY): Payer: Self-pay

## 2023-01-27 ENCOUNTER — Other Ambulatory Visit (HOSPITAL_COMMUNITY): Payer: Self-pay | Admitting: Family Medicine

## 2023-01-27 ENCOUNTER — Ambulatory Visit (HOSPITAL_COMMUNITY)
Admission: RE | Admit: 2023-01-27 | Discharge: 2023-01-27 | Disposition: A | Discharge status: Home / Self Care | Payer: Medicare Other | Source: Ambulatory Visit | Attending: Vascular Surgery | Admitting: Vascular Surgery

## 2023-01-27 DIAGNOSIS — I6521 Occlusion and stenosis of right carotid artery: Secondary | ICD-10-CM | POA: Insufficient documentation

## 2023-02-25 ENCOUNTER — Ambulatory Visit
Admission: RE | Admit: 2023-02-25 | Discharge: 2023-02-25 | Disposition: A | Discharge status: Home / Self Care | Payer: Medicare Other | Source: Ambulatory Visit | Attending: Family Medicine | Admitting: Family Medicine

## 2023-02-25 ENCOUNTER — Other Ambulatory Visit: Payer: Self-pay | Admitting: Family Medicine

## 2023-02-25 DIAGNOSIS — M75111 Incomplete rotator cuff tear or rupture of right shoulder, not specified as traumatic: Secondary | ICD-10-CM

## 2023-02-25 DIAGNOSIS — M19011 Primary osteoarthritis, right shoulder: Secondary | ICD-10-CM

## 2023-03-27 ENCOUNTER — Other Ambulatory Visit: Payer: Self-pay | Admitting: Cardiology

## 2023-03-29 ENCOUNTER — Telehealth: Payer: Self-pay | Admitting: Cardiology

## 2023-03-29 MED ORDER — REPATHA PUSHTRONEX SYSTEM 420 MG/3.5ML ~~LOC~~ SOCT: SUBCUTANEOUS | 3 refills | Status: DC

## 2023-03-29 NOTE — Telephone Encounter (Signed)
 *  STAT* If patient is at the pharmacy, call can be transferred to refill team.   1. Which medications need to be refilled? (please list name of each medication and dose if known)   Evolocumab with Infusor ([place]) 420 MG/3.5ML SOCT    2. Which pharmacy/location (including street and city if local pharmacy) is medication to be sent to? [place], [place]   3. Do they need a 30 day or 90 day supply? 90 days    Pt made an appt with Mayra Reel on 04/30 at 3:35 pm

## 2023-03-30 ENCOUNTER — Other Ambulatory Visit: Payer: Self-pay | Admitting: Cardiology

## 2023-03-30 NOTE — Telephone Encounter (Signed)
New message:    Ref#  [id number]     She needs a verbal order to replace patient's Repatha that was damaged.

## 2023-03-31 MED ORDER — REPATHA PUSHTRONEX SYSTEM 420 MG/3.5ML ~~LOC~~ SOCT
1.0000 | SUBCUTANEOUS | 0 refills | Status: DC
Start: 2023-03-31 — End: 2023-03-31

## 2023-03-31 NOTE — Telephone Encounter (Signed)
Refill sent in. Also received separate request for 1 replacement pump to be sent to [facility], this has been sent in.

## 2023-03-31 NOTE — Addendum Note (Signed)
Addended by: Irelynd Zumstein E on: 03/31/2023 08:56 AM   Modules accepted: Orders

## 2023-03-31 NOTE — Telephone Encounter (Signed)
I have sent in rx for replacement pen, pt aware.

## 2023-04-22 LAB — LAB REPORT - SCANNED
A1c: 5.7
EGFR: 45

## 2023-04-24 NOTE — Progress Notes (Deleted)
   Cardiology Clinic Note   Date: 04/24/2023 ID: [name], DOB 1936-07-27, MRN [id number]  Primary Cardiologist:  None  Patient Profile    [name] is a 87 y.o. female who presents to the clinic today for ***  Past medical history significant for: CAD. CT abdomen pelvis 07/21/2016: Coronary artery calcifications LAD and RCA. Carotid artery stenosis. Carotid ultrasound 01/27/2023: 40 to 59% right ICA.  1 to 39% left ICA. Chronic diastolic heart failure/aortic insufficiency. Echo 05/23/2018: EF 60 to 65%.  Grade I DD.  Mild AI.  Mild MR and TR. Hypertension. Hyperlipidemia. Migraines. T2DM. GERD.   History of Present Illness    [name] was evaluated by Dr. Jens Som on 12/31/2015 for hyperlipidemia.  She had not been seen in the office since 2011.  She continues to be followed by Dr. Royann Shivers for the above outlined history.  Patient was last evaluated via telehealth visit by Corine Shelter, PA-C on 09/23/2020 for routine follow-up.  She noted recent hypertension with SBP 150-160.  She stated PCP would prefer to handle hypertension.  Otherwise she was doing well from a cardiac standpoint and no changes were made.  Today, patient ***  CAD.  Coronary artery calcifications in LAD and RCA found incidentally on CT abdomen pelvis July 2017.  Patient*** Continue aspirin, Repatha. Carotid artery stenosis.  Carotid ultrasound February 2024 showed 40 to 59% right ICA, 1 to 39% left ICA.  Patient denies lightheadedness, dizziness, presyncope, syncope.  Continue aspirin and Repatha.  PCP following. Hypertension. BP today *** Patient denies headaches, dizziness or vision changes. Continue losartan. Hyperlipidemia.***   ROS: All other systems reviewed and are otherwise negative except as noted in History of Present Illness.  Studies Reviewed    ECG personally reviewed by me today: ***  No significant changes from ***  Risk Assessment/Calculations    {Does this patient have  ATRIAL FIBRILLATION?:[phone]} No BP recorded.  {Refresh Note OR Click here to enter BP  :1}***        Physical Exam    VS:  There were no vitals taken for this visit. , BMI There is no height or weight on file to calculate BMI.  GEN: Well nourished, well developed, in no acute distress. Neck: No JVD or carotid bruits. Cardiac: *** RRR. No murmurs. No rubs or gallops.   Respiratory:  Respirations regular and unlabored. Clear to auscultation without rales, wheezing or rhonchi. GI: Soft, nontender, nondistended. Extremities: Radials/DP/PT 2+ and equal bilaterally. No clubbing or cyanosis. No edema ***  Skin: Warm and dry, no rash. Neuro: Strength intact.  Assessment & Plan   ***  Disposition: ***     {Are you ordering a CV Procedure (e.g. stress test, cath, DCCV, TEE, etc)?   Press F2        :[id number]}   Signed, Etta Grandchild. Caidin Heidenreich, DNP, NP-C

## 2023-04-26 ENCOUNTER — Ambulatory Visit: Payer: Medicare Other | Admitting: Student

## 2023-05-12 ENCOUNTER — Other Ambulatory Visit (HOSPITAL_COMMUNITY): Payer: Self-pay | Admitting: Family Medicine

## 2023-05-12 DIAGNOSIS — I6521 Occlusion and stenosis of right carotid artery: Secondary | ICD-10-CM

## 2023-05-13 ENCOUNTER — Ambulatory Visit (HOSPITAL_COMMUNITY): Admission: RE | Admit: 2023-05-13 | Payer: Medicare Other | Source: Ambulatory Visit

## 2023-05-13 ENCOUNTER — Encounter (HOSPITAL_COMMUNITY): Payer: Self-pay

## 2023-06-08 NOTE — Progress Notes (Addendum)
Cardiology Clinic Note   Date: 06/09/2023 ID: SOFIA VANMETER, DOB 06/15/1936, MRN [id number]  Primary Cardiologist:  Olga Millers, MD  Patient Profile    [name] is a 87 y.o. female who presents to the clinic today for overdue follow up.      Past medical history significant for: Coronary artery calcifications. CT abdomen pelvis 07/21/2016: Scattered coronary artery calcifications LAD and RCA. Chronic diastolic heart failure. Echo 05/23/2018: EF 60 to 65%.  Moderate focal basal hypertrophy of the septum.  Grade I DD.  Mild AI.  Mild MR. Carotid artery stenosis. Carotid ultrasound 01/27/2023: 40 to 59% right ICA. 1 to 39% left ICA. Hypertension. Hyperlipidemia. Lipid panel 04/19/2023: LDL 72, HDL 65, TG 184, total 163. CVA. T2DM. GERD.     History of Present Illness    [name] is followed by Dr. Jens Som for the above outlined history.  Patient was last evaluated by Corine Shelter, PA-C via video visit on 09/23/2020 for routine follow-up.  Patient reported hypertension with preference for management by PCP.  She was stable from a cardiac standpoint and no changes were made.  She has not been seen since.  Today, patient is accompanied by her daughter. She is doing well. Patient denies shortness of breath or dyspnea on exertion. No chest pain, pressure, or tightness. Denies lower extremity edema, orthopnea, or PND. No palpitations.  She cares for her husband who has Alzheimer disease. She stays active around her home doing laundry and light housekeeping. She uses a rolling walker for stability. She mentions that in the mornings she feels as though she has "brain fog." Upon further questioning it seems she is describing unsteadiness and her daughter agrees. Her daughter wonders if the Repatha is causing this. Reviewing medication list I mentioned that ativan could be causing her morning symptoms. She is going to discuss this with her PCP.     ROS: All other systems  reviewed and are otherwise negative except as noted in History of Present Illness.  Studies Reviewed    ECG personally reviewed by me today: NSR with PACs, 82 bpm.   No significant changes from 08/21/2023.      Physical Exam    VS:  BP 108/78 (BP Location: Left Arm, Patient Position: Sitting, Cuff Size: Normal)   Pulse 82   Ht 4\' 9"  (1.448 m)   Wt 133 lb (60.3 kg)   SpO2 99%   BMI 28.78 kg/m  , BMI Body mass index is 28.78 kg/m.  GEN: Well nourished, well developed, in no acute distress. Neck: No JVD or carotid bruits. Cardiac:  RRR. No murmurs. No rubs or gallops.   Respiratory:  Respirations regular and unlabored. Clear to auscultation without rales, wheezing or rhonchi. GI: Soft, nontender, nondistended. Extremities: Radials/DP/PT 2+ and equal bilaterally. No clubbing or cyanosis. No edema.  Skin: Warm and dry, no rash. Neuro: Strength intact.  Assessment & Plan    Coronary artery calcifications.  Scattered coronary artery calcifications LAD and RCA found on CT abdomen pelvis July 2017.  Patient denies chest pain, tightness, pressure. She is active around her home and caring for her husband who has Alzheimer.  Continue aspirin, Repatha, Zetia. Chronic diastolic heart failure.  Echo May 2019 showed normal LV function, Grade I DD.  Patient denies lower extremity edema, orthopnea or PND.  Euvolemic and well compensated on exam.  Continue losartan. Carotid artery stenosis.  Carotid ultrasound February 2024 40 to 59% right ICA, 1 to 39% left ICA.  Patient is followed by vascular surgery. Hypertension. BP today 108/78. Patient denies dizziness. She was having a hard time with her eyes despite cataract surgery. She got a new pair of glasses recently and feels this has improved things.  Continue losartan.  Managed by PCP. Hyperlipidemia.  LDL April 2024 72.  Continue Repatha and Zetia.  Disposition: Return in 1 year or sooner as needed.         Signed, Etta Grandchild. Kanon Novosel, DNP,  NP-C

## 2023-06-09 ENCOUNTER — Encounter: Payer: Self-pay | Admitting: Student

## 2023-06-09 ENCOUNTER — Ambulatory Visit: Payer: Medicare Other | Attending: Student | Admitting: Student

## 2023-06-09 VITALS — BP 108/78 | HR 82 | Ht <= 58 in | Wt 133.0 lb

## 2023-06-09 DIAGNOSIS — E785 Hyperlipidemia, unspecified: Secondary | ICD-10-CM

## 2023-06-09 DIAGNOSIS — I6523 Occlusion and stenosis of bilateral carotid arteries: Secondary | ICD-10-CM | POA: Diagnosis present

## 2023-06-09 DIAGNOSIS — I2584 Coronary atherosclerosis due to calcified coronary lesion: Secondary | ICD-10-CM | POA: Diagnosis present

## 2023-06-09 DIAGNOSIS — I5032 Chronic diastolic (congestive) heart failure: Secondary | ICD-10-CM | POA: Diagnosis present

## 2023-06-09 DIAGNOSIS — I2510 Atherosclerotic heart disease of native coronary artery without angina pectoris: Secondary | ICD-10-CM | POA: Diagnosis present

## 2023-06-09 DIAGNOSIS — I10 Essential (primary) hypertension: Secondary | ICD-10-CM | POA: Diagnosis present

## 2023-06-09 NOTE — Patient Instructions (Signed)
Medication Instructions:  Your physician recommends that you continue on your current medications as directed. Please refer to the Current Medication list given to you today.  *If you need a refill on your cardiac medications before your next appointment, please call your pharmacy*   Lab Work: NONE If you have labs (blood work) drawn today and your tests are completely normal, you will receive your results only by: MyChart Message (if you have MyChart) OR A paper copy in the mail If you have any lab test that is abnormal or we need to change your treatment, we will call you to review the results.   Testing/Procedures: NONE   Follow-Up: At [facility], you and your health needs are our priority.  As part of our continuing mission to provide you with exceptional heart care, we have created designated Provider Care Teams.  These Care Teams include your primary Cardiologist (physician) and Advanced Practice Providers (APPs -  Physician Assistants and Nurse Practitioners) who all work together to provide you with the care you need, when you need it.  We recommend signing up for the patient portal called "MyChart".  Sign up information is provided on this After Visit Summary.  MyChart is used to connect with patients for Virtual Visits (Telemedicine).  Patients are able to view lab/test results, encounter notes, upcoming appointments, etc.  Non-urgent messages can be sent to your provider as well.   To learn more about what you can do with MyChart, go to [email or web address].    Your next appointment:   1 year(s)  Provider:   Brian Crenshaw, MD    

## 2024-02-14 ENCOUNTER — Telehealth: Payer: Self-pay

## 2024-02-14 NOTE — Telephone Encounter (Signed)
 Daughter is requesting another Rt Shoulder injection for her mother. Last one was in 2022. It has lasted a long time and gave her great relief until recently. It is hurting to raise her arm and even when not moving. The daughter is going out of the country. Can she have an OV and the injection on the same day?

## 2024-02-22 ENCOUNTER — Ambulatory Visit (INDEPENDENT_AMBULATORY_CARE_PROVIDER_SITE_OTHER): Payer: Medicare Other | Admitting: Physical Medicine and Rehabilitation

## 2024-02-22 ENCOUNTER — Other Ambulatory Visit: Payer: Self-pay

## 2024-02-22 ENCOUNTER — Encounter: Payer: Self-pay | Admitting: Physical Medicine and Rehabilitation

## 2024-02-22 VITALS — BP 188/98 | HR 93

## 2024-02-22 DIAGNOSIS — M4802 Spinal stenosis, cervical region: Secondary | ICD-10-CM | POA: Diagnosis not present

## 2024-02-22 DIAGNOSIS — M25511 Pain in right shoulder: Secondary | ICD-10-CM | POA: Diagnosis not present

## 2024-02-22 DIAGNOSIS — M542 Cervicalgia: Secondary | ICD-10-CM

## 2024-02-22 DIAGNOSIS — M47812 Spondylosis without myelopathy or radiculopathy, cervical region: Secondary | ICD-10-CM

## 2024-02-22 DIAGNOSIS — M7918 Myalgia, other site: Secondary | ICD-10-CM

## 2024-02-22 DIAGNOSIS — M19011 Primary osteoarthritis, right shoulder: Secondary | ICD-10-CM | POA: Diagnosis not present

## 2024-02-22 DIAGNOSIS — G8929 Other chronic pain: Secondary | ICD-10-CM

## 2024-02-22 NOTE — Progress Notes (Unsigned)
 [name] - 88 y.o. female MRN [id number]  Date of birth: [birth year]  Office Visit Note: Visit Date: 02/22/2024 PCP: Mosetta Putt, MD Referred by: Mosetta Putt, MD  Subjective: Chief Complaint  Patient presents with   Right Shoulder - Pain   HPI: [name] is a 88 y.o. female who comes in todayHPI   ROS Otherwise per HPI.  Assessment & Plan: Visit Diagnoses:    ICD-10-CM   1. Chronic right shoulder pain  M25.511    G89.29     2. Primary osteoarthritis, right shoulder  M19.011     3. Spinal stenosis of cervical region  M48.02        Plan: No additional findings.   Meds & Orders: No orders of the defined types were placed in this encounter.  No orders of the defined types were placed in this encounter.   Follow-up: No follow-ups on file.   Procedures: No procedures performed      Clinical History: RIGHT SHOULDER - 2+ VIEW   COMPARISON:  Right shoulder x-rays dated March 30, 2022.   FINDINGS: No acute fracture or dislocation. Unchanged high-riding humeral head. Postsurgical changes of the acromioclavicular joint. Unchanged mild glenohumeral osteoarthritis. Soft tissues are unremarkable.   IMPRESSION: 1. Unchanged mild glenohumeral osteoarthritis. No acute osseous abnormality. 2. Unchanged high-riding humeral head related to chronic rotator cuff tear.     Electronically Signed   By: Obie Dredge M.D.   On: 02/25/2023 11:25 ------------------------------------ MRI CERVICAL SPINE WITHOUT CONTRAST   TECHNIQUE: Multiplanar, multisequence MR imaging of the cervical spine was performed. No intravenous contrast was administered.   COMPARISON:  Prior cervical CT myelogram 01/15/2016   FINDINGS: Alignment: Mild fused grade 1 retrolisthesis at C3-C4. Mild fused grade 1 retrolisthesis at C5-C6. Mild fused grade 1 anterolisthesis at C6-C7 and C7-T1.   Vertebrae: Vertebral body height is maintained. Susceptibility artifact from ventral  plates at the O9-G2 and C5-C6 levels. Solid fusion at the C3-C5 levels and C6-T1 levels.   Cord: No spinal cord signal abnormality.   Posterior Fossa, vertebral arteries, paraspinal tissues: Cerebellar atrophy. Flow voids preserved within the imaged cervical vertebral arteries. Paraspinal soft tissues within normal limits.   Disc levels:   Unless otherwise stated, the level by level findings below have not significantly changed since prior cervical CT myelogram 01/15/2016.   As before, there is advanced disc degeneration at T1-T2 and T2-T3.   C2-C3: No significant disc herniation. Facet arthrosis (advanced on the right). No significant spinal canal stenosis. Mild right neural foraminal narrowing.   C3-C4: Prior ACDF with fusion across the disc space. Vertebral osteophytosis. Facet hypertrophy. Moderate central canal stenosis with slight flattening of the ventral cord. Bilateral neural foraminal narrowing (mild right, moderate left).   C4-C5: Fusion across the disc space. Vertebral osteophytosis. Facet hypertrophy. No significant spinal canal stenosis. Mild right neural foraminal narrowing.   C5-C6: Prior ACDF. Redemonstrated pseudoarthrosis. Vertebral osteophytosis. Facet hypertrophy. No significant spinal canal stenosis. Bilateral neural foraminal narrowing (mild right, severe left).   C6-C7: Fusion across the disc space. Vertebral osteophytosis. Facet hypertrophy. No significant spinal canal or foraminal stenosis.   C7-T1: Fusion across the disc space. Vertebral osteophytosis. Facet hypertrophy. No significant spinal canal or foraminal stenosis.   T1-T2: Posterior disc osteophyte complex with bilateral disc osteophyte ridge. Facet hypertrophy. Mild relative spinal canal narrowing. Mild/moderate bilateral neural foraminal narrowing (greater on the right).   IMPRESSION: Postsurgical changes and spondylosis of the cervical spine have not significantly changed as compared  to cervical CT myelogram performed 01/15/2016. Findings are most notably as follows.   At C3-C4, vertebral osteophytosis and facet hypertrophy contribute to moderate central canal stenosis with slight flattening of the ventral cord. Bilateral neural foraminal narrowing (mild right, moderate left).   Additional sites of neural foraminal narrowing as described and greatest on the left at C5-C6 (severe at this site).   Redemonstrated C5-C6 pseudoarthrosis.     Electronically Signed   By: Jackey Loge DO   On: 06/28/2020 17:18   She reports that she quit smoking about 45 years ago. Her smoking use included cigarettes. She started smoking about 48 years ago. She has a 3 pack-year smoking history. She has never used smokeless tobacco. No results for input(s): "HGBA1C", "LABURIC" in the last 8760 hours.  Objective:  VS:  HT:    WT:   BMI:     BP:(!) 188/98  HR:93bpm  TEMP: ( )  RESP:  Physical Exam  Ortho Exam  Imaging: No results found.  Past Medical/Family/Surgical/Social History: Medications & Allergies reviewed per EMR, new medications updated. Patient Active Problem List   Diagnosis Date Noted   Migraine with aura and with status migrainosus, not intractable 12/16/2022   History of stroke 09/23/2020   CAD (coronary artery disease) 08/12/2016   Hyperlipidemia 01/02/2016   Essential hypertension 01/02/2016   Aortic insufficiency 01/02/2016   Dizziness 09/10/2015   Carotid artery stenosis 10/03/2012   DEEP VENOUS THROMBOPHLEBITIS, LEG, LEFT 03/02/2010   DEGENERATIVE DISC DISEASE, CERVICAL SPINE 03/02/2010   DEGENERATIVE DISC DISEASE, LUMBAR SPINE 03/02/2010   SHINGLES 02/08/2010   DISCITIS 02/08/2010   Type II diabetes mellitus with renal manifestations (HCC) 12/2009   Hyponatremia 12/2009   Generalized weakness 12/2009   Leukocytosis 12/2009   Chronic diastolic CHF (congestive heart failure) (HCC) 12/2009   GERD (gastroesophageal reflux disease) 12/2009   CKD  (chronic kidney disease), stage III 12/2009   HLD (hyperlipidemia) 12/2009   Past Medical History:  Diagnosis Date   Allergy    Carotid artery occlusion    Chronic diastolic CHF (congestive heart failure) (HCC) 12/2009   Diabetes mellitus without complication (HCC)    History of shingles 11/2008   Hyperlipidemia    Hypertension    Kidney stones 2012   Lipoma    L eyelid   Osteoarthritis of cervical spine    Stroke (HCC) 12/2009   Family History  Problem Relation Age of Onset   Dementia Mother    Heart attack Father    Past Surgical History:  Procedure Laterality Date   ABDOMINAL HYSTERECTOMY  1973   ANKLE RECONSTRUCTION  1990   right - 5 screws & plate   CHOLECYSTECTOMY  1988   FOOT FUSION  2005   left - 4 screws   GALLBLADDER SURGERY  1988   large stone blockage   HERNIA REPAIR  1963   left side   LUMBAR DISC SURGERY  1975   LUMBAR EPIDURAL INJECTION  May-Aug-Sept. 2015/ 08-29-15   ROTATOR CUFF REPAIR  1994   right   spinal fusion surgery  2000   C4 and C5   spinal fusion surgery  2003, 2010   C6 and C7   SPINE SURGERY  2000   C4-C5 fusion   SPINE SURGERY  2003   C6-C7 fusion   SPINE SURGERY  2010   replace broken plate (cervical region)   STOMACH SURGERY  2002   mass rt & left (not cancerous)   Social History   Occupational History  Not on file  Tobacco Use   Smoking status: Former    Current packs/day: 0.00    Average packs/day: 1 pack/day for 3.0 years (3.0 ttl pk-yrs)    Types: Cigarettes    Start date: 02/09/1976    Quit date: 02/08/1979    Years since quitting: 45.0   Smokeless tobacco: Never  Vaping Use   Vaping status: Never Used  Substance and Sexual Activity   Alcohol use: No   Drug use: No   Sexual activity: Not on file

## 2024-02-22 NOTE — Patient Instructions (Signed)

## 2024-02-22 NOTE — Progress Notes (Unsigned)
 Pain Score:   8

## 2024-02-23 MED ORDER — BUPIVACAINE HCL 0.25 % IJ SOLN
5.0000 mL | INTRAMUSCULAR | Status: AC | PRN
  Administered 2024-02-22: 5 mL via INTRA_ARTICULAR

## 2024-02-23 MED ORDER — TRIAMCINOLONE ACETONIDE 40 MG/ML IJ SUSP
40.0000 mg | INTRAMUSCULAR | Status: AC | PRN
  Administered 2024-02-22: 40 mg via INTRA_ARTICULAR

## 2024-03-02 ENCOUNTER — Telehealth: Payer: Self-pay | Admitting: Cardiology

## 2024-03-02 MED ORDER — REPATHA SURECLICK 140 MG/ML ~~LOC~~ SOAJ: 140.0000 mg | SUBCUTANEOUS | 3 refills | Status: DC

## 2024-03-02 NOTE — Telephone Encounter (Signed)
 Called and spoke to [facility] pharmacy  Pharmacy states:   -Repatha 420 push no longer carried in [facility]  -unsure if other pharmacies will have the 420 push  -[facility] Repatha 140

## 2024-03-02 NOTE — Telephone Encounter (Signed)
 Patient identification verified by 2 forms. Marilynn Rail, RN    Called and spoke to patients daughter Kymme  Informed Kymme:   -Rx changed to Repatha 140mg  every 2 weeks    -Rx sent to [facility] today  Kymme states she just picked up RX  Kymme has no further questions or concerns at this time

## 2024-03-02 NOTE — Telephone Encounter (Signed)
 Pt c/o medication issue:  1. Name of Medication:   Evolocumab (REPATHA SURECLICK) 140 MG/ML SOAJ    2. How are you currently taking this medication (dosage and times per day)?    3. Are you having a reaction (difficulty breathing--STAT)? no  4. What is your medication issue? Calling to see if our office have any samples. Daughter states the pharmacy told her that they are no longer making he push injection. Daughter is leaving for next two months need to make sure the patient would be able to have her medication. Please advise

## 2024-03-02 NOTE — Telephone Encounter (Signed)
 Spoke with [name] at Dr. Geoffery Lyons office.  She called because pt was confused about inability to get Repatha.  Explained that the 420 mg no longer being manufactured, will switch to 140 every 14 days.  She will review with patient and daughter.  (No latex allergy noted)

## 2024-03-02 NOTE — Telephone Encounter (Signed)
 Rx was sent to [facility] at 2 pm this afternoon

## 2024-03-02 NOTE — Telephone Encounter (Signed)
 Pt c/o medication issue:  1. Name of Medication:   Evolocumab with Infusor (REPATHA PUSHTRONEX SYSTEM) 420 MG/3.5ML SOCT    2. How are you currently taking this medication (dosage and times per day)? As written   3. Are you having a reaction (difficulty breathing--STAT)? No   4. What is your medication issue? Pt daughter called in stating they do not make this medication anymore, please advise if this can be changed to a different type and sent over to pharmacy. She states she is leaving out of the country tomorrow and no one else gets pt's medications but her so she would need to try and get it today. Please advise.

## 2024-04-24 ENCOUNTER — Emergency Department (HOSPITAL_BASED_OUTPATIENT_CLINIC_OR_DEPARTMENT_OTHER)
Admission: EM | Admit: 2024-04-24 | Discharge: 2024-04-24 | Disposition: A | Discharge status: Home / Self Care | Attending: Emergency Medicine | Admitting: Emergency Medicine

## 2024-04-24 ENCOUNTER — Emergency Department (HOSPITAL_BASED_OUTPATIENT_CLINIC_OR_DEPARTMENT_OTHER)

## 2024-04-24 ENCOUNTER — Encounter (HOSPITAL_BASED_OUTPATIENT_CLINIC_OR_DEPARTMENT_OTHER): Payer: Self-pay | Admitting: Emergency Medicine

## 2024-04-24 DIAGNOSIS — Z7982 Long term (current) use of aspirin: Secondary | ICD-10-CM | POA: Insufficient documentation

## 2024-04-24 DIAGNOSIS — R519 Headache, unspecified: Secondary | ICD-10-CM | POA: Diagnosis not present

## 2024-04-24 DIAGNOSIS — I130 Hypertensive heart and chronic kidney disease with heart failure and stage 1 through stage 4 chronic kidney disease, or unspecified chronic kidney disease: Secondary | ICD-10-CM | POA: Insufficient documentation

## 2024-04-24 DIAGNOSIS — R251 Tremor, unspecified: Secondary | ICD-10-CM | POA: Diagnosis not present

## 2024-04-24 DIAGNOSIS — I509 Heart failure, unspecified: Secondary | ICD-10-CM | POA: Diagnosis not present

## 2024-04-24 DIAGNOSIS — N189 Chronic kidney disease, unspecified: Secondary | ICD-10-CM | POA: Diagnosis not present

## 2024-04-24 DIAGNOSIS — Z79899 Other long term (current) drug therapy: Secondary | ICD-10-CM | POA: Insufficient documentation

## 2024-04-24 DIAGNOSIS — E1122 Type 2 diabetes mellitus with diabetic chronic kidney disease: Secondary | ICD-10-CM | POA: Insufficient documentation

## 2024-04-24 DIAGNOSIS — M62838 Other muscle spasm: Secondary | ICD-10-CM | POA: Insufficient documentation

## 2024-04-24 DIAGNOSIS — I2510 Atherosclerotic heart disease of native coronary artery without angina pectoris: Secondary | ICD-10-CM | POA: Insufficient documentation

## 2024-04-24 DIAGNOSIS — H5500 Unspecified nystagmus: Secondary | ICD-10-CM | POA: Diagnosis not present

## 2024-04-24 DIAGNOSIS — M542 Cervicalgia: Secondary | ICD-10-CM | POA: Diagnosis present

## 2024-04-24 LAB — CBC WITH DIFFERENTIAL/PLATELET
Abs Immature Granulocytes: 0.02 10*3/uL (ref 0.00–0.07)
Basophils Absolute: 0 10*3/uL (ref 0.0–0.1)
Basophils Relative: 0 %
Eosinophils Absolute: 0.2 10*3/uL (ref 0.0–0.5)
Eosinophils Relative: 2 %
HCT: 38.3 % (ref 36.0–46.0)
Hemoglobin: 13.1 g/dL (ref 12.0–15.0)
Immature Granulocytes: 0 %
Lymphocytes Relative: 24 %
Lymphs Abs: 2 10*3/uL (ref 0.7–4.0)
MCH: 32.2 pg (ref 26.0–34.0)
MCHC: 34.2 g/dL (ref 30.0–36.0)
MCV: 94.1 fL (ref 80.0–100.0)
Monocytes Absolute: 0.6 10*3/uL (ref 0.1–1.0)
Monocytes Relative: 7 %
Neutro Abs: 5.8 10*3/uL (ref 1.7–7.7)
Neutrophils Relative %: 67 %
Platelets: 219 10*3/uL (ref 150–400)
RBC: 4.07 MIL/uL (ref 3.87–5.11)
RDW: 12.3 % (ref 11.5–15.5)
WBC: 8.6 10*3/uL (ref 4.0–10.5)
nRBC: 0 % (ref 0.0–0.2)

## 2024-04-24 LAB — COMPREHENSIVE METABOLIC PANEL WITH GFR
ALT: 18 U/L (ref 0–44)
AST: 27 U/L (ref 15–41)
Albumin: 4.4 g/dL (ref 3.5–5.0)
Alkaline Phosphatase: 71 U/L (ref 38–126)
Anion gap: 12 (ref 5–15)
BUN: 20 mg/dL (ref 8–23)
CO2: 24 mmol/L (ref 22–32)
Calcium: 9.8 mg/dL (ref 8.9–10.3)
Chloride: 97 mmol/L — ABNORMAL LOW (ref 98–111)
Creatinine, Ser: 1.29 mg/dL — ABNORMAL HIGH (ref 0.44–1.00)
GFR, Estimated: 40 mL/min — ABNORMAL LOW (ref >60)
Glucose, Bld: 104 mg/dL — ABNORMAL HIGH (ref 70–99)
Potassium: 5.2 mmol/L — ABNORMAL HIGH (ref 3.5–5.1)
Sodium: 133 mmol/L — ABNORMAL LOW (ref 135–145)
Total Bilirubin: 0.2 mg/dL (ref 0.0–1.2)
Total Protein: 6.1 g/dL — ABNORMAL LOW (ref 6.5–8.1)

## 2024-04-24 LAB — TSH: TSH: 1.45 u[IU]/mL (ref 0.350–4.500)

## 2024-04-24 MED ORDER — FENTANYL CITRATE PF 50 MCG/ML IJ SOSY
50.0000 ug | PREFILLED_SYRINGE | Freq: Once | INTRAMUSCULAR | Status: AC
  Administered 2024-04-24: 50 ug via INTRAVENOUS
  Filled 2024-04-24: qty 1

## 2024-04-24 MED ORDER — IOHEXOL 350 MG/ML SOLN
50.0000 mL | Freq: Once | INTRAVENOUS | Status: AC | PRN
  Administered 2024-04-24: 50 mL via INTRAVENOUS

## 2024-04-24 MED ORDER — LIDOCAINE 5 % EX PTCH: 1.0000 | MEDICATED_PATCH | CUTANEOUS | 0 refills | Status: AC

## 2024-04-24 MED ORDER — PROCHLORPERAZINE EDISYLATE 10 MG/2ML IJ SOLN
10.0000 mg | Freq: Once | INTRAMUSCULAR | Status: AC
  Administered 2024-04-24: 10 mg via INTRAVENOUS
  Filled 2024-04-24: qty 2

## 2024-04-24 MED ORDER — IOHEXOL 350 MG/ML SOLN
100.0000 mL | Freq: Once | INTRAVENOUS | Status: AC | PRN
  Administered 2024-04-24: 100 mL via INTRAVENOUS

## 2024-04-24 MED ORDER — DIPHENHYDRAMINE HCL 50 MG/ML IJ SOLN
25.0000 mg | Freq: Once | INTRAMUSCULAR | Status: AC
  Administered 2024-04-24: 25 mg via INTRAVENOUS
  Filled 2024-04-24: qty 1

## 2024-04-24 MED ORDER — SODIUM CHLORIDE 0.9 % IV BOLUS
500.0000 mL | Freq: Once | INTRAVENOUS | Status: AC
  Administered 2024-04-24: 500 mL via INTRAVENOUS

## 2024-04-24 MED ORDER — CYCLOBENZAPRINE HCL 5 MG PO TABS: 5.0000 mg | ORAL_TABLET | Freq: Two times a day (BID) | ORAL | 0 refills | Status: DC | PRN

## 2024-04-24 NOTE — Discharge Instructions (Addendum)
 Your history, exam, and workup today did not show evidence of acute stroke but did show evidence of some changes to the vessels in your neck that is evolved sometime in the last 5 years since the last images.  We did not see evidence of acute infection and after medications your symptoms began to improve.  I spoke with neurology who felt you are appropriate for discharge home to follow-up with your outpatient neurology team.  Please call them to schedule follow-up appointment for further management.  Please continue using medications at home including the pain medicine you have leftover from your previous dental surgery but consider adding the low-dose muscle relaxant and numbing patches to help with your muscle spasm and pain on your neck.  The rest of your exam was reassuring and we agreed to hold on transfer for MRI tonight or further workup today.  If any symptoms change or worsen acutely, please return to the nearest emergency department.

## 2024-04-24 NOTE — ED Triage Notes (Signed)
 Neck pain x 3 months Headache x 1 week Not controlled with home meds Took tylenol  at 1:20pm

## 2024-04-24 NOTE — ED Notes (Signed)
 ED Provider at bedside.

## 2024-04-24 NOTE — ED Provider Notes (Signed)
 [facility] AT [facility] Provider Note   CSN: [id number] Arrival date & time: 04/24/24  1446     History  Chief Complaint  Patient presents with   Headache   Neck Pain    [name] is a 88 y.o. female.  The history is provided by the patient, medical records and a relative. No language interpreter was used.  Headache Pain location:  Occipital and frontal Quality:  Dull and sharp Radiates to:  R neck Severity currently:  8/10 Severity at highest:  8/10 Onset quality:  Gradual Duration:  2 weeks Timing:  Constant Progression:  Waxing and waning Chronicity:  New Context: bright light   Relieved by:  Nothing Worsened by:  Nothing Ineffective treatments:  Acetaminophen  Associated symptoms: neck pain and photophobia   Associated symptoms: no abdominal pain, no back pain, no blurred vision, no congestion, no cough, no diarrhea, no dizziness, no drainage, no ear pain, no eye pain, no fatigue, no fever, no focal weakness, no loss of balance, no nausea, no near-syncope, no neck stiffness, no numbness, no paresthesias, no seizures, no syncope, no visual change, no vomiting and no weakness   Neck Pain Associated symptoms: headaches and photophobia   Associated symptoms: no chest pain, no fever, no numbness, no syncope, no visual change and no weakness        Home Medications Prior to Admission medications   Medication Sig Start Date End Date Taking? Authorizing Provider  acetaminophen  (TYLENOL ) 500 MG tablet Take 500-1,000 mg by mouth every 6 (six) hours as needed for mild pain.    [provider]  aspirin  81 MG tablet Take 81 mg by mouth daily. Per patient, takes one 81 mg daily    [provider]  CALCIUM  CITRATE PO Take 1 tablet by mouth daily.    [provider]  carboxymethylcellulose (REFRESH PLUS) 0.5 % SOLN Place 1 drop into both eyes 3 (three) times daily as needed (dry eyes).     [provider]   Evolocumab  (REPATHA  SURECLICK) 140 MG/ML SOAJ Inject 140 mg into the skin every 14 (fourteen) days. 03/02/24   Lenise Quince, MD  ezetimibe  (ZETIA ) 10 MG tablet Take 10 mg by mouth daily. 05/03/23   [provider]  LORazepam  (ATIVAN ) 1 MG tablet Take 1 tablet (1 mg total) by mouth at bedtime. 09/13/19   Deforest Fast, MD  losartan  (COZAAR ) 100 MG tablet Take 100 mg by mouth daily.    [provider]  meloxicam (MOBIC) 7.5 MG tablet Take 7.5 mg by mouth daily.    [provider]  Multiple Vitamin (MULTIVITAMIN) tablet Take 1 tablet by mouth daily.    [provider]  Omega-3 Fatty Acids (FISH OIL) 1000 MG CPDR Take 1,000 mg by mouth in the morning and at bedtime.    [provider]  omeprazole (PRILOSEC) 20 MG capsule Take 20 mg by mouth daily. 09/08/12   [provider]  polyethylene glycol (MIRALAX  / GLYCOLAX ) packet Take 17 g by mouth daily as needed for mild constipation.     [provider]  valACYclovir (VALTREX) 1000 MG tablet Take 1,000 mg by mouth 2 (two) times daily. 05/03/23   [provider]  venlafaxine XR (EFFEXOR-XR) 75 MG 24 hr capsule Take 75 mg by mouth daily. 04/15/20   [provider]  vitamin B-12 (CYANOCOBALAMIN ) 1000 MCG tablet Take 1,000 mcg by mouth daily.    [provider]  VOLTAREN  1 % GEL Apply  2 g topically daily as needed (joint pain). For pain 09/04/12   [provider]      Allergies    Plavix [clopidogrel bisulfate], Prinivil [lisinopril], Sulfa antibiotics, Sulfasalazine, Codeine, Metronidazole , and Clopidogrel    Review of Systems   Review of Systems  Constitutional:  Negative for chills, fatigue and fever.  HENT:  Negative for congestion, ear pain and postnasal drip.   Eyes:  Positive for photophobia. Negative for blurred vision, pain and visual disturbance.  Respiratory:  Negative for cough, chest tightness, shortness of breath and wheezing.   Cardiovascular:   Negative for chest pain, palpitations, leg swelling, syncope and near-syncope.  Gastrointestinal:  Negative for abdominal pain, diarrhea, nausea and vomiting.  Genitourinary:  Negative for dysuria, flank pain and frequency.  Musculoskeletal:  Positive for neck pain. Negative for back pain and neck stiffness.  Skin:  Negative for rash and wound.  Neurological:  Positive for tremors and headaches. Negative for dizziness, focal weakness, seizures, facial asymmetry, speech difficulty, weakness, light-headedness, numbness, paresthesias and loss of balance.  Psychiatric/Behavioral:  Negative for agitation and confusion.   All other systems reviewed and are negative.   Physical Exam Updated Vital Signs BP (!) 149/57   Pulse 77   Temp 98.7 F (37.1 C) (Oral)   Resp 20   SpO2 96%  Physical Exam Vitals and nursing note reviewed.  Constitutional:      General: She is not in acute distress.    Appearance: She is well-developed. She is not ill-appearing, toxic-appearing or diaphoretic.  HENT:     Head: Normocephalic and atraumatic.  Eyes:     Extraocular Movements: Extraocular movements intact.     Right eye: Nystagmus present.     Left eye: Nystagmus present.     Conjunctiva/sclera: Conjunctivae normal.     Pupils: Pupils are equal, round, and reactive to light.  Cardiovascular:     Rate and Rhythm: Normal rate and regular rhythm.     Heart sounds: Normal heart sounds. No murmur heard. Pulmonary:     Effort: Pulmonary effort is normal. No respiratory distress.     Breath sounds: Normal breath sounds. No wheezing, rhonchi or rales.  Chest:     Chest wall: No tenderness.  Abdominal:     Palpations: Abdomen is soft.     Tenderness: There is no abdominal tenderness.  Musculoskeletal:        General: No swelling.     Cervical back: Neck supple.  Skin:    General: Skin is warm and dry.     Capillary Refill: Capillary refill takes less than 2 seconds.     Coloration: Skin is not pale.      Findings: No rash.  Neurological:     Mental Status: She is alert.     Cranial Nerves: No cranial nerve deficit, dysarthria or facial asymmetry.     Sensory: No sensory deficit.     Motor: Tremor (slight) present. No abnormal muscle tone or seizure activity.     Coordination: Finger-Nose-Finger Test normal.  Psychiatric:        Mood and Affect: Mood normal.     ED Results / Procedures / Treatments   Labs (all labs ordered are listed, but only abnormal results are displayed) Labs Reviewed  COMPREHENSIVE METABOLIC PANEL WITH GFR - Abnormal; Notable for the following components:      Result Value   Sodium 133 (*)    Potassium 5.2 (*)    Chloride 97 (*)  Glucose, Bld 104 (*)    Creatinine, Ser 1.29 (*)    Total Protein 6.1 (*)    GFR, Estimated 40 (*)    All other components within normal limits  CBC WITH DIFFERENTIAL/PLATELET  TSH    EKG None  Radiology CT Angio Neck W and/or Wo Contrast Result Date: 04/24/2024 CLINICAL DATA:  Neck pain for 3 months, headache for 1 week. Right vertebral artery occlusion on CTA. EXAM: CT ANGIOGRAPHY NECK TECHNIQUE: Multidetector CT imaging of the neck was performed using the standard protocol during bolus administration of intravenous contrast. Multiplanar CT image reconstructions and MIPs were obtained to evaluate the vascular anatomy. Carotid stenosis measurements (when applicable) are obtained utilizing NASCET criteria, using the distal internal carotid diameter as the denominator. RADIATION DOSE REDUCTION: This exam was performed according to the departmental dose-optimization program which includes automated exposure control, adjustment of the mA and/or kV according to patient size and/or use of iterative reconstruction technique. CONTRAST:  50mL OMNIPAQUE  IOHEXOL  350 MG/ML SOLN COMPARISON:  Same day CTA head and CT venogram head. MRI head 08/20/2022, CTA head 01/31/2019. FINDINGS: Aortic arch: Standard configuration of the aortic arch. Imaged  portion shows no evidence of aneurysm or dissection. Moderate atherosclerosis of the visualized aortic arch. No significant stenosis of the major arch vessel origins. Pulmonary arteries: As permitted by contrast timing, there are no filling defects in the visualized pulmonary arteries. Subclavian arteries: Patent bilaterally. Atherosclerosis at the origin of the left subclavian artery resulting in mild stenosis. Additional atherosclerosis of the left subclavian artery adjacent to the origin of the left vertebral artery. Right carotid system: Patent from the origin to the skull base. Calcified and noncalcified atherosclerotic plaque at the carotid bifurcation resulting in approximately 60% stenosis at the origin of the right cervical ICA. Left carotid system: Patent from the origin to the skull base. Calcified atherosclerosis at the carotid bifurcation with less than 50% stenosis. Tortuosity of the mid cervical ICA. Vertebral arteries: Left vertebral artery is dominant. Atherosclerosis involving the origin of the non dominant right vertebral artery resulting in moderate stenosis. The right vertebral artery is patent from its origin to the intracranial segment. Atherosclerosis adjacent to the origin of the left vertebral artery without significant stenosis. Tortuosity of the left V1 segment. No evidence of dissection within the extracranial vertebral arteries. Skeleton: No acute findings. Degenerative changes in the cervical spine. Anterior cervical fusion hardware at C3-4 and C5-6. Interbody spacers at multiple levels. Facet arthrosis most pronounced on the right at C2-3. Multiple chronically absent mandibular and maxillary teeth. Dehiscence of the floor of the left maxillary sinus with adjacent odontogenic mucosal thickening. Completely opacified right maxillary sinus. Other neck: The visualized airway is patent. No cervical lymphadenopathy. Upper chest: Visualized lung apices are clear. Review of the MIP images  confirms the above findings IMPRESSION: The non dominant right vertebral artery is patent from the origin to the intracranial segment. No evidence of dissection of the extracranial vertebral arteries. Atherosclerosis at the origin of the right vertebral artery resulting in moderate stenosis. Atherosclerosis at the carotid bifurcations. Approximately 60% stenosis at the origin of the right cervical ICA. Degenerative changes and postsurgical changes of the cervical spine as above. Periapical lucency and dehiscence of the floor of the left maxillary sinus with adjacent odontogenic mucosal thickening. Complete opacification of the right maxillary sinus. Aortic Atherosclerosis (ICD10-I70.0). Electronically Signed   By: Denny Flack M.D.   On: 04/24/2024 21:20   CT Angio Head W or Wo Contrast Result Date: 04/24/2024  CLINICAL DATA:  Increasing frequency or severity of headaches. Severe neck pain. Prior neck surgery and right dental surgery. EXAM: CT ANGIOGRAPHY HEAD CT VENOGRAM HEAD TECHNIQUE: Multidetector CT imaging of the head was performed using the standard protocol during bolus administration of intravenous contrast. Multiplanar CT image reconstructions and MIPs were obtained to evaluate the vascular anatomy. Venographic phase images of the brain were obtained following the administration of intravenous contrast. Multiplanar reformats and maximum intensity projections were generated. RADIATION DOSE REDUCTION: This exam was performed according to the departmental dose-optimization program which includes automated exposure control, adjustment of the mA and/or kV according to patient size and/or use of iterative reconstruction technique. CONTRAST:  OMNIPAQUE  IOHEXOL  350 MG/ML SOLN COMPARISON:  MRI head and CT head 08/20/2022.  CTA head 01/31/2019. FINDINGS: CT HEAD FINDINGS Brain: No acute intracranial hemorrhage. No CT evidence of acute infarct. Nonspecific hypoattenuation in the periventricular and  subcortical white matter favored to reflect chronic microvascular ischemic changes. 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 Mild parenchymal volume loss. No edema, mass effect, or midline shift. The basilar cisterns are patent. Ventricles: Prominence of the ventricles suggestive of underlying parenchymal volume loss. Vascular: No hyperdense vessel. Intracranial atherosclerotic calcifications noted. Skull: No acute or aggressive finding. Sinuses/orbits: Orbits are symmetric. Complete opacification of the right maxillary sinus. Mild mucosal thickening in the right ethmoid sinus. Other: Mastoid air cells are clear. CTA HEAD FINDINGS ANTERIOR CIRCULATION: The internal carotid arteries are patent from the skull base to the ICA termini. Atherosclerosis of the bilateral carotid siphons which are increased since 2020. There is mild-to-moderate stenosis of the bilateral cavernous ICAs. Additional mild stenosis of the left supraclinoid ICA. Focal moderate narrowing of the right supraclinoid ICA is increased from prior. There is a 1 mm anteriorly projecting outpouching along the right supraclinoid ICA just proximal to the ICA terminus concerning for small aneurysm. MCAs: The middle cerebral arteries are patent bilaterally. ACAs: The anterior cerebral arteries are patent bilaterally. POSTERIOR CIRCULATION: The left vertebral artery is dominant and is visualized from the distal V3 segment to the vertebrobasilar confluence. Mild atherosclerosis of the left V4 segment without significant stenosis. The non dominant right vertebral artery is patent the distal V3 segment and appears diminished in caliber after the origin of the right PICA. The mid right V4 segment appears occluded which is new from prior. PCAs: Patent bilaterally. Moderate narrowing of the P2 segment of the right PCA. Pcomm: Not well visualized. SCAs: The superior cerebellar arteries are patent bilaterally. Basilar artery: Patent. Similar moderate stenosis of  the mid basilar artery. Venous sinuses: The superior sagittal sinus is patent. Visualized cortical veins appear unremarkable. The internal cerebral veins are patent. The vein of Galen and straight sinus are patent. Normal appearance of the confluence of the sinuses. The transverse and sigmoid sinuses are patent bilaterally. The right transverse sinus is slightly larger in caliber compared to the left, likely congenital. Normal appearance of the visualized jugular bulbs. 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 Consider CTA neck for complete evaluation of the extracranial vertebral artery. Intracranial arterial vasculature is otherwise patent. No CT evidence of acute intracranial abnormality. Atherosclerosis of the carotid siphons which is increased since 2020. Focal moderate stenosis of the right supraclinoid ICA. Mild stenosis of the bilateral cavernous ICAs and left supraclinoid ICA. Similar moderate stenosis of the mid basilar artery. 1  mm outpouching along the right supraclinoid ICA is new from prior concerning for small aneurysm. No evidence of venous sinus thrombosis or venous sinus stenosis. Paranasal sinus disease as above most pronounced in the right maxillary sinus. Electronically Signed   By: Denny Flack M.D.   On: 04/24/2024 19:23   CT VENOGRAM HEAD Result Date: 04/24/2024 CLINICAL DATA:  Increasing frequency or severity of headaches. Severe neck pain. Prior neck surgery and right dental surgery. EXAM: CT ANGIOGRAPHY HEAD CT VENOGRAM HEAD TECHNIQUE: Multidetector CT imaging of the head was performed using the standard protocol during bolus administration of intravenous contrast. Multiplanar CT image reconstructions and MIPs were obtained to evaluate the vascular anatomy. Venographic phase images of the brain were obtained following the  administration of intravenous contrast. Multiplanar reformats and maximum intensity projections were generated. RADIATION DOSE REDUCTION: This exam was performed according to the departmental dose-optimization program which includes automated exposure control, adjustment of the mA and/or kV according to patient size and/or use of iterative reconstruction technique. CONTRAST:  OMNIPAQUE  IOHEXOL  350 MG/ML SOLN COMPARISON:  MRI head and CT head 08/20/2022.  CTA head 01/31/2019. FINDINGS: CT HEAD FINDINGS Brain: No acute intracranial hemorrhage. No CT evidence of acute infarct. Nonspecific hypoattenuation in the periventricular and subcortical white matter favored to reflect chronic microvascular ischemic changes. 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 Mild parenchymal volume loss. No edema, mass effect, or midline shift. The basilar cisterns are patent. Ventricles: Prominence of the ventricles suggestive of underlying parenchymal volume loss. Vascular: No hyperdense vessel. Intracranial atherosclerotic calcifications noted. Skull: No acute or aggressive finding. Sinuses/orbits: Orbits are symmetric. Complete opacification of the right maxillary sinus. Mild mucosal thickening in the right ethmoid sinus. Other: Mastoid air cells are clear. CTA HEAD FINDINGS ANTERIOR CIRCULATION: The internal carotid arteries are patent from the skull base to the ICA termini. Atherosclerosis of the bilateral carotid siphons which are increased since 2020. There is mild-to-moderate stenosis of the bilateral cavernous ICAs. Additional mild stenosis of the left supraclinoid ICA. Focal moderate narrowing of the right supraclinoid ICA is increased from prior. There is a 1 mm anteriorly projecting outpouching along the right supraclinoid ICA just proximal to the ICA terminus concerning for small aneurysm. MCAs: The middle cerebral arteries are patent bilaterally. ACAs: The anterior cerebral arteries are patent bilaterally.  POSTERIOR CIRCULATION: The left vertebral artery is dominant and is visualized from the distal V3 segment to the vertebrobasilar confluence. Mild atherosclerosis of the left V4 segment without significant stenosis. The non dominant right vertebral artery is patent the distal V3 segment and appears diminished in caliber after the origin of the right PICA. The mid right V4 segment appears occluded which is new from prior. PCAs: Patent bilaterally. Moderate narrowing of the P2 segment of the right PCA. Pcomm: Not well visualized. SCAs: The superior cerebellar arteries are patent bilaterally. Basilar artery: Patent. Similar moderate stenosis of the mid basilar artery. Venous sinuses: The superior sagittal sinus is patent. Visualized cortical veins appear unremarkable. The internal cerebral veins are patent. The vein of Galen and straight sinus are patent. Normal appearance of the confluence of the sinuses. The transverse and sigmoid sinuses are patent bilaterally. The right transverse sinus is slightly larger in caliber compared to the left, likely congenital. Normal appearance of the visualized jugular bulbs. 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 Consider CTA neck  for complete evaluation of the extracranial vertebral artery. Intracranial arterial vasculature is otherwise patent. No CT evidence of acute intracranial abnormality. Atherosclerosis of the carotid siphons which is increased since 2020. Focal moderate stenosis of the right supraclinoid ICA. Mild stenosis of the bilateral cavernous ICAs and left supraclinoid ICA. Similar moderate stenosis of the mid basilar artery. 1 mm outpouching along the right supraclinoid ICA is new from prior concerning for small aneurysm. No evidence of venous sinus thrombosis or venous sinus stenosis. Paranasal sinus  disease as above most pronounced in the right maxillary sinus. Electronically Signed   By: Denny Flack M.D.   On: 04/24/2024 19:23    Procedures Procedures    EMERGENCY DEPARTMENT  US  GUIDANCE EXAM Emergency Ultrasound:  US  Guidance for Needle Guidance  INDICATIONS: Difficult vascular access Linear probe used in real-time to visualize location of needle entry through skin.   PERFORMED BY: Myself IMAGES ARCHIVED?: No LIMITATIONS:  scarring small veins VIEWS USED: Transverse INTERPRETATION: Needle visualized within vein, Right arm, and Needle gauge 18   Medications Ordered in ED Medications  sodium chloride  0.9 % bolus 500 mL (0 mLs Intravenous Stopped 04/24/24 1820)  prochlorperazine (COMPAZINE) injection 10 mg (10 mg Intravenous Given 04/24/24 1548)  diphenhydrAMINE (BENADRYL) injection 25 mg (25 mg Intravenous Given 04/24/24 1547)  iohexol  (OMNIPAQUE ) 350 MG/ML injection 100 mL (100 mLs Intravenous Contrast Given 04/24/24 1829)  iohexol  (OMNIPAQUE ) 350 MG/ML injection 50 mL (50 mLs Intravenous Contrast Given 04/24/24 2051)  fentaNYL  (SUBLIMAZE ) injection 50 mcg (50 mcg Intravenous Given 04/24/24 2114)    ED Course/ Medical Decision Making/ A&P                                 Medical Decision Making Amount and/or Complexity of Data Reviewed Labs: ordered. Radiology: ordered.  Risk Prescription drug management.    [name] is a 88 y.o. female with a past medical history significant for CAD, carotid stenosis, hypertension, hyperlipidemia, diabetes, CHF, GERD, CKD, migraines, and previous neck surgery who presents with worsening headache and neck pain.  According to patient, for the last 3 months ever since she had a tooth pulled she has been having pain in her right neck going into her head.  She reports it was bothering her enough to where she had an injection in her shoulder that seem to help the shoulder pain but still having the neck pain.  She reports that over the  last 2 weeks it has evolved and now her pain is severe in the occiput and her head diffusely.  She reports it is up to an 8 out of 10 severity which it is at now.  She has had some photophobia but denies any trauma.  She is not reporting any focal neurologic deficits such as speech difficulties, vision changes, numbness, tingling, or weakness of extremities.  She denies any fevers, chills, congestion, cough, nausea, vomiting, constipation, diarrhea, or urinary changes.  She is worried about these evolution in headaches and says that home medications are not helping.  Last night it was so bad she could not sleep and presents for evaluation.  On exam, lungs clear.  Chest nontender.  Abdomen nontender.  Back nontender.  Patient does have tenderness to her right neck going in towards her occiput.  Pupils are symmetric and reactive and she did have some bilateral nystagmus symmetrically.  She reports this is not new.  Symmetric smile.  Clear speech.  Normal finger-nose-finger testing.  Intact sensation and strength and pulses in extremities.  Patient otherwise well-appearing and resting.  Given the patient's report that this all started after a dental procedure, I am unsure if she may have irritated something musculoskeletal wise in her neck from positioning for the procedure and this Evolution of headaches.  That being said, she does have carotid and cervical disease and so we agreed together to get CT of the head neck to look for dissection or other causes of head and neck pain.  Will also get a CT venogram as it is a prolonged multiple week headache that is different than headache she has had in the past.  She does not history of migraines and given the photosensitivity this also could be headaches triggered.  I spoke with patient and family and we agreed to get some screening labs to look for significant lecture light disturbance that may contribute some of the tremors, CTA head and neck and CT venogram, give  her headache cocktail.  If she is feeling better, and workup reassuring, anticipate discharge home or possible even outpatient neurology follow-up for these headaches.  8:48 PM CT of the head returned showing evidence of possible small right ICA aneurysm and age-indeterminate vertebral occlusion since 2020.  I meant to order CT head and neck but only ordered the CTA head so I spoke to neurology and they do recommending the CTA neck as I initially intended to order.  Patient and family are amenable to getting the CTA neck and we will give her some more pain medicine as the headache cocktail only slightly helped the headache but did not help the neck pain at all.  Anticipate reassessment of the CTA to determine disposition.  CTA did not show other large dissection or other acute problem.  I called and spoke with neurology who reviewed the images and they feel she is safe for outpatient neurology follow-up with Dr. Narciso Backers whom she is established with.  Patient may need outpatient occipital nerve management again.  Patient agrees with this plan.  Due to the spasm and pain she was having, we agreed to give her prescription for some Lidoderm patches and some low-dose muscle relaxant and she has oxycodone at home she reports that she has tolerated.  She understood return precautions and follow-up instructions and will be discharged outpatient follow-up.         Final Clinical Impression(s) / ED Diagnoses Final diagnoses:  Neck pain  Nonintractable headache, unspecified chronicity pattern, unspecified headache type  Muscle spasm    Rx / DC Orders ED Discharge Orders          Ordered    cyclobenzaprine (FLEXERIL) 5 MG tablet  2 times daily PRN        04/24/24 2310    lidocaine (LIDODERM) 5 %  Every 24 hours        04/24/24 2310            Clinical Impression: 1. Neck pain   2. Nonintractable headache, unspecified chronicity pattern, unspecified headache type   3. Muscle spasm      Disposition: Discharge  Condition: Good  I have discussed the results, Dx and Tx plan with the pt(& family if present). He/she/they expressed understanding and agree(s) with the plan. Discharge instructions discussed at great length. Strict return precautions discussed and pt &/or family have verbalized understanding of the instructions. No further questions at time of discharge.    New Prescriptions   CYCLOBENZAPRINE (FLEXERIL) 5  MG TABLET    Take 1 tablet (5 mg total) by mouth 2 (two) times daily as needed for muscle spasms.   LIDOCAINE (LIDODERM) 5 %    Place 1 patch onto the skin daily. Remove & Discard patch within 12 hours or as directed by MD    Follow Up: Glory Larsen, MD [address] [place] [address] [phone]     Candise Chambers, MD [address] [place] [address] [phone]     [facility] Emergency Department at [facility] [address] [place]  [id number] [phone]       Amaree Leeper, Marine Sia, MD 04/24/24 [phone]

## 2024-04-27 ENCOUNTER — Other Ambulatory Visit: Payer: Self-pay | Admitting: Cardiology

## 2024-04-30 ENCOUNTER — Telehealth: Payer: Self-pay | Admitting: Neurology

## 2024-04-30 NOTE — Telephone Encounter (Signed)
 Patient daughter Roberta Chin called stating that her mother was seen in the ER and they informed her that she needs to have an appointment with Dr. Tresia Fruit ASAP for a lidocaine  shot.

## 2024-04-30 NOTE — Telephone Encounter (Signed)
 Pt had already sent an appt request message and it was forwarded to Dr Tresia Fruit for review.

## 2024-04-30 NOTE — Telephone Encounter (Signed)
 Dr Tresia Fruit responded back in the appt request. This is being relayed to patient.   From Dr Tresia Fruit:  Cristino Donna may be referring to occipital nerve blocks or epidural injections I believe which I do not perform, she has seen dr newton in the past. She should see her primary care Dr. Vela Gerhard for her neck pain and possibly Dr. Daisey Dryer for injections in the neck nothing further from neurology for this patient can we put a note in the chart to check with dr ahern prior to any appointment in case dr blomgren sends in a new referral(then I can contact him directly). Please let patient know thank you.

## 2024-05-01 ENCOUNTER — Telehealth: Payer: Self-pay | Admitting: Physical Medicine and Rehabilitation

## 2024-05-01 NOTE — Telephone Encounter (Signed)
 Pt daughter called to request an appointment for a shot

## 2024-05-01 NOTE — Telephone Encounter (Signed)
 Pt's daughter called, message from 5-5 at 11:08 was read to her.

## 2024-05-02 NOTE — Telephone Encounter (Signed)
 I had sent this message to the patient in response to her mychart request for an appointment:  Hey Ms Qualley, Dr Tresia Fruit reviewed. She is no longer performing occipital nerve blocks. She is recommending that you see Dr. Vela Gerhard for your neck pain and possibly Dr Daisey Dryer for injections in the neck as you have seen him in the past. I am so sorry you're having this pain. Looks like from Dr Harding Li neurology perspective, she is saying we do not have anything further we can offer at this time.    Sincerely, Lawana Hartzell  ---------  The message was never read so I called her daughter Burdette Carolin (on [place]) and LVM (ok per DPR) informing her of the message. I called her before I saw in this phone note that daughter had already called back yesterday and was informed of recommendation to see pcp and Dr Daisey Dryer. No further action required.

## 2024-05-07 ENCOUNTER — Other Ambulatory Visit: Payer: Self-pay | Admitting: Cardiology

## 2024-05-07 ENCOUNTER — Ambulatory Visit: Admitting: Physical Medicine and Rehabilitation

## 2024-05-09 ENCOUNTER — Encounter: Payer: Self-pay | Admitting: Physical Medicine and Rehabilitation

## 2024-05-09 ENCOUNTER — Ambulatory Visit (INDEPENDENT_AMBULATORY_CARE_PROVIDER_SITE_OTHER): Admitting: Physical Medicine and Rehabilitation

## 2024-05-09 DIAGNOSIS — Z981 Arthrodesis status: Secondary | ICD-10-CM | POA: Diagnosis not present

## 2024-05-09 DIAGNOSIS — M47812 Spondylosis without myelopathy or radiculopathy, cervical region: Secondary | ICD-10-CM

## 2024-05-09 DIAGNOSIS — M542 Cervicalgia: Secondary | ICD-10-CM | POA: Diagnosis not present

## 2024-05-09 NOTE — Progress Notes (Signed)
 Pain Scale   Average Pain 10 Patient advised she has been having neck x 6 months , patient advising that the pain doesn't radiate.         +Driver, -BT, -Dye Allergies.

## 2024-05-09 NOTE — Progress Notes (Signed)
 [name] - 88 y.o. female MRN [id number]  Date of birth: 25-Dec-1936  Office Visit Note: Visit Date: 05/09/2024 PCP: Candise Chambers, MD Referred by: Candise Chambers, MD  Subjective: Chief Complaint  Patient presents with   Neck - Pain   HPI: [name] is a 88 y.o. female who comes in today for evaluation of chronic, worsening and severe right sided neck pain. Patient is well known to us , she is here with her daughter Dulcy Gibney today. Pain ongoing for several months, started after having tooth extracted in November of 2024. Her pain worsens with side to side rotation and movement. She describes pain as pulling and sore sensation, currently rates as 10 out of 10. Some relief of pain with home exercise regimen, rest and use of medications. Some relief of pain with topical Voltaren  gel. She was evaluated in the emergency room on 04/24/2024 for continued neck pain and headache. CT angio of neck from ED visit was negative for large dissection/acute issues. Cervical MRI imaging from 2021 shows advanced facet arthrosis on the right at C2-C3. Moderate central canal stenosis at C3-C4. She does have history of multiple cervical surgeries with Dr. Elna Haggis. She is fused from C3-C4 down to C7-T1. More recently, Dr. Daisey Dryer performed fluoro guided right glenohumeral injection on 02/22/2024 that provided significant relief of her shoulder pain. Patient denies focal weakness, numbness and tingling. No recent trauma or falls. She is using walker to assist with ambulation.      Review of Systems  Musculoskeletal:  Positive for myalgias and neck pain.  Neurological:  Negative for tingling, sensory change, focal weakness and weakness.  All other systems reviewed and are negative.  Otherwise per HPI.  Assessment & Plan: Visit Diagnoses:    ICD-10-CM   1. Cervicalgia  M54.2 Ambulatory referral to Physical Medicine Rehab    2. Facet arthropathy, cervical  M47.812 Ambulatory referral to  Physical Medicine Rehab    3. S/P cervical spinal fusion  Z98.1 Ambulatory referral to Physical Medicine Rehab       Plan: Findings:  Chronic, worsening and severe right sided axial neck pain. No radicular symptoms down the arms. Patient continues to have severe pain despite good conservative therapies such as home exercise regimen, rest and use of medications. Patients clinical presentation and exam are consistent with facet mediated pain. She does have severe pain with side to side rotation. There is facet arthrosis at the level of C2-C3, advanced on the right. There is moderate cervical canal stenosis at C3-C4. We discussed treatment plan in detail today. Next step is to perform diagnostic and hopefully therapeutic right C2-C2 facet joint injections under fluoroscopic guidance. If good relief of pain we can repeat this procedure infrequently as needed. Would also consider longer sustained pain relief with radiofrequency ablation. I discussed injection procedure with patient and daughter today, no questions at this time. Should her symptoms persist would consider obtaining new cervical MRI imaging and re-grouping with physical therapy. We will see her back for injection pending insurance approval. No red flag symptoms noted upon exam today.     Meds & Orders: No orders of the defined types were placed in this encounter.   Orders Placed This Encounter  Procedures   Ambulatory referral to Physical Medicine Rehab    Follow-up: Return for Right C2-C3 facet joint injections .   Procedures: No procedures performed      Clinical History: RIGHT SHOULDER - 2+ VIEW   COMPARISON:  X-ray 07/23/2014.  FINDINGS: Frontal, transscapular, and axillary views of the right shoulder are obtained. No acute displaced fracture. There is chronic narrowing of the acromial humeral interval consistent with longstanding rotator cuff tear. Prior resection or resorption of the distal right clavicle. Marked  acromial spurring. Moderate glenohumeral joint osteoarthritis, which has progressed in the interim. Visualized portions of the right chest are clear.   IMPRESSION: 1. Progressive right shoulder osteoarthritis. 2. Evidence of chronic longstanding rotator cuff tear.     Electronically Signed   By: Bobbye Burrow M.D.   On: 03/31/2022 11:04  ------------------------------------ MRI CERVICAL SPINE WITHOUT CONTRAST   TECHNIQUE: Multiplanar, multisequence MR imaging of the cervical spine was performed. No intravenous contrast was administered.   COMPARISON:  Prior cervical CT myelogram 01/15/2016   FINDINGS: Alignment: Mild fused grade 1 retrolisthesis at C3-C4. Mild fused grade 1 retrolisthesis at C5-C6. Mild fused grade 1 anterolisthesis at C6-C7 and C7-T1.   Vertebrae: Vertebral body height is maintained. Susceptibility artifact from ventral plates at the Z6-X0 and C5-C6 levels. Solid fusion at the C3-C5 levels and C6-T1 levels.   Cord: No spinal cord signal abnormality.   Posterior Fossa, vertebral arteries, paraspinal tissues: Cerebellar atrophy. Flow voids preserved within the imaged cervical vertebral arteries. Paraspinal soft tissues within normal limits.   Disc levels:   Unless otherwise stated, the level by level findings below have not significantly changed since prior cervical CT myelogram 01/15/2016.   As before, there is advanced disc degeneration at T1-T2 and T2-T3.   C2-C3: No significant disc herniation. Facet arthrosis (advanced on the right). No significant spinal canal stenosis. Mild right neural foraminal narrowing.   C3-C4: Prior ACDF with fusion across the disc space. Vertebral osteophytosis. Facet hypertrophy. Moderate central canal stenosis with slight flattening of the ventral cord. Bilateral neural foraminal narrowing (mild right, moderate left).   C4-C5: Fusion across the disc space. Vertebral osteophytosis. Facet hypertrophy. No significant  spinal canal stenosis. Mild right neural foraminal narrowing.   C5-C6: Prior ACDF. Redemonstrated pseudoarthrosis. Vertebral osteophytosis. Facet hypertrophy. No significant spinal canal stenosis. Bilateral neural foraminal narrowing (mild right, severe left).   C6-C7: Fusion across the disc space. Vertebral osteophytosis. Facet hypertrophy. No significant spinal canal or foraminal stenosis.   C7-T1: Fusion across the disc space. Vertebral osteophytosis. Facet hypertrophy. No significant spinal canal or foraminal stenosis.   T1-T2: Posterior disc osteophyte complex with bilateral disc osteophyte ridge. Facet hypertrophy. Mild relative spinal canal narrowing. Mild/moderate bilateral neural foraminal narrowing (greater on the right).   IMPRESSION: Postsurgical changes and spondylosis of the cervical spine have not significantly changed as compared to cervical CT myelogram performed 01/15/2016. Findings are most notably as follows.   At C3-C4, vertebral osteophytosis and facet hypertrophy contribute to moderate central canal stenosis with slight flattening of the ventral cord. Bilateral neural foraminal narrowing (mild right, moderate left).   Additional sites of neural foraminal narrowing as described and greatest on the left at C5-C6 (severe at this site).   Redemonstrated C5-C6 pseudoarthrosis.     Electronically Signed   By: Bascom Lily DO   On: 06/28/2020 17:18   She reports that she quit smoking about 45 years ago. Her smoking use included cigarettes. She started smoking about 48 years ago. She has a 3 pack-year smoking history. She has never used smokeless tobacco. No results for input(s): "HGBA1C", "LABURIC" in the last 8760 hours.  Objective:  VS:  HT:    WT:   BMI:     BP:   HR: bpm  TEMP: ( )  RESP:  Physical Exam Vitals and nursing note reviewed.  HENT:     Head: Normocephalic and atraumatic.     Right Ear: External ear normal.     Left Ear: External ear  normal.     Nose: Nose normal.     Mouth/Throat:     Mouth: Mucous membranes are moist.  Eyes:     Extraocular Movements: Extraocular movements intact.  Cardiovascular:     Rate and Rhythm: Normal rate.     Pulses: Normal pulses.  Pulmonary:     Effort: Pulmonary effort is normal.  Abdominal:     General: Abdomen is flat. There is no distension.  Musculoskeletal:        General: Tenderness present.     Cervical back: Tenderness present.     Comments: Pain noted with side to side rotation. Patient has good strength in the upper extremities including 5 out of 5 strength in wrist extension, long finger flexion and APB. Shoulder range of motion is full bilaterally without any sign of impingement. There is no atrophy of the hands intrinsically. Sensation intact bilaterally. Negative Hoffman's sign. Negative Spurling's sign.     Skin:    General: Skin is warm and dry.     Capillary Refill: Capillary refill takes less than 2 seconds.  Neurological:     Mental Status: She is alert and oriented to person, place, and time.     Gait: Gait abnormal.  Psychiatric:        Mood and Affect: Mood normal.        Behavior: Behavior normal.     Ortho Exam  Imaging: No results found.  Past Medical/Family/Surgical/Social History: Medications & Allergies reviewed per EMR, new medications updated. Patient Active Problem List   Diagnosis Date Noted   Migraine with aura and with status migrainosus, not intractable 12/16/2022   History of stroke 09/23/2020   CAD (coronary artery disease) 08/12/2016   Hyperlipidemia 01/02/2016   Essential hypertension 01/02/2016   Aortic insufficiency 01/02/2016   Dizziness 09/10/2015   Carotid artery stenosis 10/03/2012   DEEP VENOUS THROMBOPHLEBITIS, LEG, LEFT 03/02/2010   DEGENERATIVE DISC DISEASE, CERVICAL SPINE 03/02/2010   DEGENERATIVE DISC DISEASE, LUMBAR SPINE 03/02/2010   SHINGLES 02/08/2010   DISCITIS 02/08/2010   Type II diabetes mellitus with renal  manifestations (HCC) 12/2009   Hyponatremia 12/2009   Generalized weakness 12/2009   Leukocytosis 12/2009   Chronic diastolic CHF (congestive heart failure) (HCC) 12/2009   GERD (gastroesophageal reflux disease) 12/2009   CKD (chronic kidney disease), stage III 12/2009   HLD (hyperlipidemia) 12/2009   Past Medical History:  Diagnosis Date   Allergy    Carotid artery occlusion    Chronic diastolic CHF (congestive heart failure) (HCC) 12/2009   Diabetes mellitus without complication (HCC)    History of shingles 11/2008   Hyperlipidemia    Hypertension    Kidney stones 2012   Lipoma    L eyelid   Osteoarthritis of cervical spine    Stroke (HCC) 12/2009   Family History  Problem Relation Age of Onset   Dementia Mother    Heart attack Father    Past Surgical History:  Procedure Laterality Date   ABDOMINAL HYSTERECTOMY  1973   ANKLE RECONSTRUCTION  1990   right - 5 screws & plate   CHOLECYSTECTOMY  1988   FOOT FUSION  2005   left - 4 screws   GALLBLADDER SURGERY  1988   large stone blockage  HERNIA REPAIR  1963   left side   LUMBAR DISC SURGERY  1975   LUMBAR EPIDURAL INJECTION  May-Aug-Sept. 2015/ 08-29-15   ROTATOR CUFF REPAIR  1994   right   spinal fusion surgery  2000   C4 and C5   spinal fusion surgery  2003, 2010   C6 and C7   SPINE SURGERY  2000   C4-C5 fusion   SPINE SURGERY  2003   C6-C7 fusion   SPINE SURGERY  2010   replace broken plate (cervical region)   STOMACH SURGERY  2002   mass rt & left (not cancerous)   Social History   Occupational History   Not on file  Tobacco Use   Smoking status: Former    Current packs/day: 0.00    Average packs/day: 1 pack/day for 3.0 years (3.0 ttl pk-yrs)    Types: Cigarettes    Start date: 02/09/1976    Quit date: 02/08/1979    Years since quitting: 45.2   Smokeless tobacco: Never  Vaping Use   Vaping status: Never Used  Substance and Sexual Activity   Alcohol use: No   Drug use: No   Sexual activity:  Not on file

## 2024-05-23 ENCOUNTER — Ambulatory Visit (INDEPENDENT_AMBULATORY_CARE_PROVIDER_SITE_OTHER): Admitting: Physical Medicine and Rehabilitation

## 2024-05-23 ENCOUNTER — Other Ambulatory Visit: Payer: Self-pay

## 2024-05-23 VITALS — BP 177/83 | HR 101

## 2024-05-23 DIAGNOSIS — M47812 Spondylosis without myelopathy or radiculopathy, cervical region: Secondary | ICD-10-CM | POA: Diagnosis not present

## 2024-05-23 MED ORDER — METHYLPREDNISOLONE ACETATE 40 MG/ML IJ SUSP
40.0000 mg | Freq: Once | INTRAMUSCULAR | Status: AC
  Administered 2024-05-23: 40 mg

## 2024-05-23 NOTE — Progress Notes (Signed)
 Pain Scale   Average Pain 10 Patient advising her neck pain increases when moving her head, she does advise that she get some relief when resting her neck area.        +Driver, -BT, -Dye Allergies.

## 2024-05-23 NOTE — Patient Instructions (Signed)

## 2024-05-26 NOTE — Procedures (Signed)
 Diagnostic Cervical Facet Joint Nerve Block with Fluoroscopic Guidance  [name]      Date of Birth: 03-Sep-1936 MRN: [id number] PCP: Candise Chambers, MD      Visit Date: 05/23/2024   Universal Protocol:    Date/Time: 05/31/258:53 AM  Consent Given By: the patient  Position: LATERAL  Additional Comments: Vital signs were monitored before and after the procedure. Patient was prepped and draped in the usual sterile fashion. The correct patient, procedure, and site was verified.   Injection Procedure Details:   Procedure diagnoses: Facet arthropathy, cervical [M47.812]   Meds Administered:  Meds ordered this encounter  Medications   methylPREDNISolone  acetate (DEPO-MEDROL ) injection 40 mg     Laterality: Right  Location/Site:  C2-3  Needle size: 25 G  Needle type: spinal needle  Needle Placement: Articular Pillar  Findings:  -Contrast Used: 0.5 mL iohexol  180 mg iodine/mL   -Comments: Excellent flow of contrast across the articular pillars without intravascular flow  Procedure Details: The fluoroscope beam was manipulated to achieve the best "true" lateral view possible by squaring off the endplates with cranial and caudal tilt and using varying obliquity to achieve the a view with the longest length of spinous process.  The region overlying the facet joints mentioned above were then localized under fluoroscopic visualization.  The needle was inserted down to the center of the "trapezoid" outline of the facet joint lateral mass. Bi-planar images were used for confirming placement and spot radiographs were documented.  A 0.25 ml volume of Omnipaque -240 was injected to look for vascular uptake. A 0.5 ml. volume of the anesthetic solution was injected onto the target. This procedure was repeated for each medial branch nerve injected.  Prior to the procedure, the patient was given a Pain Diary which was completed for baseline measurements.  After the  procedure, the patient rated their pain every 30 minutes and will continue rating at this frequency for a total of 5 hours.  The patient has been asked to complete the Diary and return to us  by mail, fax or hand delivered as soon as possible.   Additional Comments:  The patient tolerated the procedure well Dressing: Band-Aid    Post-procedure details: Patient was observed during the procedure. Post-procedure instructions were reviewed.  Patient left the clinic in stable condition.

## 2024-05-26 NOTE — Progress Notes (Signed)
 [name] - 88 y.o. female MRN [id number]  Date of birth: 1936-03-05  Office Visit Note: Visit Date: 05/23/2024 PCP: Candise Chambers, MD Referred by: Candise Chambers, MD  Subjective: Chief Complaint  Patient presents with   Neck - Pain   HPI:  [name] is a 88 y.o. female who comes in today at the request of Elvan Hamel, FNP for planned Right  C2-3 Cervical facet/medial branch block with fluoroscopic guidance.  The patient has failed conservative care including home exercise, medications, time and activity modification.  This injection will be diagnostic and hopefully therapeutic.  Please see requesting physician notes for further details and justification.  Exam has shown concordant pain with facet joint loading.   ROS Otherwise per HPI.  Assessment & Plan: Visit Diagnoses:    ICD-10-CM   1. Facet arthropathy, cervical  M47.812 XR C-ARM NO REPORT    Facet Injection    methylPREDNISolone  acetate (DEPO-MEDROL ) injection 40 mg      Plan: No additional findings.   Meds & Orders:  Meds ordered this encounter  Medications   methylPREDNISolone  acetate (DEPO-MEDROL ) injection 40 mg    Orders Placed This Encounter  Procedures   Facet Injection   XR C-ARM NO REPORT    Follow-up: Return if symptoms worsen or fail to improve.   Procedures: No procedures performed  Diagnostic Cervical Facet Joint Nerve Block with Fluoroscopic Guidance  [name]      Date of Birth: Aug 31, 1936 MRN: [id number] PCP: Candise Chambers, MD      Visit Date: 05/23/2024   Universal Protocol:    Date/Time: 05/31/258:53 AM  Consent Given By: the patient  Position: LATERAL  Additional Comments: Vital signs were monitored before and after the procedure. Patient was prepped and draped in the usual sterile fashion. The correct patient, procedure, and site was verified.   Injection Procedure Details:   Procedure diagnoses: Facet arthropathy, cervical [M47.812]    Meds Administered:  Meds ordered this encounter  Medications   methylPREDNISolone  acetate (DEPO-MEDROL ) injection 40 mg     Laterality: Right  Location/Site:  C2-3  Needle size: 25 G  Needle type: spinal needle  Needle Placement: Articular Pillar  Findings:  -Contrast Used: 0.5 mL iohexol  180 mg iodine/mL   -Comments: Excellent flow of contrast across the articular pillars without intravascular flow  Procedure Details: The fluoroscope beam was manipulated to achieve the best "true" lateral view possible by squaring off the endplates with cranial and caudal tilt and using varying obliquity to achieve the a view with the longest length of spinous process.  The region overlying the facet joints mentioned above were then localized under fluoroscopic visualization.  The needle was inserted down to the center of the "trapezoid" outline of the facet joint lateral mass. Bi-planar images were used for confirming placement and spot radiographs were documented.  A 0.25 ml volume of Omnipaque -240 was injected to look for vascular uptake. A 0.5 ml. volume of the anesthetic solution was injected onto the target. This procedure was repeated for each medial branch nerve injected.  Prior to the procedure, the patient was given a Pain Diary which was completed for baseline measurements.  After the procedure, the patient rated their pain every 30 minutes and will continue rating at this frequency for a total of 5 hours.  The patient has been asked to complete the Diary and return to us  by mail, fax or hand delivered as soon as possible.   Additional Comments:  The  patient tolerated the procedure well Dressing: Band-Aid    Post-procedure details: Patient was observed during the procedure. Post-procedure instructions were reviewed.  Patient left the clinic in stable condition.     Clinical History: RIGHT SHOULDER - 2+ VIEW   COMPARISON:  X-ray 07/23/2014.   FINDINGS: Frontal,  transscapular, and axillary views of the right shoulder are obtained. No acute displaced fracture. There is chronic narrowing of the acromial humeral interval consistent with longstanding rotator cuff tear. Prior resection or resorption of the distal right clavicle. Marked acromial spurring. Moderate glenohumeral joint osteoarthritis, which has progressed in the interim. Visualized portions of the right chest are clear.   IMPRESSION: 1. Progressive right shoulder osteoarthritis. 2. Evidence of chronic longstanding rotator cuff tear.     Electronically Signed   By: Bobbye Burrow M.D.   On: 03/31/2022 11:04  ------------------------------------ MRI CERVICAL SPINE WITHOUT CONTRAST   TECHNIQUE: Multiplanar, multisequence MR imaging of the cervical spine was performed. No intravenous contrast was administered.   COMPARISON:  Prior cervical CT myelogram 01/15/2016   FINDINGS: Alignment: Mild fused grade 1 retrolisthesis at C3-C4. Mild fused grade 1 retrolisthesis at C5-C6. Mild fused grade 1 anterolisthesis at C6-C7 and C7-T1.   Vertebrae: Vertebral body height is maintained. Susceptibility artifact from ventral plates at the M5-H8 and C5-C6 levels. Solid fusion at the C3-C5 levels and C6-T1 levels.   Cord: No spinal cord signal abnormality.   Posterior Fossa, vertebral arteries, paraspinal tissues: Cerebellar atrophy. Flow voids preserved within the imaged cervical vertebral arteries. Paraspinal soft tissues within normal limits.   Disc levels:   Unless otherwise stated, the level by level findings below have not significantly changed since prior cervical CT myelogram 01/15/2016.   As before, there is advanced disc degeneration at T1-T2 and T2-T3.   C2-C3: No significant disc herniation. Facet arthrosis (advanced on the right). No significant spinal canal stenosis. Mild right neural foraminal narrowing.   C3-C4: Prior ACDF with fusion across the disc space.  Vertebral osteophytosis. Facet hypertrophy. Moderate central canal stenosis with slight flattening of the ventral cord. Bilateral neural foraminal narrowing (mild right, moderate left).   C4-C5: Fusion across the disc space. Vertebral osteophytosis. Facet hypertrophy. No significant spinal canal stenosis. Mild right neural foraminal narrowing.   C5-C6: Prior ACDF. Redemonstrated pseudoarthrosis. Vertebral osteophytosis. Facet hypertrophy. No significant spinal canal stenosis. Bilateral neural foraminal narrowing (mild right, severe left).   C6-C7: Fusion across the disc space. Vertebral osteophytosis. Facet hypertrophy. No significant spinal canal or foraminal stenosis.   C7-T1: Fusion across the disc space. Vertebral osteophytosis. Facet hypertrophy. No significant spinal canal or foraminal stenosis.   T1-T2: Posterior disc osteophyte complex with bilateral disc osteophyte ridge. Facet hypertrophy. Mild relative spinal canal narrowing. Mild/moderate bilateral neural foraminal narrowing (greater on the right).   IMPRESSION: Postsurgical changes and spondylosis of the cervical spine have not significantly changed as compared to cervical CT myelogram performed 01/15/2016. Findings are most notably as follows.   At C3-C4, vertebral osteophytosis and facet hypertrophy contribute to moderate central canal stenosis with slight flattening of the ventral cord. Bilateral neural foraminal narrowing (mild right, moderate left).   Additional sites of neural foraminal narrowing as described and greatest on the left at C5-C6 (severe at this site).   Redemonstrated C5-C6 pseudoarthrosis.     Electronically Signed   By: Bascom Lily DO   On: 06/28/2020 17:18     Objective:  VS:  HT:    WT:   BMI:     BP:(!) 177/83  HR:(!) 101bpm  TEMP: ( )  RESP:  Physical Exam Vitals and nursing note reviewed.  Constitutional:      General: She is not in acute distress.    Appearance:  Normal appearance. She is not ill-appearing.  HENT:     Head: Normocephalic and atraumatic.     Right Ear: External ear normal.     Left Ear: External ear normal.  Eyes:     Extraocular Movements: Extraocular movements intact.  Cardiovascular:     Rate and Rhythm: Normal rate.     Pulses: Normal pulses.  Musculoskeletal:     Cervical back: Tenderness present. No rigidity.     Right lower leg: No edema.     Left lower leg: No edema.     Comments: Patient has good strength in the upper extremities including 5 out of 5 strength in wrist extension long finger flexion and APB.  There is no atrophy of the hands intrinsically.  There is a negative Hoffmann's test.   Lymphadenopathy:     Cervical: No cervical adenopathy.  Skin:    Findings: No erythema, lesion or rash.  Neurological:     General: No focal deficit present.     Mental Status: She is alert and oriented to person, place, and time.     Sensory: No sensory deficit.     Motor: No weakness or abnormal muscle tone.     Coordination: Coordination normal.  Psychiatric:        Mood and Affect: Mood normal.        Behavior: Behavior normal.      Imaging: No results found.

## 2024-05-31 ENCOUNTER — Encounter: Payer: Self-pay | Admitting: Cardiology

## 2024-06-23 LAB — LAB REPORT - SCANNED
A1c: 5.7
EGFR: 44

## 2024-07-11 ENCOUNTER — Encounter: Payer: Self-pay | Admitting: Family Medicine

## 2024-07-18 ENCOUNTER — Encounter (HOSPITAL_COMMUNITY)

## 2024-08-13 ENCOUNTER — Other Ambulatory Visit: Payer: Self-pay | Admitting: Family Medicine

## 2024-08-13 DIAGNOSIS — M4802 Spinal stenosis, cervical region: Secondary | ICD-10-CM

## 2024-08-28 ENCOUNTER — Ambulatory Visit
Admission: RE | Admit: 2024-08-28 | Discharge: 2024-08-28 | Disposition: A | Discharge status: Home / Self Care | Source: Ambulatory Visit | Attending: Family Medicine | Admitting: Family Medicine

## 2024-08-28 DIAGNOSIS — M4802 Spinal stenosis, cervical region: Secondary | ICD-10-CM

## 2024-09-15 ENCOUNTER — Emergency Department (HOSPITAL_BASED_OUTPATIENT_CLINIC_OR_DEPARTMENT_OTHER)

## 2024-09-15 ENCOUNTER — Encounter (HOSPITAL_BASED_OUTPATIENT_CLINIC_OR_DEPARTMENT_OTHER): Payer: Self-pay

## 2024-09-15 ENCOUNTER — Emergency Department (HOSPITAL_BASED_OUTPATIENT_CLINIC_OR_DEPARTMENT_OTHER)
Admission: EM | Admit: 2024-09-15 | Discharge: 2024-09-16 | Disposition: A | Discharge status: Home / Self Care | Attending: Emergency Medicine | Admitting: Emergency Medicine

## 2024-09-15 ENCOUNTER — Other Ambulatory Visit: Payer: Self-pay

## 2024-09-15 DIAGNOSIS — W19XXXA Unspecified fall, initial encounter: Secondary | ICD-10-CM | POA: Diagnosis not present

## 2024-09-15 DIAGNOSIS — Z79899 Other long term (current) drug therapy: Secondary | ICD-10-CM | POA: Diagnosis not present

## 2024-09-15 DIAGNOSIS — R404 Transient alteration of awareness: Secondary | ICD-10-CM | POA: Diagnosis not present

## 2024-09-15 DIAGNOSIS — Y9301 Activity, walking, marching and hiking: Secondary | ICD-10-CM | POA: Insufficient documentation

## 2024-09-15 DIAGNOSIS — R911 Solitary pulmonary nodule: Secondary | ICD-10-CM | POA: Diagnosis not present

## 2024-09-15 DIAGNOSIS — Z7982 Long term (current) use of aspirin: Secondary | ICD-10-CM | POA: Insufficient documentation

## 2024-09-15 DIAGNOSIS — S0003XA Contusion of scalp, initial encounter: Secondary | ICD-10-CM | POA: Insufficient documentation

## 2024-09-15 DIAGNOSIS — S60221A Contusion of right hand, initial encounter: Secondary | ICD-10-CM | POA: Insufficient documentation

## 2024-09-15 DIAGNOSIS — S0990XA Unspecified injury of head, initial encounter: Secondary | ICD-10-CM | POA: Diagnosis present

## 2024-09-15 NOTE — ED Triage Notes (Signed)
 Pt states that she was walking at approx 2000 tonight when her walker got caught on a table causing her to fall. Pt states that she hit the back of her head and lower back on floor when she fell and this is where her pain is. She denies LOC. She denies taking blood thinner.

## 2024-09-15 NOTE — ED Provider Notes (Signed)
 [place] EMERGENCY DEPARTMENT AT [facility] Provider Note   CSN: [id number] Arrival date & time: 09/15/24  2240     Patient presents with: [name] is a 88 y.o. female.  {Add pertinent medical, surgical, social history, OB history to HPI:32947}  Fall     88 year old female presenting to the emergency department after a ground-level fall.  The patient states that her walker got caught when she was walking and caused her to fall striking the back of her head.  She sustained a hematoma to her head.  She denies loss of consciousness, is not on anticoagulation.  She had weakness and had trouble getting up off the ground after the fact and crawled to her front door.  She was subsequently able to get help and has been ambulatory since the fall.  According to her daughter, she has been more confused compared to her baseline over the past few days.  She arrives GCS 15, ABC intact.  Prior to Admission medications   Medication Sig Start Date End Date Taking? Authorizing Provider  acetaminophen  (TYLENOL ) 500 MG tablet Take 500-1,000 mg by mouth every 6 (six) hours as needed for mild pain.    [provider]  aspirin  81 MG tablet Take 81 mg by mouth daily. Per patient, takes one 81 mg daily    [provider]  CALCIUM  CITRATE PO Take 1 tablet by mouth daily.    [provider]  carboxymethylcellulose (REFRESH PLUS) 0.5 % SOLN Place 1 drop into both eyes 3 (three) times daily as needed (dry eyes).     [provider]  cyclobenzaprine  (FLEXERIL ) 5 MG tablet Take 1 tablet (5 mg total) by mouth 2 (two) times daily as needed for muscle spasms. 04/24/24   Tegeler, Lonni PARAS, MD  Evolocumab  140 MG/ML SOAJ Inject 140 mg into the skin every 14 (fourteen) days. 04/30/24   Pietro Redell RAMAN, MD  ezetimibe  (ZETIA ) 10 MG tablet Take 10 mg by mouth daily. 05/03/23   [provider]  lidocaine  (LIDODERM ) 5 % Place 1 patch onto the skin daily.  Remove & Discard patch within 12 hours or as directed by MD 04/24/24   Tegeler, Lonni PARAS, MD  LORazepam  (ATIVAN ) 1 MG tablet Take 1 tablet (1 mg total) by mouth at bedtime. 09/13/19   Fairy Frames, MD  losartan  (COZAAR ) 100 MG tablet Take 100 mg by mouth daily.    [provider]  meloxicam (MOBIC) 7.5 MG tablet Take 7.5 mg by mouth daily.    [provider]  Multiple Vitamin (MULTIVITAMIN) tablet Take 1 tablet by mouth daily.    [provider]  Omega-3 Fatty Acids (FISH OIL) 1000 MG CPDR Take 1,000 mg by mouth in the morning and at bedtime.    [provider]  omeprazole (PRILOSEC) 20 MG capsule Take 20 mg by mouth daily. 09/08/12   [provider]  polyethylene glycol (MIRALAX  / GLYCOLAX ) packet Take 17 g by mouth daily as needed for mild constipation.     [provider]  valACYclovir (VALTREX) 1000 MG tablet Take 1,000 mg by mouth 2 (two) times daily. 05/03/23   [provider]  venlafaxine XR (EFFEXOR-XR) 75 MG 24 hr capsule Take 75 mg by mouth daily. 04/15/20   [provider]  vitamin B-12 (CYANOCOBALAMIN ) 1000 MCG tablet Take 1,000 mcg by mouth daily.    [provider]  VOLTAREN  1 % GEL Apply 2 g topically daily as needed (joint  pain). For pain 09/04/12   [provider]    Allergies: Plavix [clopidogrel bisulfate], Prinivil [lisinopril], Sulfa antibiotics, Sulfasalazine, Codeine, Metronidazole , and Clopidogrel    Review of Systems  All other systems reviewed and are negative.   Updated Vital Signs BP (!) 190/63   Pulse 68   Temp 97.8 F (36.6 C) (Oral)   Resp 16   SpO2 100%   Physical Exam Vitals and nursing note reviewed.  Constitutional:      General: She is not in acute distress.    Appearance: She is well-developed.     Comments: GCS 15, ABC intact  HENT:     Head: Normocephalic.     Comments: Hematoma to the posterior occiput, minimally tender, not boggy Eyes:      Extraocular Movements: Extraocular movements intact.     Conjunctiva/sclera: Conjunctivae normal.     Pupils: Pupils are equal, round, and reactive to light.  Neck:     Comments: No midline tenderness to palpation of the cervical spine.  Range of motion intact Cardiovascular:     Rate and Rhythm: Normal rate and regular rhythm.     Heart sounds: No murmur heard. Pulmonary:     Effort: Pulmonary effort is normal. No respiratory distress.     Breath sounds: Normal breath sounds.  Chest:     Comments: Clavicles stable nontender to AP compression.  Chest wall stable and nontender to AP and lateral compression. Abdominal:     Palpations: Abdomen is soft.     Tenderness: There is no abdominal tenderness.     Comments: Pelvis stable to lateral compression  Musculoskeletal:     Cervical back: Neck supple.     Comments: No midline tenderness to palpation of the thoracic or lumbar spine.  Extremities atraumatic with intact range of motion  Skin:    General: Skin is warm and dry.  Neurological:     Mental Status: She is alert.     Comments: Cranial nerves II through XII grossly intact.  Moving all 4 extremities spontaneously.  Sensation grossly intact all 4 extremities     (all labs ordered are listed, but only abnormal results are displayed) Labs Reviewed  CBC WITH DIFFERENTIAL/PLATELET  URINALYSIS, W/ REFLEX TO CULTURE (INFECTION SUSPECTED)  CBG MONITORING, ED  TROPONIN T, HIGH SENSITIVITY    EKG: EKG Interpretation Date/Time:  Saturday September 15 2024 23:35:13 EDT Ventricular Rate:  65 PR Interval:  182 QRS Duration:  93 QT Interval:  409 QTC Calculation: 426 R Axis:   15  Text Interpretation: Sinus rhythm Confirmed by Jerrol Agent ([id number]) on 09/15/2024 11:54:30 PM  Radiology: No results found.  {Document cardiac monitor, telemetry assessment procedure when appropriate:32947} Procedures   Medications Ordered in the ED - No data to display    {Click here for ABCD2,  HEART and other calculators REFRESH Note before signing:1}                              Medical Decision Making Amount and/or Complexity of Data Reviewed Labs: ordered. Radiology: ordered.   88 year old female presenting to the emergency department after a ground-level fall.  The patient states that her walker got caught when she was walking and caused her to fall striking the back of her head.  She sustained a hematoma to her head.  She denies loss of consciousness, is not on anticoagulation.  She had weakness and had trouble getting up off the ground  after the fact and crawled to her front door.  She was subsequently able to get help and has been ambulatory since the fall.  According to her daughter, she has been more confused compared to her baseline over the past few days.  She arrives GCS 15, ABC intact.  On arrival, the patient was afebrile, not tachycardic or tachypneic, BP 190/63, saturating 100% on room air.  Patient presenting with weakness after a fall, generalized confusion.  Neurologically intact without focal deficit.  Some confusion over the past few days per daughter.  Will obtain EKG, chest x-ray, laboratory evaluation in addition to trauma imaging.  EKG: Sinus rhythm, ventricular rate 65, no acute ischemic changes or abnormal intervals.  CXR: *** Pelvis XR: *** CT Head and Cervical Spine: ***  Labs: ***  {Document critical care time when appropriate  Document review of labs and clinical decision tools ie CHADS2VASC2, etc  Document your independent review of radiology images and any outside records  Document your discussion with family members, caretakers and with consultants  Document social determinants of health affecting pt's care  Document your decision making why or why not admission, treatments were needed:32947:::1}   Final diagnoses:  None    ED Discharge Orders     None

## 2024-09-16 ENCOUNTER — Emergency Department (HOSPITAL_BASED_OUTPATIENT_CLINIC_OR_DEPARTMENT_OTHER)

## 2024-09-16 DIAGNOSIS — S0003XA Contusion of scalp, initial encounter: Secondary | ICD-10-CM | POA: Diagnosis not present

## 2024-09-16 LAB — URINALYSIS, W/ REFLEX TO CULTURE (INFECTION SUSPECTED)
Bacteria, UA: NONE SEEN
Bilirubin Urine: NEGATIVE
Glucose, UA: 100 mg/dL — AB
Hgb urine dipstick: NEGATIVE
Ketones, ur: NEGATIVE mg/dL
Leukocytes,Ua: NEGATIVE
Nitrite: NEGATIVE
Protein, ur: NEGATIVE mg/dL
Specific Gravity, Urine: <1.005 — ABNORMAL LOW (ref 1.005–1.030)
pH: 6.5 (ref 5.0–8.0)

## 2024-09-16 LAB — I-STAT VENOUS BLOOD GAS, ED
Acid-Base Excess: 4 mmol/L — ABNORMAL HIGH (ref 0.0–2.0)
Bicarbonate: 29.1 mmol/L — ABNORMAL HIGH (ref 20.0–28.0)
Calcium, Ion: 1.22 mmol/L (ref 1.15–1.40)
HCT: 38 % (ref 36.0–46.0)
Hemoglobin: 12.9 g/dL (ref 12.0–15.0)
O2 Saturation: 59 %
Patient temperature: 97.8
Potassium: 4.7 mmol/L (ref 3.5–5.1)
Sodium: 135 mmol/L (ref 135–145)
TCO2: 31 mmol/L (ref 22–32)
pCO2, Ven: 45.5 mmHg (ref 44–60)
pH, Ven: 7.413 (ref 7.25–7.43)
pO2, Ven: 30 mmHg — CL (ref 32–45)

## 2024-09-16 LAB — CBG MONITORING, ED: Glucose-Capillary: 186 mg/dL — ABNORMAL HIGH (ref 70–99)

## 2024-09-16 LAB — CBC WITH DIFFERENTIAL/PLATELET
Abs Immature Granulocytes: 0.04 K/uL (ref 0.00–0.07)
Basophils Absolute: 0 K/uL (ref 0.0–0.1)
Basophils Relative: 0 %
Eosinophils Absolute: 0.1 K/uL (ref 0.0–0.5)
Eosinophils Relative: 1 %
HCT: 39 % (ref 36.0–46.0)
Hemoglobin: 13.2 g/dL (ref 12.0–15.0)
Immature Granulocytes: 0 %
Lymphocytes Relative: 13 %
Lymphs Abs: 1.6 K/uL (ref 0.7–4.0)
MCH: 32.5 pg (ref 26.0–34.0)
MCHC: 33.8 g/dL (ref 30.0–36.0)
MCV: 96.1 fL (ref 80.0–100.0)
Monocytes Absolute: 0.9 K/uL (ref 0.1–1.0)
Monocytes Relative: 7 %
Neutro Abs: 10.2 K/uL — ABNORMAL HIGH (ref 1.7–7.7)
Neutrophils Relative %: 79 %
Platelets: 201 K/uL (ref 150–400)
RBC: 4.06 MIL/uL (ref 3.87–5.11)
RDW: 12.3 % (ref 11.5–15.5)
WBC: 12.9 K/uL — ABNORMAL HIGH (ref 4.0–10.5)
nRBC: 0 % (ref 0.0–0.2)

## 2024-09-16 LAB — COMPREHENSIVE METABOLIC PANEL WITH GFR
ALT: 16 U/L (ref 0–44)
AST: 30 U/L (ref 15–41)
Albumin: 4.5 g/dL (ref 3.5–5.0)
Alkaline Phosphatase: 59 U/L (ref 38–126)
Anion gap: 13 (ref 5–15)
BUN: 18 mg/dL (ref 8–23)
CO2: 26 mmol/L (ref 22–32)
Calcium: 10.5 mg/dL — ABNORMAL HIGH (ref 8.9–10.3)
Chloride: 100 mmol/L (ref 98–111)
Creatinine, Ser: 1.19 mg/dL — ABNORMAL HIGH (ref 0.44–1.00)
GFR, Estimated: 44 mL/min — ABNORMAL LOW (ref >60)
Glucose, Bld: 198 mg/dL — ABNORMAL HIGH (ref 70–99)
Potassium: 4.7 mmol/L (ref 3.5–5.1)
Sodium: 138 mmol/L (ref 135–145)
Total Bilirubin: 0.3 mg/dL (ref 0.0–1.2)
Total Protein: 6.8 g/dL (ref 6.5–8.1)

## 2024-09-16 LAB — TROPONIN T, HIGH SENSITIVITY: Troponin T High Sensitivity: 16 ng/L (ref 0–19)

## 2024-09-16 MED ORDER — ACETAMINOPHEN 325 MG PO TABS
650.0000 mg | ORAL_TABLET | Freq: Once | ORAL | Status: DC
  Filled 2024-09-16: qty 2

## 2024-09-16 MED ORDER — SODIUM CHLORIDE 0.9 % IV BOLUS: 500.0000 mL | Freq: Once | INTRAVENOUS | Status: DC

## 2024-09-16 NOTE — Discharge Instructions (Addendum)
 Trauma imaging was negative for acute traumatic abnormality.  Your labs showed mildly elevated serum calcium , not significantly elevated enough that would cause persistent confusion, your neurologic exam was reassuring and you are at your baseline.  Recommend follow-up with your primary care provider for recheck of your labs. Recommend ICE to the affected areas of swelling for the next 24 hours, Tylenol  for pain control. Your CT of the chest revealed likely benign pulmonary nodules: IMPRESSION:  1. No traumatic injury to the chest.  2. No right mid lung pulmonary nodule to correspond to the radiographic  abnormality, which was likely osseous.  3. 3 mm and 2 mm right lower lobe nodules, likely benign. No follow-up is  recommended per Fleischner Society guidelines.

## 2024-09-28 ENCOUNTER — Telehealth: Payer: Self-pay | Admitting: Physical Medicine and Rehabilitation

## 2024-09-28 NOTE — Telephone Encounter (Signed)
 Pt called requesting an appt for neck injection. Last injection 5/14. Please call pt [phone].

## 2024-10-03 ENCOUNTER — Telehealth: Payer: Self-pay | Admitting: Physical Medicine and Rehabilitation

## 2024-10-03 NOTE — Telephone Encounter (Signed)
 Pt called requesting an appt for neck injection. Last injection 05/23/24. Pt phone number is [phone].

## 2024-10-05 ENCOUNTER — Other Ambulatory Visit: Payer: Self-pay | Admitting: Physical Medicine and Rehabilitation

## 2024-10-05 DIAGNOSIS — M542 Cervicalgia: Secondary | ICD-10-CM

## 2024-10-05 DIAGNOSIS — M47812 Spondylosis without myelopathy or radiculopathy, cervical region: Secondary | ICD-10-CM

## 2024-10-18 ENCOUNTER — Other Ambulatory Visit: Payer: Self-pay

## 2024-10-18 ENCOUNTER — Ambulatory Visit: Admitting: Physical Medicine and Rehabilitation

## 2024-10-18 VITALS — BP 171/91 | HR 80

## 2024-10-18 DIAGNOSIS — M47812 Spondylosis without myelopathy or radiculopathy, cervical region: Secondary | ICD-10-CM | POA: Diagnosis not present

## 2024-10-18 MED ORDER — METHYLPREDNISOLONE ACETATE 40 MG/ML IJ SUSP
40.0000 mg | Freq: Once | INTRAMUSCULAR | Status: AC
  Administered 2024-10-18: 40 mg

## 2024-10-18 NOTE — Progress Notes (Signed)
 STEPHENIE NAVEJAS - 88 y.o. female MRN [id number]  Date of birth: Jan 26, 1936  Office Visit Note: Visit Date: 10/18/2024 PCP: Windy Coy, MD Referred by: Windy Coy, MD  Subjective: Chief Complaint  Patient presents with   Neck - Pain   HPI:  [name] is a 88 y.o. female who comes in today for planned repeat Right C2-3 Cervical facet/medial branch block with fluoroscopic guidance.  The patient has failed conservative care including home exercise, medications, time and activity modification.  This injection will be diagnostic and hopefully therapeutic.  Please see requesting physician notes for further details and justification.  Exam shows concordant low back pain with facet joint loading and extension. Patient received more than 80% pain relief from prior injection.     Referring:Megan Trudy, FNP   ROS Otherwise per HPI.  Assessment & Plan: Visit Diagnoses:    ICD-10-CM   1. Cervical spondylosis without myelopathy  M47.812 XR C-ARM NO REPORT    Facet Injection    methylPREDNISolone  acetate (DEPO-MEDROL ) injection 40 mg      Plan: No additional findings.   Meds & Orders:  Meds ordered this encounter  Medications   methylPREDNISolone  acetate (DEPO-MEDROL ) injection 40 mg    Orders Placed This Encounter  Procedures   Facet Injection   XR C-ARM NO REPORT    Follow-up: Return for visit to requesting provider as needed.   Procedures: No procedures performed  Cervical Facet Joint Intra-Articular Injection with Fluoroscopic Guidance  [name]      Date of Birth: 10-17-1936 MRN: [id number] PCP: Windy Coy, MD      Visit Date: 10/18/2024   Universal Protocol:    Date/Time: 10/27/258:29 PM  Consent Given By: the patient  Position: lateral  Additional Comments: Vital signs were monitored before and after the procedure. Patient was prepped and draped in the usual sterile fashion. The correct patient, procedure, and site was  verified.   Injection Procedure Details:  Procedure Site One Meds Administered:  Meds ordered this encounter  Medications   methylPREDNISolone  acetate (DEPO-MEDROL ) injection 40 mg     Laterality: Right  Location/Site:  C2-3  Needle size: 25 G  Needle type: spinal needle  Needle Placement: Articular  Findings:  -Contrast Used: 0.5 mL iohexol  180 mg iodine/mL   -Comments: Excellent flow of contrast producing a partial arthrogram.  Procedure Details: The fluoroscope beam was manipulated to achieve the best "true" lateral view possible by squaring off the endplates with cranial and caudal tilt and using varying obliquity to achieve the a view with the longest length of spinous process.   The region overlying the facet joints mentioned above were then localized under fluoroscopic visualization. For each target described below the skin was anesthetized with 1 ml of 1% Lidocaine  without epinephrine. The needle was inserted down to the level of the lateral mass of the superior articular process of the  facet joint to be injected. Then, the needle was walked off inferiorly into the lateral aspect of the facet joint. Bi-planar images were used for confirming placement and spot radiographs were documented. Radiographs were obtained of the arthrogram. A 0.5 ml. volume of the steroid/anesthetic solution was injected into the joint. This procedure was repeated for each facet joint injected.   Additional Comments:  The patient tolerated the procedure well Dressing: Band-Aid    Post-procedure details: Patient was observed during the procedure. Post-procedure instructions were reviewed.  Patient left the clinic in stable condition.    Clinical  History: MRI CERVICAL SPINE WITHOUT CONTRAST   TECHNIQUE: Multiplanar, multisequence MR imaging of the cervical spine was performed. No intravenous contrast was administered.   COMPARISON:  Cervical spine CT 11/27/2020. Cervical spine  MRI 06/27/2020.   FINDINGS: Alignment: Stable since 2021. Chronic mildly exaggerated kyphosis at the cervicothoracic junction. Chronic straightening of thoracic kyphosis elsewhere.   Vertebrae: Chronic postoperative changes in the cervical spine C3 through C6 or C7. ACDF hardware artifact persists C3 through C6. Degenerative appearing ankylosis of C7-T1. Superimposed solid arthrodesis by CT in 2021 except at C5-C6. No marrow edema there, or elsewhere in the visible spine. Normal background bone marrow signal. And stable vertebral height since 2021.   Cord: No spinal cord signal abnormality identified despite degenerative cord mass effect, detailed below.   Posterior Fossa, vertebral arteries, paraspinal tissues: Negative visible posterior fossa, brain stem. Preserved major vascular flow voids in the bilateral neck, left vertebral artery is chronically dominant. Partially retropharyngeal course of the carotids. Negative visible neck soft tissues otherwise. Negative visible lung apices.   Disc levels:   C1-C2: Bulky ligament flavum hypertrophy has progressed. Additional chronic ligamentous hypertrophy superimposed on chronic anterior C1-odontoid degeneration with sclerosis and some spurring. Subsequent cervicomedullary junction spinal stenosis appears progressed since the 2021 MRI (series 102, image 8) with new or increased mild cord mass effect. No cervicomedullary junction or upper cord signal abnormality associated.   C2-C3: Moderate to severe chronic right side facet hypertrophy at this level. Comparatively mild disc, endplate, and left facet degeneration. Borderline to mild spinal stenosis with no convincing cord mass effect (series 105, image 10). And mild to moderate right C3 foraminal stenosis appears stable. This level appears stable overall.   C3-C4: Chronic ACDF. Solid arthrodesis by CT in 2021. Residual facet and endplate spurring.   C4-C5:  Chronic ACDF.  Solid  arthrodesis on prior CT.   C5-C6: Chronic ACDF. Evidence of pseudoarthrosis here on prior CT. And chronic ligament flavum, facet hypertrophy (series 105, image 20). Mild spinal stenosis. No convincing cord mass effect. Moderate bilateral C6 neural foraminal stenosis. This level is minimally progressed since 2021.   C6-C7:  Capacious spinal canal below C5-C6. Chronic arthrodesis or ankylosis.  No stenosis.   C7-T1: Chronic degenerative appearing ankylosis. Foraminal endplate and facet hypertrophy is chronic. Mild to moderate bilateral C8 neural foraminal stenosis appears stable.   Advanced upper thoracic spine degeneration including chronic disc osteophyte complex at T1-T2. But no upper thoracic spinal stenosis.   IMPRESSION: 1. Chronic cervical spine ACDF C3 through C6, with solid postoperative and/or degenerative ankylosis from C3 through T1 except for possible chronic pseudoarthrosis at C5-C6 (see #4).   2. Progressed C1-C2 degeneration since 2021, especially ligamentous hypertrophy resulting in progress stenosis at the cervicomedullary junction since 2021. Subsequent mild spinal cord mass effect, no cord signal abnormality.   3. But fairly stable adjacent segment disease at C2-C3 since that time, mild spinal and up to moderate right C3 foraminal stenosis.   4. Chronic C5-C6 with possible pseudoarthrosis, minimal progression of mild spinal stenosis and moderate bilateral C6 neural foraminal stenosis since 2021.   5. Chronic upper thoracic spine degeneration without upper thoracic spinal stenosis.     Electronically Signed   By: VEAR Hurst M.D.   On: 08/31/2024     Objective:  VS:  HT:    WT:   BMI:     BP:(!) 171/91  HR:80bpm  TEMP: ( )  RESP:  Physical Exam Vitals and nursing note reviewed.  Constitutional:  General: She is not in acute distress.    Appearance: Normal appearance. She is not ill-appearing.  HENT:     Head: Normocephalic and atraumatic.      Right Ear: External ear normal.     Left Ear: External ear normal.  Eyes:     Extraocular Movements: Extraocular movements intact.  Cardiovascular:     Rate and Rhythm: Normal rate.     Pulses: Normal pulses.  Musculoskeletal:     Cervical back: Tenderness present. No rigidity.     Right lower leg: No edema.     Left lower leg: No edema.     Comments: Patient has good strength in the upper extremities including 5 out of 5 strength in wrist extension long finger flexion and APB.  There is no atrophy of the hands intrinsically.  There is a negative Hoffmann's test.   Lymphadenopathy:     Cervical: No cervical adenopathy.  Skin:    Findings: No erythema, lesion or rash.  Neurological:     General: No focal deficit present.     Mental Status: She is alert and oriented to person, place, and time.     Sensory: No sensory deficit.     Motor: No weakness or abnormal muscle tone.     Coordination: Coordination normal.  Psychiatric:        Mood and Affect: Mood normal.        Behavior: Behavior normal.      Imaging: No results found.

## 2024-10-18 NOTE — Progress Notes (Signed)
 Pain Scale   Average Pain 8 Patient advising her pain is constant in her neck, however   She advises her neck is stiff.       +Driver, -BT, -Dye Allergies.

## 2024-10-22 NOTE — Procedures (Signed)
 Cervical Facet Joint Intra-Articular Injection with Fluoroscopic Guidance  [name]      Date of Birth: 1935-12-28 MRN: [id number] PCP: Windy Coy, MD      Visit Date: 10/18/2024   Universal Protocol:    Date/Time: 10/27/258:29 PM  Consent Given By: the patient  Position: lateral  Additional Comments: Vital signs were monitored before and after the procedure. Patient was prepped and draped in the usual sterile fashion. The correct patient, procedure, and site was verified.   Injection Procedure Details:  Procedure Site One Meds Administered:  Meds ordered this encounter  Medications   methylPREDNISolone  acetate (DEPO-MEDROL ) injection 40 mg     Laterality: Right  Location/Site:  C2-3  Needle size: 25 G  Needle type: spinal needle  Needle Placement: Articular  Findings:  -Contrast Used: 0.5 mL iohexol  180 mg iodine/mL   -Comments: Excellent flow of contrast producing a partial arthrogram.  Procedure Details: The fluoroscope beam was manipulated to achieve the best "true" lateral view possible by squaring off the endplates with cranial and caudal tilt and using varying obliquity to achieve the a view with the longest length of spinous process.   The region overlying the facet joints mentioned above were then localized under fluoroscopic visualization. For each target described below the skin was anesthetized with 1 ml of 1% Lidocaine  without epinephrine. The needle was inserted down to the level of the lateral mass of the superior articular process of the  facet joint to be injected. Then, the needle was walked off inferiorly into the lateral aspect of the facet joint. Bi-planar images were used for confirming placement and spot radiographs were documented. Radiographs were obtained of the arthrogram. A 0.5 ml. volume of the steroid/anesthetic solution was injected into the joint. This procedure was repeated for each facet joint  injected.   Additional Comments:  The patient tolerated the procedure well Dressing: Band-Aid    Post-procedure details: Patient was observed during the procedure. Post-procedure instructions were reviewed.  Patient left the clinic in stable condition.

## 2024-10-24 ENCOUNTER — Other Ambulatory Visit: Payer: Self-pay | Admitting: Family Medicine

## 2024-10-24 DIAGNOSIS — G44309 Post-traumatic headache, unspecified, not intractable: Secondary | ICD-10-CM

## 2024-10-29 ENCOUNTER — Encounter: Payer: Self-pay | Admitting: Radiology

## 2024-10-29 ENCOUNTER — Other Ambulatory Visit: Payer: Self-pay

## 2024-10-29 ENCOUNTER — Emergency Department (HOSPITAL_COMMUNITY)
Admission: EM | Admit: 2024-10-29 | Discharge: 2024-10-30 | Disposition: A | Attending: Emergency Medicine | Admitting: Emergency Medicine

## 2024-10-29 ENCOUNTER — Encounter (HOSPITAL_COMMUNITY): Payer: Self-pay

## 2024-10-29 DIAGNOSIS — Z79899 Other long term (current) drug therapy: Secondary | ICD-10-CM | POA: Insufficient documentation

## 2024-10-29 DIAGNOSIS — I10 Essential (primary) hypertension: Secondary | ICD-10-CM | POA: Insufficient documentation

## 2024-10-29 DIAGNOSIS — R7989 Other specified abnormal findings of blood chemistry: Secondary | ICD-10-CM | POA: Insufficient documentation

## 2024-10-29 DIAGNOSIS — R519 Headache, unspecified: Secondary | ICD-10-CM

## 2024-10-29 DIAGNOSIS — Z7982 Long term (current) use of aspirin: Secondary | ICD-10-CM | POA: Insufficient documentation

## 2024-10-29 DIAGNOSIS — R55 Syncope and collapse: Secondary | ICD-10-CM | POA: Diagnosis present

## 2024-10-29 LAB — CBC
HCT: 43 % (ref 36.0–46.0)
Hemoglobin: 14 g/dL (ref 12.0–15.0)
MCH: 31.8 pg (ref 26.0–34.0)
MCHC: 32.6 g/dL (ref 30.0–36.0)
MCV: 97.7 fL (ref 80.0–100.0)
Platelets: 283 K/uL (ref 150–400)
RBC: 4.4 MIL/uL (ref 3.87–5.11)
RDW: 12.7 % (ref 11.5–15.5)
WBC: 9.8 K/uL (ref 4.0–10.5)
nRBC: 0 % (ref 0.0–0.2)

## 2024-10-29 NOTE — ED Triage Notes (Signed)
 Pt is coming in with medic, she is coming in for severe headaches that she has been seen for recently, her pcp put her on steroids and is supposed to be getting and MRI for. She started having diaphoresis, near syncope, hypertension, vomiting with medic. Her pressure has been very high with medic as well. She does not have any neuro deficits .  Medic vitals   210/78 60hr 132bgl 97%ra 18rr  4mg  IM zofran 

## 2024-10-30 ENCOUNTER — Emergency Department (HOSPITAL_COMMUNITY)

## 2024-10-30 ENCOUNTER — Encounter: Admitting: Physical Medicine and Rehabilitation

## 2024-10-30 ENCOUNTER — Encounter (HOSPITAL_COMMUNITY): Payer: Self-pay

## 2024-10-30 DIAGNOSIS — R519 Headache, unspecified: Secondary | ICD-10-CM

## 2024-10-30 DIAGNOSIS — R55 Syncope and collapse: Secondary | ICD-10-CM | POA: Diagnosis not present

## 2024-10-30 LAB — COMPREHENSIVE METABOLIC PANEL WITH GFR
ALT: 20 U/L (ref 0–44)
AST: 30 U/L (ref 15–41)
Albumin: 4.5 g/dL (ref 3.5–5.0)
Alkaline Phosphatase: 63 U/L (ref 38–126)
Anion gap: 11 (ref 5–15)
BUN: 22 mg/dL (ref 8–23)
CO2: 29 mmol/L (ref 22–32)
Calcium: 10.4 mg/dL — ABNORMAL HIGH (ref 8.9–10.3)
Chloride: 96 mmol/L — ABNORMAL LOW (ref 98–111)
Creatinine, Ser: 1.08 mg/dL — ABNORMAL HIGH (ref 0.44–1.00)
GFR, Estimated: 50 mL/min — ABNORMAL LOW (ref >60)
Glucose, Bld: 151 mg/dL — ABNORMAL HIGH (ref 70–99)
Potassium: 5.3 mmol/L — ABNORMAL HIGH (ref 3.5–5.1)
Sodium: 136 mmol/L (ref 135–145)
Total Bilirubin: 0.4 mg/dL (ref 0.0–1.2)
Total Protein: 7.2 g/dL (ref 6.5–8.1)

## 2024-10-30 LAB — CBG MONITORING, ED: Glucose-Capillary: 105 mg/dL — ABNORMAL HIGH (ref 70–99)

## 2024-10-30 LAB — URINALYSIS, ROUTINE W REFLEX MICROSCOPIC
Bilirubin Urine: NEGATIVE
Glucose, UA: NEGATIVE mg/dL
Hgb urine dipstick: NEGATIVE
Ketones, ur: NEGATIVE mg/dL
Leukocytes,Ua: NEGATIVE
Nitrite: NEGATIVE
Protein, ur: NEGATIVE mg/dL
Specific Gravity, Urine: 1.012 (ref 1.005–1.030)
pH: 7 (ref 5.0–8.0)

## 2024-10-30 LAB — TROPONIN T, HIGH SENSITIVITY
Troponin T High Sensitivity: 24 ng/L — ABNORMAL HIGH (ref 0–19)
Troponin T High Sensitivity: 21 ng/L — ABNORMAL HIGH (ref 0–19)

## 2024-10-30 MED ORDER — TIZANIDINE HCL 4 MG PO TABS
2.0000 mg | ORAL_TABLET | Freq: Once | ORAL | Status: AC
  Administered 2024-10-30: 2 mg via ORAL
  Filled 2024-10-30: qty 1

## 2024-10-30 MED ORDER — HYDRALAZINE HCL 20 MG/ML IJ SOLN
5.0000 mg | Freq: Once | INTRAMUSCULAR | Status: AC
  Administered 2024-10-30: 5 mg via INTRAVENOUS
  Filled 2024-10-30: qty 1

## 2024-10-30 MED ORDER — METOCLOPRAMIDE HCL 5 MG/ML IJ SOLN
10.0000 mg | Freq: Once | INTRAMUSCULAR | Status: AC
  Administered 2024-10-30: 10 mg via INTRAVENOUS
  Filled 2024-10-30: qty 2

## 2024-10-30 MED ORDER — SODIUM CHLORIDE 0.9 % IV BOLUS
500.0000 mL | Freq: Once | INTRAVENOUS | Status: AC
  Administered 2024-10-30: 500 mL via INTRAVENOUS

## 2024-10-30 MED ORDER — IOHEXOL 350 MG/ML SOLN
75.0000 mL | Freq: Once | INTRAVENOUS | Status: AC | PRN
  Administered 2024-10-30: 75 mL via INTRAVENOUS

## 2024-10-30 MED ORDER — TIZANIDINE HCL 2 MG PO TABS: 2.0000 mg | ORAL_TABLET | Freq: Three times a day (TID) | ORAL | 0 refills | Status: AC | PRN

## 2024-10-30 MED ORDER — PROCHLORPERAZINE EDISYLATE 10 MG/2ML IJ SOLN
5.0000 mg | Freq: Once | INTRAMUSCULAR | Status: AC
  Administered 2024-10-30: 5 mg via INTRAVENOUS
  Filled 2024-10-30: qty 2

## 2024-10-30 MED ORDER — MAGNESIUM SULFATE IN D5W 1-5 GM/100ML-% IV SOLN
1.0000 g | Freq: Once | INTRAVENOUS | Status: AC
  Administered 2024-10-30: 1 g via INTRAVENOUS
  Filled 2024-10-30: qty 100

## 2024-10-30 NOTE — ED Notes (Signed)
 Pt ambulates well, states that she is still dizzy but a lot less than before. Orthostatic BP taken and stable.

## 2024-10-30 NOTE — Discharge Instructions (Addendum)
 The Zanaflex is a muscle relaxant medication that you were given in the emergency room.  Do not combine find that with any other muscle relaxant.  (You may have an old prescription for cyclobenzaprine  /flexeril , records show you were prescribed it in April of this year)

## 2024-10-30 NOTE — ED Provider Notes (Signed)
 [place] EMERGENCY DEPARTMENT AT [facility] Provider Note   CSN: [id number] Arrival date & time: 10/29/24  2258     Patient presents with: Hypertension   [name] is a 88 y.o. female.   The history is provided by the patient, a relative and medical records.  Hypertension  [name] is a 88 y.o. female who presents to the Emergency Department complaining of headache.  She presents to the emergency department for evaluation of acute on chronic headache.  She has been experiencing a headache since she had a fall on September 20.  Initially her headaches were mild and on the right side.  Over the last few days her headache has worsened, particularly over the last 2 days.  Headache is worse with standing.  She feels like she needs to put pressure on the right side of her head to help it feel better.  This evening she was watching TV and developed diaphoresis and vomiting and then felt cold afterwards.  She has associated photophobia.  No fever, vision changes, numbness, cough, difficulty breathing, dysuria, incontinence.  She has experienced some left-sided chest discomfort since the fall.  No abdominal pain.  She is on day 5 of 10 of a steroid treatment.  She receives injections in her neck for her neck pain.  She also has a history of hypertension, osteoarthritis.  She has been compliant with her medications.   Prior to Admission medications   Medication Sig Start Date End Date Taking? Authorizing Provider  acetaminophen  (TYLENOL ) 500 MG tablet Take 500-1,000 mg by mouth every 6 (six) hours as needed for mild pain.    [provider]  aspirin  81 MG tablet Take 81 mg by mouth daily. Per patient, takes one 81 mg daily    [provider]  CALCIUM  CITRATE PO Take 1 tablet by mouth daily.    [provider]  carboxymethylcellulose (REFRESH PLUS) 0.5 % SOLN Place 1 drop into both eyes 3 (three) times daily as needed (dry eyes).     [provider]  cyclobenzaprine  (FLEXERIL ) 5 MG tablet Take 1 tablet (5 mg total) by mouth 2 (two) times daily as needed for muscle spasms. 04/24/24   Tegeler, Lonni PARAS, MD  Evolocumab  140 MG/ML SOAJ Inject 140 mg into the skin every 14 (fourteen) days. 04/30/24   Pietro Redell RAMAN, MD  ezetimibe  (ZETIA ) 10 MG tablet Take 10 mg by mouth daily. 05/03/23   [provider]  lidocaine  (LIDODERM ) 5 % Place 1 patch onto the skin daily. Remove & Discard patch within 12 hours or as directed by MD 04/24/24   Tegeler, Lonni PARAS, MD  LORazepam  (ATIVAN ) 1 MG tablet Take 1 tablet (1 mg total) by mouth at bedtime. 09/13/19   Fairy Frames, MD  losartan  (COZAAR ) 100 MG tablet Take 100 mg by mouth daily.    [provider]  meloxicam (MOBIC) 7.5 MG tablet Take 7.5 mg by mouth daily.    [provider]  Multiple Vitamin (MULTIVITAMIN) tablet Take 1 tablet by mouth daily.    [provider]  Omega-3 Fatty Acids (FISH OIL) 1000 MG CPDR Take 1,000 mg by mouth in the morning and at bedtime.    [provider]  omeprazole (PRILOSEC) 20 MG capsule Take 20 mg by mouth daily. 09/08/12   [provider]  polyethylene glycol (MIRALAX  / GLYCOLAX ) packet Take 17 g by mouth daily as needed for mild constipation.     [provider]  valACYclovir (VALTREX) 1000 MG tablet Take 1,000 mg by mouth 2 (two) times daily. 05/03/23   [provider]  venlafaxine XR (EFFEXOR-XR) 75 MG 24 hr capsule Take 75 mg by mouth daily. 04/15/20   [provider]  vitamin B-12 (CYANOCOBALAMIN ) 1000 MCG tablet Take 1,000 mcg by mouth daily.    [provider]  VOLTAREN  1 % GEL Apply 2 g topically daily as needed (joint pain). For pain 09/04/12   [provider]    Allergies: Plavix [clopidogrel bisulfate], Prinivil [lisinopril], Sulfa antibiotics, Sulfasalazine, Codeine, Metronidazole , and Clopidogrel    Review of Systems  All other systems reviewed and  are negative.   Updated Vital Signs BP (!) 114/59   Pulse 75   Temp 98.2 F (36.8 C) (Oral)   Resp 14   SpO2 100%   Physical Exam Vitals and nursing note reviewed.  Constitutional:      Appearance: She is well-developed.  HENT:     Head: Normocephalic and atraumatic.  Cardiovascular:     Rate and Rhythm: Normal rate and regular rhythm.     Heart sounds: No murmur heard. Pulmonary:     Effort: Pulmonary effort is normal. No respiratory distress.     Breath sounds: Normal breath sounds.  Abdominal:     Palpations: Abdomen is soft.     Tenderness: There is no abdominal tenderness. There is no guarding or rebound.  Musculoskeletal:        General: No tenderness.  Skin:    General: Skin is warm and dry.  Neurological:     Mental Status: She is alert and oriented to person, place, and time.     Comments: Visual fields grossly intact.  PERRL, EOMI, no asymmetry of facial movements.  5/5 strength in all four extremities with sensation to light touch intact in all four extremities.   Psychiatric:        Behavior: Behavior normal.     (all labs ordered are listed, but only abnormal results are displayed) Labs Reviewed  COMPREHENSIVE METABOLIC PANEL WITH GFR - Abnormal; Notable for the following components:      Result Value   Potassium 5.3 (*)    Chloride 96 (*)    Glucose, Bld 151 (*)    Creatinine, Ser 1.08 (*)    Calcium  10.4 (*)    GFR, Estimated 50 (*)    All other components within normal limits  CBG MONITORING, ED - Abnormal; Notable for the following components:   Glucose-Capillary 105 (*)    All other components within normal limits  TROPONIN T, HIGH SENSITIVITY - Abnormal; Notable for the following components:   Troponin T High Sensitivity 24 (*)    All other components within normal limits  TROPONIN T, HIGH SENSITIVITY - Abnormal; Notable for the following components:   Troponin T High Sensitivity 21 (*)    All other components within normal limits  CBC   URINALYSIS, ROUTINE W REFLEX MICROSCOPIC    EKG: EKG Interpretation Date/Time:  Tuesday October 30 2024 01:11:44 EST Ventricular Rate:  58 PR Interval:  179 QRS Duration:  91 QT Interval:  428 QTC Calculation: 421 R Axis:   12  Text Interpretation: Sinus rhythm Confirmed by Griselda Norris [phone]) on 10/30/2024 1:44:02 AM  Radiology: CT Angio Head Neck W WO CM Result Date: 10/30/2024 EXAM: CTA Head and Neck with Intravenous Contrast. CT Head without Contrast. CLINICAL HISTORY: 88 year old female. Acute neuro deficit, stroke suspected, severe right-sided headache. TECHNIQUE: Axial  CTA images of the head and neck performed with and without intravenous contrast. MIP reconstructed images were created and reviewed. Axial computed tomography images of the head/brain performed without intravenous contrast. Note: Per PQRS, the description of internal carotid artery percent stenosis, including 0 percent or normal exam, is based on [employer] Symptomatic Carotid Endarterectomy Trial (NASCET) criteria. Dose reduction technique was used including one or more of the following: automated exposure control, adjustment of mA and kV according to patient size, and/or iterative reconstruction. CONTRAST: With and without IV contrast; 75 mL iohexol  (OMNIPAQUE ) 350 MG/ML injection. COMPARISON: Brain MRI 08/20/2022. Head CT 09/15/2024. CTA Head and Neck 04/24/2024. FINDINGS: CT HEAD: BRAIN: No acute intraparenchymal hemorrhage. No mass lesion. No CT evidence for acute territorial infarct. No midline shift or extra-axial collection. Brain volume stable and within normal limits for age. Stable gray white differentiation, mild chronic cerebral white matter hypodensity. Calcified atherosclerosis of the skull base. VENTRICLES: No hydrocephalus. ORBITS: The orbits are unremarkable. SINUSES AND MASTOIDS: The paranasal sinuses and mastoid air cells are clear. Middle ears remain well aerated. CTA NECK: COMMON CAROTID  ARTERIES: Tortuous right CCA with no significant plaque or stenosis. Mild left CCA plaque and tortuosity without stenosis. No dissection or occlusion. INTERNAL CAROTID ARTERIES: Atherosclerosis of the right ICA origin and bulb resulting in narrowing of the right ICA origin of 1.5 mm versus 4.1 mm normally, representing 63% stenosis. This is stable since April and the vessel remains patent with no additional stenosis to the skull base. Right ICA siphon moderate calcified plaque with only mild right siphon stenosis. Proximal left ICA soft and calcified plaque resulting in less than 50% stenosis. Tortuous left ICA distal to the bulb. Left ICA siphon moderate calcified plaque with mild left siphon stenosis. No dissection or occlusion. VERTEBRAL ARTERIES: Dominant left vertebral artery, normal variant. Proximal right subclavian artery soft and calcified atherosclerosis narrows the vessel to 4.3 mm versus 8.6 mm normally, representing 50% stenosis. Right vertebral artery origin and B1 segment are patent with mild atherosclerosis and stenosis. Nondominant right vertebral artery is patent without stenosis to the skull base. Proximal left subclavian artery soft and calcified atherosclerosis narrows the vessel to 4 mm versus 7.3 mm normally, representing 45% stenosis. Calcified plaque adjacent to the left vertebral artery origin but no vertebral stenosis. Dominant and dolichoectatic left vertebral artery remains tortuous and widely patent to the skull base. No dissection or occlusion. CTA HEAD: ANTERIOR CEREBRAL ARTERIES: Right A1 segment mildly dominant, normal variant. Normal anterior communicating artery. Mild to moderate distal left ACA A1 irregularity and stenosis is stable on series 18 image 29. Other bilateral ACA branches are within normal limits. No occlusion. No aneurysm. MIDDLE CEREBRAL ARTERIES: MCA M1 segments and MCA bifurcations are patent with mild irregularity and no significant stenosis. Bilateral MCA  branches are stable and within normal limits. No occlusion. No aneurysm. POSTERIOR CEREBRAL ARTERIES: Diminutive posterior communicating arteries. Bilateral PCA branches are patent but with chronic moderate to severe right PCA P2 segment irregularity and stenosis on series 19 image 18. Contralateral mild to moderate distal left PCA P2 irregularity and stenosis is also stable. No occlusion. No aneurysm. BASILAR ARTERY: Nondominant right vertebral artery functionally terminates at PICA as before. Dominant and ectatic left vertebral V4 segment supplies the basilar. Mild left V4 calcified plaque without stenosis. Patent left PICA origin. Patent AICA origins. Mid basilar artery atherosclerosis and stenosis narrows the vessel to 1.8 mm, versus 3.3 mm normally, representing 45% stenosis. Basilar tip, SCA and PCA origins  remain normal. No occlusion. No aneurysm. OTHER: Major dural venous sinuses are enhancing and patent. Brachiocephalic artery calcified plaque without stenosis. Central lobular emphysema in the visible upper lobes. Advanced calcified aortic atherosclerosis. SOFT TISSUES: Stable nonvascular neck soft tissue spaces. No masses or lymphadenopathy. BONES: Advanced chronic cervical spine degeneration and postoperative changes. C3 through cervicothoracic junction fusion which could be in part degenerative and in part postoperative. C3 through C6 ACDF hardware appears stable. Superimposed chronic severe C1-C2 degeneration, with advanced joint space loss on the right and evidence of developing ankylosis there also. No acute osseous abnormality. IMPRESSION: 1. No acute intracranial abnormality. 2. Stable CTA head and neck since April. Head and neck atherosclerosis with no aneurysm or large vessel occlusion, but notable chronic stenoses: 3. proximal RICA 63% stenosis. Moderate to severe right PCA P2 segment stenosis. Up to Moderate stenoses Left ACA A1, Left PCA P2 segments. Mid Basilar artery and LICA less than 50%  stenoses. Proximal right subclavian artery 50% stenosis. 4. Emphysema. Electronically signed by: Helayne Hurst MD 10/30/2024 05:25 AM EST RP Workstation: [id number]     Procedures   Medications Ordered in the ED  metoCLOPramide (REGLAN) injection 10 mg (10 mg Intravenous Given 10/30/24 0140)  hydrALAZINE  (APRESOLINE ) injection 5 mg (5 mg Intravenous Given 10/30/24 0219)  prochlorperazine  (COMPAZINE ) injection 5 mg (5 mg Intravenous Given 10/30/24 0219)  iohexol  (OMNIPAQUE ) 350 MG/ML injection 75 mL (75 mLs Intravenous Contrast Given 10/30/24 0428)  magnesium sulfate IVPB 1 g 100 mL (0 g Intravenous Stopped 10/30/24 0642)  tiZANidine (ZANAFLEX) tablet 2 mg (2 mg Oral Given 10/30/24 9346)                                    Medical Decision Making Amount and/or Complexity of Data Reviewed Labs: ordered. Radiology: ordered.  Risk Prescription drug management.   Patient with history of hypertension, migraine, osteoarthritis, presents accompanied by her daughter for evaluation of progressive headache following a fall that occurred on September 20.  Headache is positional in nature.  She has no focal deficits on examination.  At time of ED arrival patient with significant hypertension.  This did rapidly improve after 1 dose of hydralazine .  She also complains of chronic left-sided chest pain since her fall.  Initial troponin is mildly elevated, repeat is downtrending.  Pain is atypical for ACS.  Doubt PE.  She was treated with medications for her headache with improvement but persistent headache.  Will trial tizanidine in the event there is a muscle spasm component.  Current picture is not consistent with subarachnoid hemorrhage or meningitis.  After treatment in the department patient has a partial improvement in symptoms, on attempt to ambulate patient with recurrent severe headache with associated dizziness.  Plan to obtain MRI to further evaluate symptoms.  Patient care transferred pending additional  imaging.     Final diagnoses:  Bad headache  Elevated troponin    ED Discharge Orders     None          Griselda Norris, MD 10/30/24 [phone]

## 2024-10-30 NOTE — ED Notes (Signed)
 Pt was able to take a few steps but immediatly complained of being dizzy and having a headache. Paramedic and MD notified.

## 2024-10-30 NOTE — ED Notes (Signed)
 Patient transported to CT

## 2024-10-30 NOTE — ED Provider Notes (Signed)
 Patient was initially seen by Dr. Griselda last night.  Please see her note.  Notified this morning that patient is hypotensive.  Patient was given IV antihypertensive medications last evening.  Pt states is feeling fine.  Asymptomatic.  Will give a small fluid bolus continue to monitor.  MRI has been completed.  Results are pending.  MRI does not show any acute finding  Patient is feeling better.  She was able to ambulate.  She is comfortable with discharge at this time   Randol Simmonds, MD 10/30/24 [phone]
